# Patient Record
Sex: Female | Born: 1945 | Race: White | Hispanic: No | Marital: Married | Smoking: Never smoker
Health system: Southern US, Community
[De-identification: ages and names within clinical notes are randomized; demographics above are authoritative.]

## PROBLEM LIST (undated history)

## (undated) DIAGNOSIS — K589 Irritable bowel syndrome without diarrhea: Secondary | ICD-10-CM

## (undated) DIAGNOSIS — E785 Hyperlipidemia, unspecified: Secondary | ICD-10-CM

## (undated) DIAGNOSIS — F32A Depression, unspecified: Secondary | ICD-10-CM

## (undated) DIAGNOSIS — C801 Malignant (primary) neoplasm, unspecified: Secondary | ICD-10-CM

## (undated) DIAGNOSIS — F329 Major depressive disorder, single episode, unspecified: Secondary | ICD-10-CM

## (undated) DIAGNOSIS — M1990 Unspecified osteoarthritis, unspecified site: Secondary | ICD-10-CM

## (undated) DIAGNOSIS — T7840XA Allergy, unspecified, initial encounter: Secondary | ICD-10-CM

## (undated) DIAGNOSIS — K3184 Gastroparesis: Secondary | ICD-10-CM

## (undated) DIAGNOSIS — Z860100 Personal history of colon polyps, unspecified: Secondary | ICD-10-CM

## (undated) DIAGNOSIS — Z8249 Family history of ischemic heart disease and other diseases of the circulatory system: Secondary | ICD-10-CM

## (undated) DIAGNOSIS — F419 Anxiety disorder, unspecified: Secondary | ICD-10-CM

## (undated) DIAGNOSIS — K219 Gastro-esophageal reflux disease without esophagitis: Secondary | ICD-10-CM

## (undated) DIAGNOSIS — E7800 Pure hypercholesterolemia, unspecified: Secondary | ICD-10-CM

## (undated) HISTORY — DX: Allergy, unspecified, initial encounter: T78.40XA

## (undated) HISTORY — DX: Anxiety disorder, unspecified: F41.9

## (undated) HISTORY — DX: Gastro-esophageal reflux disease without esophagitis: K21.9

## (undated) HISTORY — PX: BREAST BIOPSY: SHX20

## (undated) HISTORY — DX: Personal history of colonic polyps: Z86.010

## (undated) HISTORY — DX: Unspecified osteoarthritis, unspecified site: M19.90

## (undated) HISTORY — DX: Depression, unspecified: F32.A

## (undated) HISTORY — PX: BREAST SURGERY: SHX581

## (undated) HISTORY — DX: Major depressive disorder, single episode, unspecified: F32.9

## (undated) HISTORY — DX: Personal history of colon polyps, unspecified: Z86.0100

## (undated) HISTORY — PX: OTHER SURGICAL HISTORY: SHX169

## (undated) HISTORY — PX: SKIN SURGERY: SHX2413

## (undated) HISTORY — DX: Irritable bowel syndrome, unspecified: K58.9

## (undated) HISTORY — PX: BREAST EXCISIONAL BIOPSY: SUR124

## (undated) HISTORY — DX: Malignant (primary) neoplasm, unspecified: C80.1

## (undated) HISTORY — DX: Family history of ischemic heart disease and other diseases of the circulatory system: Z82.49

## (undated) HISTORY — DX: Hyperlipidemia, unspecified: E78.5

## (undated) HISTORY — PX: SKIN CANCER EXCISION: SHX779

## (undated) HISTORY — DX: Gastroparesis: K31.84

## (undated) HISTORY — DX: Pure hypercholesterolemia, unspecified: E78.00

---

## 1983-02-10 HISTORY — PX: ABDOMINAL HYSTERECTOMY: SHX81

## 1998-04-01 ENCOUNTER — Other Ambulatory Visit: Payer: Self-pay | Admitting: Dermatology

## 1999-04-11 ENCOUNTER — Other Ambulatory Visit: Admission: RE | Admit: 1999-04-11 | Discharge: 1999-04-11 | Payer: Self-pay | Admitting: Obstetrics and Gynecology

## 2001-06-01 ENCOUNTER — Other Ambulatory Visit: Admission: RE | Admit: 2001-06-01 | Discharge: 2001-06-01 | Payer: Self-pay | Admitting: Obstetrics and Gynecology

## 2002-03-24 ENCOUNTER — Ambulatory Visit (HOSPITAL_COMMUNITY): Admission: RE | Admit: 2002-03-24 | Discharge: 2002-03-24 | Payer: Self-pay | Admitting: Pulmonary Disease

## 2002-03-24 ENCOUNTER — Encounter: Payer: Self-pay | Admitting: Pulmonary Disease

## 2004-03-11 ENCOUNTER — Ambulatory Visit: Payer: Self-pay | Admitting: Pulmonary Disease

## 2004-03-28 ENCOUNTER — Ambulatory Visit: Payer: Self-pay | Admitting: Gastroenterology

## 2004-04-11 ENCOUNTER — Ambulatory Visit: Payer: Self-pay | Admitting: Gastroenterology

## 2004-11-18 ENCOUNTER — Ambulatory Visit: Payer: Self-pay | Admitting: Pulmonary Disease

## 2005-07-24 ENCOUNTER — Ambulatory Visit: Payer: Self-pay | Admitting: Pulmonary Disease

## 2005-10-21 ENCOUNTER — Ambulatory Visit: Payer: Self-pay | Admitting: Pulmonary Disease

## 2005-11-26 ENCOUNTER — Encounter: Admission: RE | Admit: 2005-11-26 | Discharge: 2005-11-26 | Payer: Self-pay | Admitting: General Surgery

## 2006-04-13 ENCOUNTER — Ambulatory Visit: Payer: Self-pay | Admitting: Pulmonary Disease

## 2006-08-30 ENCOUNTER — Ambulatory Visit: Payer: Self-pay | Admitting: Pulmonary Disease

## 2006-11-29 ENCOUNTER — Encounter: Admission: RE | Admit: 2006-11-29 | Discharge: 2006-11-29 | Payer: Self-pay | Admitting: Obstetrics and Gynecology

## 2007-02-21 DIAGNOSIS — E785 Hyperlipidemia, unspecified: Secondary | ICD-10-CM | POA: Insufficient documentation

## 2007-02-21 DIAGNOSIS — K589 Irritable bowel syndrome without diarrhea: Secondary | ICD-10-CM | POA: Insufficient documentation

## 2007-02-21 DIAGNOSIS — F411 Generalized anxiety disorder: Secondary | ICD-10-CM | POA: Insufficient documentation

## 2007-02-21 DIAGNOSIS — M1990 Unspecified osteoarthritis, unspecified site: Secondary | ICD-10-CM | POA: Insufficient documentation

## 2007-02-22 ENCOUNTER — Ambulatory Visit: Payer: Self-pay | Admitting: Pulmonary Disease

## 2007-02-24 LAB — CONVERTED CEMR LAB
ALT: 28 units/L (ref 0–35)
AST: 28 units/L (ref 0–37)
Albumin: 4.2 g/dL (ref 3.5–5.2)
Alkaline Phosphatase: 57 units/L (ref 39–117)
BUN: 9 mg/dL (ref 6–23)
Basophils Absolute: 0 10*3/uL (ref 0.0–0.1)
Basophils Relative: 0 % (ref 0.0–1.0)
Bilirubin, Direct: 0.1 mg/dL (ref 0.0–0.3)
CO2: 30 meq/L (ref 19–32)
Calcium: 9.7 mg/dL (ref 8.4–10.5)
Chloride: 103 meq/L (ref 96–112)
Cholesterol: 168 mg/dL (ref 0–200)
Creatinine, Ser: 0.9 mg/dL (ref 0.4–1.2)
Eosinophils Absolute: 0.1 10*3/uL (ref 0.0–0.6)
Eosinophils Relative: 1.9 % (ref 0.0–5.0)
GFR calc Af Amer: 82 mL/min
GFR calc non Af Amer: 68 mL/min
Glucose, Bld: 110 mg/dL — ABNORMAL HIGH (ref 70–99)
HCT: 40.3 % (ref 36.0–46.0)
HDL: 49.3 mg/dL (ref >39.0)
Hemoglobin: 14.3 g/dL (ref 12.0–15.0)
LDL Cholesterol: 89 mg/dL (ref 0–99)
Lymphocytes Relative: 34.3 % (ref 12.0–46.0)
MCHC: 35.4 g/dL (ref 30.0–36.0)
MCV: 87.6 fL (ref 78.0–100.0)
Monocytes Absolute: 0.3 10*3/uL (ref 0.2–0.7)
Monocytes Relative: 6.1 % (ref 3.0–11.0)
Neutro Abs: 2.6 10*3/uL (ref 1.4–7.7)
Neutrophils Relative %: 57.7 % (ref 43.0–77.0)
Platelets: 197 10*3/uL (ref 150–400)
Potassium: 4.6 meq/L (ref 3.5–5.1)
RBC: 4.6 M/uL (ref 3.87–5.11)
RDW: 11.9 % (ref 11.5–14.6)
Sodium: 140 meq/L (ref 135–145)
TSH: 2.7 microintl units/mL (ref 0.35–5.50)
Total Bilirubin: 0.7 mg/dL (ref 0.3–1.2)
Total CHOL/HDL Ratio: 3.4
Total Protein: 7.4 g/dL (ref 6.0–8.3)
Triglycerides: 151 mg/dL — ABNORMAL HIGH (ref 0–149)
VLDL: 30 mg/dL (ref 0–40)
WBC: 4.6 10*3/uL (ref 4.5–10.5)

## 2007-02-25 ENCOUNTER — Ambulatory Visit (HOSPITAL_COMMUNITY): Admission: RE | Admit: 2007-02-25 | Discharge: 2007-02-25 | Payer: Self-pay | Admitting: Pulmonary Disease

## 2007-03-09 ENCOUNTER — Telehealth: Payer: Self-pay | Admitting: Pulmonary Disease

## 2007-03-16 ENCOUNTER — Ambulatory Visit: Payer: Self-pay | Admitting: Pulmonary Disease

## 2007-03-16 DIAGNOSIS — D126 Benign neoplasm of colon, unspecified: Secondary | ICD-10-CM | POA: Insufficient documentation

## 2007-03-16 DIAGNOSIS — K219 Gastro-esophageal reflux disease without esophagitis: Secondary | ICD-10-CM | POA: Insufficient documentation

## 2007-09-12 ENCOUNTER — Ambulatory Visit: Payer: Self-pay | Admitting: Pulmonary Disease

## 2007-09-12 DIAGNOSIS — J309 Allergic rhinitis, unspecified: Secondary | ICD-10-CM | POA: Insufficient documentation

## 2007-10-26 ENCOUNTER — Encounter: Payer: Self-pay | Admitting: Pulmonary Disease

## 2007-11-17 ENCOUNTER — Telehealth: Payer: Self-pay | Admitting: Pulmonary Disease

## 2007-11-18 ENCOUNTER — Ambulatory Visit: Payer: Self-pay | Admitting: Pulmonary Disease

## 2007-11-19 LAB — CONVERTED CEMR LAB
ALT: 25 units/L (ref 0–35)
Albumin: 4.4 g/dL (ref 3.5–5.2)
Alkaline Phosphatase: 56 units/L (ref 39–117)
BUN: 8 mg/dL (ref 6–23)
Basophils Relative: 0.7 % (ref 0.0–3.0)
Bilirubin, Direct: 0.1 mg/dL (ref 0.0–0.3)
CO2: 27 meq/L (ref 19–32)
Eosinophils Relative: 0.9 % (ref 0.0–5.0)
GFR calc Af Amer: 82 mL/min
HCT: 36.1 % (ref 36.0–46.0)
Hemoglobin: 12.2 g/dL (ref 12.0–15.0)
Lymphocytes Relative: 22.4 % (ref 12.0–46.0)
Monocytes Absolute: 0.2 10*3/uL (ref 0.1–1.0)
Monocytes Relative: 6.2 % (ref 3.0–12.0)
Platelets: 187 10*3/uL (ref 150–400)
RBC: 4.04 M/uL (ref 3.87–5.11)
Sed Rate: 16 mm/hr (ref 0–22)
Sodium: 144 meq/L (ref 135–145)
Total Protein: 7.2 g/dL (ref 6.0–8.3)

## 2007-12-05 ENCOUNTER — Encounter: Admission: RE | Admit: 2007-12-05 | Discharge: 2007-12-05 | Payer: Self-pay | Admitting: Obstetrics and Gynecology

## 2007-12-05 ENCOUNTER — Telehealth: Payer: Self-pay | Admitting: Pulmonary Disease

## 2007-12-07 ENCOUNTER — Telehealth: Payer: Self-pay | Admitting: Gastroenterology

## 2007-12-08 ENCOUNTER — Ambulatory Visit: Payer: Self-pay | Admitting: Internal Medicine

## 2007-12-12 ENCOUNTER — Ambulatory Visit: Payer: Self-pay | Admitting: Gastroenterology

## 2007-12-12 LAB — CONVERTED CEMR LAB
Basophils Relative: 1.1 % (ref 0.0–3.0)
HCT: 38.1 % (ref 36.0–46.0)
Lipase: 31 units/L (ref 11.0–59.0)
MCHC: 33.1 g/dL (ref 30.0–36.0)
MCV: 89.4 fL (ref 78.0–100.0)
Monocytes Absolute: 0.3 10*3/uL (ref 0.1–1.0)
Monocytes Relative: 5.7 % (ref 3.0–12.0)
Neutro Abs: 3.7 10*3/uL (ref 1.4–7.7)
RBC: 4.26 M/uL (ref 3.87–5.11)
RDW: 11.7 % (ref 11.5–14.6)

## 2007-12-14 ENCOUNTER — Ambulatory Visit (HOSPITAL_COMMUNITY): Admission: RE | Admit: 2007-12-14 | Discharge: 2007-12-14 | Payer: Self-pay | Admitting: Gastroenterology

## 2008-01-02 ENCOUNTER — Ambulatory Visit: Payer: Self-pay | Admitting: Gastroenterology

## 2008-01-02 DIAGNOSIS — K3184 Gastroparesis: Secondary | ICD-10-CM | POA: Insufficient documentation

## 2008-02-21 ENCOUNTER — Encounter: Payer: Self-pay | Admitting: Pulmonary Disease

## 2008-03-12 ENCOUNTER — Ambulatory Visit: Payer: Self-pay | Admitting: Pulmonary Disease

## 2008-03-18 LAB — CONVERTED CEMR LAB
HDL: 51.8 mg/dL (ref >39.0)
LDL Cholesterol: 97 mg/dL (ref 0–99)
TSH: 2.05 microintl units/mL (ref 0.35–5.50)

## 2008-07-03 ENCOUNTER — Telehealth (INDEPENDENT_AMBULATORY_CARE_PROVIDER_SITE_OTHER): Payer: Self-pay | Admitting: *Deleted

## 2008-10-11 ENCOUNTER — Encounter: Payer: Self-pay | Admitting: Pulmonary Disease

## 2008-11-13 ENCOUNTER — Encounter (INDEPENDENT_AMBULATORY_CARE_PROVIDER_SITE_OTHER): Payer: Self-pay | Admitting: *Deleted

## 2008-12-19 ENCOUNTER — Ambulatory Visit: Payer: Self-pay | Admitting: Gastroenterology

## 2008-12-24 ENCOUNTER — Encounter: Admission: RE | Admit: 2008-12-24 | Discharge: 2008-12-24 | Payer: Self-pay | Admitting: Obstetrics and Gynecology

## 2009-03-18 ENCOUNTER — Ambulatory Visit: Payer: Self-pay | Admitting: Pulmonary Disease

## 2009-03-19 LAB — CONVERTED CEMR LAB
Basophils Relative: 0.4 % (ref 0.0–3.0)
Bilirubin, Direct: 0.1 mg/dL (ref 0.0–0.3)
CO2: 32 meq/L (ref 19–32)
Eosinophils Relative: 1.9 % (ref 0.0–5.0)
GFR calc non Af Amer: 77.21 mL/min (ref >60)
Glucose, Bld: 88 mg/dL (ref 70–99)
Hemoglobin, Urine: NEGATIVE
LDL Cholesterol: 83 mg/dL (ref 0–99)
Lymphocytes Relative: 34.3 % (ref 12.0–46.0)
Lymphs Abs: 1.3 10*3/uL (ref 0.7–4.0)
Monocytes Absolute: 0.3 10*3/uL (ref 0.1–1.0)
Monocytes Relative: 8.3 % (ref 3.0–12.0)
Neutro Abs: 2 10*3/uL (ref 1.4–7.7)
RDW: 12.1 % (ref 11.5–14.6)
Sodium: 141 meq/L (ref 135–145)
Specific Gravity, Urine: 1.01 (ref 1.000–1.030)
TSH: 1.78 microintl units/mL (ref 0.35–5.50)
Total CHOL/HDL Ratio: 3
Total Protein, Urine: NEGATIVE mg/dL
VLDL: 16.2 mg/dL (ref 0.0–40.0)
Vit D, 25-Hydroxy: 39 ng/mL (ref 30–89)
WBC: 3.7 10*3/uL — ABNORMAL LOW (ref 4.5–10.5)

## 2009-04-22 ENCOUNTER — Encounter (INDEPENDENT_AMBULATORY_CARE_PROVIDER_SITE_OTHER): Payer: Self-pay | Admitting: *Deleted

## 2009-07-12 ENCOUNTER — Encounter (INDEPENDENT_AMBULATORY_CARE_PROVIDER_SITE_OTHER): Payer: Self-pay

## 2009-07-16 ENCOUNTER — Ambulatory Visit: Payer: Self-pay | Admitting: Gastroenterology

## 2009-09-04 ENCOUNTER — Ambulatory Visit: Payer: Self-pay | Admitting: Gastroenterology

## 2009-09-07 ENCOUNTER — Encounter: Payer: Self-pay | Admitting: Gastroenterology

## 2009-09-09 ENCOUNTER — Telehealth: Payer: Self-pay | Admitting: Gastroenterology

## 2009-09-27 ENCOUNTER — Encounter: Payer: Self-pay | Admitting: Pulmonary Disease

## 2009-11-12 ENCOUNTER — Telehealth (INDEPENDENT_AMBULATORY_CARE_PROVIDER_SITE_OTHER): Payer: Self-pay | Admitting: *Deleted

## 2009-12-16 ENCOUNTER — Ambulatory Visit: Payer: Self-pay | Admitting: Pulmonary Disease

## 2009-12-25 ENCOUNTER — Telehealth (INDEPENDENT_AMBULATORY_CARE_PROVIDER_SITE_OTHER): Payer: Self-pay | Admitting: *Deleted

## 2009-12-26 ENCOUNTER — Encounter: Admission: RE | Admit: 2009-12-26 | Discharge: 2009-12-26 | Payer: Self-pay | Admitting: Obstetrics and Gynecology

## 2010-03-11 NOTE — Procedures (Signed)
Summary: Colonoscopy  Patient: Tabitha Carlson Note: All result statuses are Final unless otherwise noted.  Tests: (1) Colonoscopy (COL)   COL Colonoscopy           DONE     [facility]     [address]     [place], [place]  [address]           COLONOSCOPY PROCEDURE REPORT           PATIENT:  Tabitha Carlson, Tabitha Carlson  MR#:  [id number]     BIRTHDATE:  1946-01-20, 63 yrs. old  GENDER:  female     ENDOSCOPIST:  Judie Petit T. Russella Dar, MD, [facility]           PROCEDURE DATE:  09/04/2009     PROCEDURE:  Colonoscopy with snare polypectomy     ASA CLASS:  Class II     INDICATIONS:  1) follow-up of polyp  2) surveillance and high-risk     screening, adenomatous polyp, 04/2004.     MEDICATIONS:   Fentanyl 75 mcg IV, Versed 8 mg IV     DESCRIPTION OF PROCEDURE:   After the risks benefits and     alternatives of the procedure were thoroughly explained, informed     consent was obtained.  Digital rectal exam was performed and     revealed no abnormalities.   The LB [id number]-[id number] endoscope     was introduced through the anus and advanced to the cecum, which     was identified by both the appendix and ileocecal valve, without     limitations.  The quality of the prep was excellent, using     MoviPrep.  The instrument was then slowly withdrawn as the colon     was fully examined.     <<PROCEDUREIMAGES>>     FINDINGS:  Two polyps were found in the rectum. They were 4 mm in     size. Polyps were snared without cautery. Retrieval was     successful. Mild diverticulosis was found in the sigmoid colon. A     normal appearing cecum, ileocecal valve, and appendiceal orifice     were identified. The ascending, hepatic flexure, transverse,     splenic flexure, descending colon appeared unremarkable.     Retroflexed views in the rectum revealed internal hemorrhoids,     small.  The time to cecum =  3.33  minutes. The scope was then     withdrawn (time =  10.33  min) from the patient and the procedure  completed.           COMPLICATIONS:  None           ENDOSCOPIC IMPRESSION:     1) 4 mm, two polyps in the rectum     2) Mild diverticulosis in the sigmoid colon     3) Internal hemorrhoids           RECOMMENDATIONS:     1) Await pathology results     2) High fiber diet with liberal fluid intake.     3) Repeat Colonoscopy in 5 years pending pathology review           Johntae Broxterman T. Russella Dar, MD, [facility]           CC: Michele Mcalpine, MD           n.     Rosalie DoctorVenita Lick. Taran Hable at 09/04/2009 11:25 AM  Tabitha Carlson, Tabitha Carlson, [id number]  Note: An exclamation mark (!) indicates a result that was not dispersed into the flowsheet. Document Creation Date: 09/04/2009 11:26 AM _______________________________________________________________________  (1) Order result status: Final Collection or observation date-time: 09/04/2009 11:21 Requested date-time:  Receipt date-time:  Reported date-time:  Referring Physician:   Ordering Physician: Claudette Head [phone]) Specimen Source:  Source: Launa Grill Order Number: [id number] Lab site:   Appended Document: Colonoscopy     Procedures Next Due Date:    Colonoscopy: 08/2014

## 2010-03-11 NOTE — Progress Notes (Signed)
Summary: ear pressure  Phone Note Call from Patient Call back at Home Phone [phone]   Caller: Patient Call For: tammy parrett Summary of Call: pt has completed abx. ears no longer in pain but still stopped up "off and on" and echoes. no fever. pls advise. [facility] [address]  Initial call taken by: Tivis Ringer, CNA,  December 25, 2009 3:40 PM  Follow-up for Phone Call        Spoke with Lennon Alstrom she okayed to use block spot with TP-pt is still having ear pain and recently requested refill on abx-OV scheduled for Tomorrow at 11am with TP.Reynaldo Minium CMA  December 25, 2009 3:48 PM      Appended Document: ear pressure per SN, no appt needed.  may have medrol dose pak.  LMOM TCB to inform pt of this.    Appended Document: ear pressure > ok for medrol dose pak Medications Added MEDROL (PAK) 4 MG TABS (METHYLPREDNISOLONE) take as directed       pt never returned call.  arrived at office for appt.  spoke with patient - finished abx 4 days ago, still having right ear stuffiness "off and on" and occ muffled hearing, no pain - advised of SN's recs of the medrol dose pak d/t inflammation in the ear.  pt okay with this an verbalized her understanding.  rx sent to [facility].   Clinical Lists Changes  Medications: Added new medication of MEDROL (PAK) 4 MG TABS (METHYLPREDNISOLONE) take as directed - Signed Rx of MEDROL (PAK) 4 MG TABS (METHYLPREDNISOLONE) take as directed;  #1 x 0;  Signed;  Entered by: Boone Master CNA/MA;  Authorized by: Michele Mcalpine MD;  Method [facility]: [facility] [phone]*, [address] [place], [place], [place]  [address], Ph: [phone], Fax: [phone]    Prescriptions: MEDROL (PAK) 4 MG TABS (METHYLPREDNISOLONE) take as directed  #1 x 0   Entered by:   Boone Master CNA/MA   Authorized by:   Michele Mcalpine MD   Signed by:   Boone Master CNA/MA on 12/26/2009   Method used:   [facility]        [facility]  [address]  [phone]* (retail)       [address]       [place], [place]  [address]       Ph: [phone]       Fax: [phone]   RxID:   [id number]

## 2010-03-11 NOTE — Progress Notes (Signed)
Summary: med request   Phone Note Call from Patient Call back at Home Phone [phone]   Caller: Patient Call For: Dr. Russella Dar Reason for Call: Talk to Nurse Summary of Call: pt experiencing some "discomfort" after BM's and wants to know if Dr. Russella Dar could call her in an rx to "soothe" her rectum... [facility] [address] Initial call taken by: Vallarie Mare,  September 09, 2009 12:25 PM  Follow-up for Phone Call         Denies pain , rectal bleeding, describes as "irritated".  Patient  advised to try sitz bath two times a day and if still has burning she can try Prep H.  She is asked to call back if her symptoms don't improve. Follow-up by: Darcey Nora RN, CGRN,  September 09, 2009 1:48 PM

## 2010-03-11 NOTE — Letter (Signed)
Summary: [facility] Instructions  [facility]  [address] [place], [place] [address]   Phone: [phone]  Fax: [phone]       Tabitha Carlson    1945/08/29    MRN: [id number]        Procedure Day Dorna Bloom:  Jake Shark  07/30/09     Arrival Time:  9:00AM     Procedure Time:  10:00AM     Location of Procedure:                    _X _  [facility] (4th Floor)                        PREPARATION FOR COLONOSCOPY WITH MOVIPREP   Starting 5 days prior to your procedure 07/25/09 do not eat nuts, seeds, popcorn, corn, beans, peas,  salads, or any raw vegetables.  Do not take any fiber supplements (e.g. Metamucil, Citrucel, and Benefiber).  THE DAY BEFORE YOUR PROCEDURE         DATE: 07/29/09  DAY: MONDAY  1.  Drink clear liquids the entire day-NO SOLID FOOD  2.  Do not drink anything colored red or purple.  Avoid juices with pulp.  No orange juice.  3.  Drink at least 64 oz. (8 glasses) of fluid/clear liquids during the day to prevent dehydration and help the prep work efficiently.  CLEAR LIQUIDS INCLUDE: Water Jello Ice Popsicles Tea (sugar ok, no milk/cream) Powdered fruit flavored drinks Coffee (sugar ok, no milk/cream) Gatorade Juice: apple, white grape, white cranberry  Lemonade Clear bullion, consomm, broth Carbonated beverages (any kind) Strained chicken noodle soup Hard Candy                             4.  In the morning, mix first dose of MoviPrep solution:    Empty 1 Pouch A and 1 Pouch B into the disposable container    Add lukewarm drinking water to the top line of the container. Mix to dissolve    Refrigerate (mixed solution should be used within 24 hrs)  5.  Begin drinking the prep at 5:00 p.m. The MoviPrep container is divided by 4 marks.   Every 15 minutes drink the solution down to the next mark (approximately 8 oz) until the full liter is complete.   6.  Follow completed prep with 16 oz of clear liquid of your choice (Nothing  red or purple).  Continue to drink clear liquids until bedtime.  7.  Before going to bed, mix second dose of MoviPrep solution:    Empty 1 Pouch A and 1 Pouch B into the disposable container    Add lukewarm drinking water to the top line of the container. Mix to dissolve    Refrigerate  THE DAY OF YOUR PROCEDURE      DATE: 07/30/09  DAY: TUESDAY  Beginning at 5:00AM (5 hours before procedure):         1. Every 15 minutes, drink the solution down to the next mark (approx 8 oz) until the full liter is complete.  2. Follow completed prep with 16 oz. of clear liquid of your choice.    3. You may drink clear liquids until 8;00AM (2 HOURS BEFORE PROCEDURE).   MEDICATION INSTRUCTIONS  Unless otherwise instructed, you should take regular prescription medications with a small sip of water   as early as possible the morning  of your procedure.         OTHER INSTRUCTIONS  You will need a responsible adult at least 64 years of age to accompany you and drive you home.   This person must remain in the waiting room during your procedure.  Wear loose fitting clothing that is easily removed.  Leave jewelry and other valuables at home.  However, you may wish to bring a book to read or  an iPod/MP3 player to listen to music as you wait for your procedure to start.  Remove all body piercing jewelry and leave at home.  Total time from sign-in until discharge is approximately 2-3 hours.  You should go home directly after your procedure and rest.  You can resume normal activities the  day after your procedure.  The day of your procedure you should not:   Drive   Make legal decisions   Operate machinery   Drink alcohol   Return to work  You will receive specific instructions about eating, activities and medications before you leave.    The above instructions have been reviewed and explained to me by   Ulis Rias RN  July 16, 2009 4:00 PM     I fully understand and can  verbalize these instructions _____________________________ Date _________

## 2010-03-11 NOTE — Letter (Signed)
Summary: Patient Notice-Hyperplastic Polyps  [facility]  [address] [place], [place] [address]   Phone: [phone]  Fax: [phone]        September 07, 2009 MRN: [id number]    [facility] [address] [place], [place]  [address]    Dear Ms. 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 These types of polyps are NOT pre-cancerous.  It is my recommendation that you have a repeat colonoscopy examination in 5 years.  Should you develop new or worsening symptoms of abdominal pain, bowel habit changes or bleeding from the rectum or bowels, please schedule an evaluation with either your primary care physician or with me.  Continue treatment plan as outlined the day of your exam.  Please call us if you are having persistent problems or have questions about your condition that have not been fully answered at this time.  Sincerely,  Meryl Dare MD [facility]  This letter has been electronically signed by your physician.  Appended Document: Patient Notice-Hyperplastic Polyps Letter mailed 8.3.2011

## 2010-03-11 NOTE — Progress Notes (Signed)
Summary: sinus infection---rx for augmentin    Phone Note Call from Patient Call back at [phone]   Caller: Patient Call For: nadel Summary of Call: C/o sinus infection x 3 days, wants an antibiotic called in, pls advise.//[facility] [address] Initial call taken by: Darletta Moll,  November 12, 2009 10:59 AM  Follow-up for Phone Call        called and spoke with pt.  pt states she think she has a sinus infection.  Pt states symptoms started last Wednesday and haven't improved.  Pt c/o headache across front of head,  pressure behind eyes, thick "gold" nasal drainage with streaks of blood in it, watery eyes.  Pt denied chest congestion or fever.  Pt states she is using nasonex, saline spray and tylenol with no relief of symptoms. Please advise.  Thank you.  Aundra Millet Reynolds LPN  November 12, 2009 12:12 PM  allergies: asa   Additional Follow-up for Phone Call Additional follow up Details #1::        per SN, ok for Augmentin 875mg   # 14 x 0 refills.  1 by mouth two times a day.   OTC Mucinex 2 by mouth two times a day with fluids,  and saline nasal spray as needed.    LMOM to inform pt of SN's recs. Marland Kitchen Marland KitchenArman Filter LPN  November 12, 2009 2:20 PM     Additional Follow-up for Phone Call Additional follow up Details #2::    pt returned call. asked to be called back on her home phone. Tivis Ringer, CNA  November 12, 2009 2:31 PM  called pt back.  pt aware of SN's recs and rx sent to pharmacy.  Aundra Millet Reynolds LPN  November 12, 2009 2:42 PM   New/Updated Medications: AUGMENTIN 875-125 MG TABS (AMOXICILLIN-POT CLAVULANATE) Take 1 tablet by mouth two times a day Prescriptions: AUGMENTIN 875-125 MG TABS (AMOXICILLIN-POT CLAVULANATE) Take 1 tablet by mouth two times a day  #14 x 0   Entered by:   Arman Filter LPN   Authorized by:   Michele Mcalpine MD   Signed by:   Arman Filter LPN on 11/91/4782   Method used:   [facility]        [facility]  [address] [phone]* (retail)       [address]       [place], [place]  [address]       Ph: [phone]       Fax: [phone]   RxID:   [id number]

## 2010-03-11 NOTE — Assessment & Plan Note (Signed)
Summary: CPX/ FASTING/ MBW   Primary Care Provider:  Alroy Dust MD   CC:  Yearly ROV & CPX....  History of Present Illness: [name] here for a follow up visit... she has multiple medical problems as noted below...     ~  March 12, 2008:  she returns w/ gastroparesis symptoms much improved w/ dietary adjustment & lost 6# in the process... feels well, no new complaints or concerns... she's had both Flu shots this yr and requests refills for Protonix and Lipitor...   ~  March 18, 2009:  she's had a good year- GI symptoms remain controlled w/ Protonix & diet regimen...  stable on Lipitor20 & low fat diet...  c/o post nasal drip- using saline + nasonex, rec adding Mucinex + Astelin... she had the 2010 Flu vaccine in Sep10.    Current Problem List:  ALLERGIC RHINITIS (ICD-477.9) - on NASONEX Prn + OTC meds as needed... we discussed using an antihist (eg- Zyrtek) Qam, saline during the day, and Nasonex Qhs... trial of Astepro for drip.  FAMILY HISTORY OF ISCHEMIC HEART DISEASE (ICD-V17.3) - takes ASA 1/d... NuclearStressTest negative in 2/05- no ischemia & EF=71%... she is active w/ Yoga, treadmill, grandkids etc- denies HA, fatigue, visual changes, CP, palipit, dizziness, syncope, dyspnea, edema, etc...   HYPERLIPIDEMIA (ICD-272.4) - on LIPITOR 20mg /d and tol well...   ~  FLP 02/22/07 showed TChol 168, TG 151, HDL 49, LDL 89  ~  FLP 2/10 showed TChol 173, TG 122, HDL 52, LDL 97  ~  FLP 2/11 showed TChol 158, TG 81, HDL 59, LDL 83  GERD (ICD-530.81) & GASTROPARESIS - on PROTONIX 40mg /d & Phenergan Prn (she gets occas nausea)...  ~  10/09 eval by DrStark w/ EGD= normal, and Gastric Emptying Scan +for gastroparesis... Rx w/ dietary adjustment and improved...  ~  11/10: she had f/u DrStark- stable, continue Protonix Rx...  IBS (ICD-564.1) & COLONIC POLYPS (ICD-211.3) - last colonoscopy 3/06 by DrStark showed several 2-28mm polyps (path=tubular adenoms) f/u planned 17yrs & due  3/11...  DEGENERATIVE JOINT DISEASE (ICD-715.90) - uses ADVIL OTC w/ some relief... her right shoulder pain improved after she changed mattresses...  ANXIETY (ICD-300.00) - on ALPRAZOLAM 0.5mg  as needed.  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  HEALTH MAINTENANCE:  GYN= DrRomaine and up-to-date on the needed tests... PAP s/p hyst (done Oct09), [facility]), & BMD ([facility] & last in ?2007- reported WNL)... she takes calcium gummies (intol to caltrate etc), MVI, & Vit D 1000 u daily...      Allergies: 1)  ! * Asprin  Comments:  Nurse/Medical Assistant: The patient's medications and allergies were reviewed with the patient and were updated in the Medication and Allergy Lists.  Past History:  Past Medical History:  Hx of ALLERGIC RHINITIS (ICD-477.9) FAMILY HISTORY OF ISCHEMIC HEART DISEASE (ICD-V17.3) HYPERLIPIDEMIA (ICD-272.4) GERD (ICD-530.81) GASTROPARESIS (ICD-536.3) IBS (ICD-564.1) COLONIC POLYPS (ICD-211.3) DEGENERATIVE JOINT DISEASE (ICD-715.90) ANXIETY (ICD-300.00) BLOOD DONOR, OTHER (ICD-V59.09)  Past Surgical History: S/P Hysterectomy S/P left breast biopsy- benign x 2  Family History: Reviewed history from 01/02/2008 and no changes required. mother  died age 85  w/ MI father died age 34 in a MVA 3Sibs- 1Bro died age 78 drowned  2Sis are well Family History of Breast Cancer: Maternal Aunt x 2 Family History of Esophageal Cancer: Maternal Uncle  Social History: Reviewed history from 01/02/2008 and no  changes required. 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  Alcohol Use - no Daily Caffeine Use-1 drink daily Illicit Drug Use - no Patient gets regular exercise.  Review of Systems       The patient complains of nasal congestion and hay fever.  The patient denies fever, chills, sweats, anorexia, fatigue, weakness, malaise, weight loss, sleep  disorder, blurring, diplopia, eye irritation, eye discharge, vision loss, eye pain, photophobia, earache, ear discharge, tinnitus, decreased hearing, nosebleeds, sore throat, hoarseness, chest pain, palpitations, syncope, dyspnea on exertion, orthopnea, PND, peripheral edema, cough, dyspnea at rest, excessive sputum, hemoptysis, wheezing, pleurisy, nausea, vomiting, diarrhea, constipation, change in bowel habits, abdominal pain, melena, hematochezia, jaundice, gas/bloating, indigestion/heartburn, dysphagia, odynophagia, dysuria, hematuria, urinary frequency, urinary hesitancy, nocturia, incontinence, back pain, joint pain, joint swelling, muscle cramps, muscle weakness, stiffness, arthritis, sciatica, restless legs, leg pain at night, leg pain with exertion, rash, itching, dryness, suspicious lesions, paralysis, paresthesias, seizures, tremors, vertigo, transient blindness, frequent falls, frequent headaches, difficulty walking, depression, anxiety, memory loss, confusion, cold intolerance, heat intolerance, polydipsia, polyphagia, polyuria, unusual weight change, abnormal bruising, bleeding, enlarged lymph nodes, urticaria, allergic rash, and recurrent infections.    Vital Signs:  Patient profile:   65 year old female Height:      66 inches Weight:      148 pounds O2 Sat:      99 % on Room air Temp:     96.8 degrees F oral Pulse rate:   68 / minute BP sitting:   122 / 78  (left arm) Cuff size:   regular  Vitals Entered By: Randell Loop CMA (March 18, 2009 8:58 AM)  O2 Sat at Rest %:  99 O2 Flow:  Room air  Physical Exam  Additional Exam:  WD, WN, [name] in NAD... GENERAL:  Alert & oriented; pleasant & cooperative... HEENT:  [place]/AT, EOM-wnl, PERRLA, sl left ptosis, EACs-clear, TMs-wnl, NOSE-clear, THROAT-clear & wnl. NECK:  Supple w/ full ROM; no JVD; normal carotid impulses w/o bruits; no thyromegaly or nodules palpated; no lymphadenopathy. CHEST:  Clear to P & A; without wheezes/  rales/ or rhonchi. HEART:  Regular Rhythm; without murmurs/ rubs/ or gallops. ABDOMEN:  Soft & non-tender; normal bowel sounds; no organomegaly or masses detected. EXT: without deformities, min arthritic changes; no varicose veins/ venous insuffic/ or edema. NEURO:  CN's intact; no focal neuro deficits... DERM:  No lesions noted; no rash, no petechiae etc...    CXR  Procedure date:  03/18/2009  Findings:      CHEST - 2 VIEW Comparison: 03/16/2007   Findings: Mild peribronchial thickening, similar to prior study. Heart and mediastinal contours are within normal limits.  No focal opacities or effusions.  No acute bony abnormality.   IMPRESSION: No acute findings or change.   Read By:  Charlett Nose,  M.D.   EKG  Procedure date:  03/18/2009  Findings:      Normal sinus rhythm with rate of:  78/ min... Tracing is WNL, NAD...  SN   MISC. Report  Procedure date:  03/18/2009  Findings:      Lipid Panel (LIPID)   Cholesterol               158 mg/dL                   1-610   Triglycerides             81.0 mg/dL  0.0-149.0   HDL                       04.54 mg/dL                 >09.81   LDL Cholesterol           83 mg/dL                    1-91  BMP (METABOL)   Sodium                    141 mEq/L                   135-145   Potassium                 4.2 mEq/L                   3.5-5.1   Chloride                  105 mEq/L                   96-112   Carbon Dioxide            32 mEq/L                    19-32   Glucose                   88 mg/dL                    47-82   BUN                       11 mg/dL                    9-56   Creatinine                0.8 mg/dL                   2.1-3.0   Calcium                   9.1 mg/dL                   8.6-57.8   GFR                       77.21 mL/min                >60  Hepatic/Liver Function Panel (HEPATIC)   Total Bilirubin           0.3 mg/dL                   4.6-9.6   Direct Bilirubin          0.1 mg/dL                    2.9-5.2   Alkaline Phosphatase      55 U/L                      39-117   AST                       27 U/L  0-37   ALT                       25 U/L                      0-35   Total Protein             7.0 g/dL                    5.7-8.4   Albumin                   4.1 g/dL                    6.9-6.2  Comments:      CBC Platelet w/Diff (CBCD)   White Cell Count     [L]  3.7 K/uL                    4.5-10.5   Red Cell Count            4.24 Mil/uL                 3.87-5.11   Hemoglobin                13.1 g/dL                   95.2-84.1   Hematocrit                38.8 %                      36.0-46.0   MCV                       91.6 fl                     78.0-100.0   Platelet Count            173.0 K/uL                  150.0-400.0   Neutrophil %              55.1 %                      43.0-77.0   Lymphocyte %              34.3 %                      12.0-46.0   Monocyte %                8.3 %                       3.0-12.0   Eosinophils%              1.9 %                       0.0-5.0   Basophils %               0.4 %                       0.0-3.0  TSH (TSH)   FastTSH  1.78 uIU/mL                 0.35-5.50  UDip w/Micro (URINE)   Color                     LT. YELLOW   Clarity                   CLEAR                       Clear   Specific Gravity          1.010                       1.000 - 1.030   Urine Ph                  6.5                         5.0-8.0   Protein                   NEGATIVE                    Negative   Urine Glucose             NEGATIVE                    Negative   Ketones                   NEGATIVE                    Negative   Urine Bilirubin           NEGATIVE                    Negative   Blood                     NEGATIVE                    Negative  Urobilinogen              0.2                         0.0 - 1.0   Leukocyte Esterace        NEGATIVE                    Negative   Nitrite                    NEGATIVE                    Negative   Urine Epith               Rare(0-4/hpf)               Rare(0-4/hpf)   Urine Bacteria            Rare(<10/hpf)               None  Vitamin D (25-Hydroxy) (14782)  Vitamin D (25-Hydroxy)                             39  ng/mL                    30-89   Impression & Recommendations:  Problem # 1:  PHYSICAL EXAMINATION (ICD-V70.0) Good general health... Orders: EKG w/ Interpretation (93000) T-2 View CXR (71020TC) TLB-Lipid Panel (80061-LIPID) TLB-BMP (Basic Metabolic Panel-BMET) (80048-METABOL) TLB-Hepatic/Liver Function Pnl (80076-HEPATIC) TLB-CBC Platelet - w/Differential (85025-CBCD) TLB-TSH (Thyroid Stimulating Hormone) (84443-TSH) TLB-Udip w/ Micro (81001-URINE) T-Vitamin D (25-Hydroxy) (69629-52841)  Problem # 2:  Hx of ALLERGIC RHINITIS (ICD-477.9) Add Astepro trial for drip... Her updated medication list for this problem includes:    Nasonex 50 Mcg/act Susp (Mometasone furoate) ..... Use one spray in each nostril two times a day    Astepro 0.15 % Soln (Azelastine hcl) .Marland Kitchen... 2 sp in each nostril two times a day as directed...    Promethazine Hcl 25 Mg Tabs (Promethazine hcl) .Marland Kitchen... Take 1/2 to 1 tab by mouth q 4 h as needed for nausea...  Problem # 3:  HYPERLIPIDEMIA (ICD-272.4) Doing satis on the Lip20... Her updated medication list for this problem includes:    Lipitor 20 Mg Tabs (Atorvastatin calcium) .Marland Kitchen... Take 1 tablet by mouth once a day  Problem # 4:  GERD (ICD-530.81) She has GERD & GASTROPARESIS- on diet + Protonix & doing well-  continue same,. Her updated medication list for this problem includes:    Protonix 40 Mg Tbec (Pantoprazole sodium) .Marland Kitchen... 1 tablet  once a day 30 min before meals  Problem # 5:  COLONIC POLYPS (ICD-211.3) Due for colon from DrStark 3/11...  Problem # 6:  DEGENERATIVE JOINT DISEASE (ICD-715.90) She uses Advil Prn... Her updated medication list for this problem includes:    Advil 200 Mg Tabs  (Ibuprofen) .Marland Kitchen... As needed  Problem # 7:  OTHER MEDICAL PROBLEMS AS NOTED>>>  Complete Medication List: 1)  Nasonex 50 Mcg/act Susp (Mometasone furoate) .... Use one spray in each nostril two times a day 2)  Astepro 0.15 % Soln (Azelastine hcl) .... 2 sp in each nostril two times a day as directed... 3)  Lipitor 20 Mg Tabs (Atorvastatin calcium) .... Take 1 tablet by mouth once a day 4)  Protonix 40 Mg Tbec (Pantoprazole sodium) .Marland Kitchen.. 1 tablet  once a day 30 min before meals 5)  Promethazine Hcl 25 Mg Tabs (Promethazine hcl) .... Take 1/2 to 1 tab by mouth q 4 h as needed for nausea.Marland KitchenMarland Kitchen 6)  Climara 0.05 Mg/24hr Ptwk (Estradiol) .... Use once a week 7)  Advil 200 Mg Tabs (Ibuprofen) .... As needed 8)  Calcium 600 1500 Mg Tabs (Calcium carbonate) .... Take 1 tablet by mouth once a day 9)  Multivitamins Tabs (Multiple vitamin) .... Take 1 tablet by mouth once a day 10)  Alprazolam 0.5 Mg Tabs (Alprazolam) .... Take 1/2 to 1 tab by mouth three times a day as needed for nerves... 11)  Viactiv 500-500-40 Mg-unt-mcg Chew (Calcium-vitamin d-vitamin k) .Marland Kitchen.. 1 by mouth two times a day 12)  Stool Softner  .Marland Kitchen.. 1 by mouth two times a day 13)  Activia  .... Once daily  Patient Instructions: 1)  Today we updated your med list- see below.... 2)  We refilled your meds for 2011... 3)  For your post-nasal drip:  try the OTC Claritin or Zyrtek in the AM;  use the ASTEPRO (sample) 2 sp in each nostril twice daily;  use the [facility] MAX one tab two times a day w/ fluids;  and continue your Saline & Nasonex... whew!*?!* 4)  Today we did your follow up CXR, EKG, & fasting blood work... please call the "phone tree" in a few days for your lab results.Marland KitchenMarland Kitchen 5)  Keep up the great job w/ diet + exercise!!! 6)  Call for any problems.Marland KitchenMarland Kitchen 7)  Please schedule a follow-up appointment in 1 year. Prescriptions: ASTEPRO 0.15 % SOLN (AZELASTINE HCL) 2 sp in each nostril two times a day as directed...  #1 x prn   Entered and  Authorized by:   Michele Mcalpine MD   Signed by:   Michele Mcalpine MD on 03/18/2009   Method used:   Print then Give to Patient   RxID:   [id number] ALPRAZOLAM 0.5 MG  TABS (ALPRAZOLAM) Take 1/2 to 1 tab by mouth three times a day as needed for nerves...  #90 x prn   Entered and Authorized by:   Michele Mcalpine MD   Signed by:   Michele Mcalpine MD on 03/18/2009   Method used:   Print then Give to Patient   RxID:   [id number] PROTONIX 40 MG  TBEC (PANTOPRAZOLE SODIUM) 1 tablet  once a day 30 min before meals  #30 x prn   Entered and Authorized by:   Michele Mcalpine MD   Signed by:   Michele Mcalpine MD on 03/18/2009   Method used:   Print then Give to Patient   RxID:   [id number] LIPITOR 20 MG TABS (ATORVASTATIN CALCIUM) Take 1 tablet by mouth once a day  #30 x prn   Entered and Authorized by:   Michele Mcalpine MD   Signed by:   Michele Mcalpine MD on 03/18/2009   Method used:   Print then Give to Patient   RxID:   [id number] NASONEX 50 MCG/ACT  SUSP (MOMETASONE FUROATE) use one spray in each nostril two times a day  #1 x prn   Entered and Authorized by:   Michele Mcalpine MD   Signed by:   Michele Mcalpine MD on 03/18/2009   Method used:   Print then Give to Patient   RxID:   [id number]    CardioPerfect ECG  ID: [id number] Patient: Tabitha Carlson, Tabitha Carlson [facility] DOB: 11-05-1945 Age: 65 Years Old Sex: Female Race: White Physician: nadel,s Technician: Randell Loop CMA Height: 66 Weight: 148 Status: Unconfirmed Past Medical History:  Hx of ALLERGIC RHINITIS (ICD-477.9) FAMILY HISTORY OF ISCHEMIC HEART DISEASE (ICD-V17.3) HYPERLIPIDEMIA (ICD-272.4) GERD (ICD-530.81) IBS (ICD-564.1) COLONIC POLYPS (ICD-211.3)/ADENOMATOUS DEGENERATIVE JOINT DISEASE (ICD-715.90) ANXIETY (ICD-300.00) GASTROPARESIS  Recorded: 03/18/2009 09:16 AM P/PR: 107 ms / 165 ms - Heart rate (maximum exercise) QRS: 80 QT/QTc/QTd: 380 ms / 412 ms / 51 ms - Heart rate (maximum exercise)  P/QRS/T axis: 82 deg  / 76 deg / 69 deg - Heart rate (maximum exercise)  Heartrate: 78 bpm  Interpretation:  Normal sinus rhythm with rate of:  78/ min... Tracing is WNL, NAD...  SN

## 2010-03-11 NOTE — Miscellaneous (Signed)
Summary: Flu Vaccination/[facility]  Flu Vaccination/[facility]   Imported By: Sherian Rein 10/03/2009 07:48:41  _____________________________________________________________________  External Attachment:    Type:   Image     Comment:   External Document

## 2010-03-11 NOTE — Miscellaneous (Signed)
Summary: Lec previsit  Clinical Lists Changes  Medications: Added new medication of MOVIPREP 100 GM  SOLR (PEG-KCL-NACL-NASULF-NA ASC-C) As per prep instructions. - Signed Rx of MOVIPREP 100 GM  SOLR (PEG-KCL-NACL-NASULF-NA ASC-C) As per prep instructions.;  #1 x 0;  Signed;  Entered by: Ulis Rias RN;  Authorized by: Meryl Dare MD [facility];  Method used: [id number] [address], [place], [place], [place]  [address], Ph: [phone], Fax: [phone]    Prescriptions: MOVIPREP 100 GM  SOLR (PEG-KCL-NACL-NASULF-NA ASC-C) As per prep instructions.  #1 x 0   Entered by:   Ulis Rias RN   Authorized by:   Meryl Dare MD [facility]   Signed by:   Ulis Rias RN on 07/16/2009   Method used:   [facility] [id number]       [address]       [place], [place]  [address]       Ph: [phone]       Fax: [phone]   RxID:   [id number]

## 2010-03-11 NOTE — Letter (Signed)
Summary: Colonoscopy Letter  [facility]  [address] [place], [place] [address]   Phone: [phone]  Fax: [phone]      April 22, 2009 MRN: [id number]   [facility] [address] [place], [place]  [address]   Dear Ms. Bednarczyk,   According to your medical record, it is time for you to schedule a Colonoscopy. The American Cancer Society recommends this procedure as a method to detect early colon cancer. Patients with a family history of colon cancer, or a personal history of colon polyps or inflammatory bowel disease are at increased risk.  This letter has beeen generated based on the recommendations made at the time of your procedure. If you feel that in your particular situation this may no longer apply, please contact our office.  Please call our office at [phone] to schedule this appointment or to update your records at your earliest convenience.  Thank you for cooperating with [place] to provide you with the very best care possible.   Sincerely,  Judie Petit T. Russella Dar, M.D.  [facility] [phone]

## 2010-03-11 NOTE — Assessment & Plan Note (Signed)
Summary: Acute NP office visit - right ear pain   Copy to:  n/a Primary Provider/Referring Provider:  Alroy Dust MD   CC:  right ear discomfort and muffled hearing x1week and had sinus infection 1 month and still having sinus pressure with some pinkish nasal drainage.Marland Kitchen  History of Present Illness: 65 yo with known hx of Hyperlipidemia.   December 16, 2009 --Presents for an acute office visit. Complains of right ear discomfort and muffled hearing x1week, had sinus infection 1 month and still having sinus pressure with some pinkish nasal drainage. Recently returned from flight from [facility]. Ear pain is getting worse. Had sinus infection  ~6 weeks ago, resolved w/ augmentin. Denies chest pain, dyspnea, orthopnea, hemoptysis, fever, n/v/d, edema, headache.   Medications Prior to Update: 1)  Nasonex 50 Mcg/act  Susp (Mometasone Furoate) .... Use One Spray in Each Nostril Two Times A Day 2)  Astepro 0.15 % Soln (Azelastine Hcl) .... 2 Sp in Each Nostril Two Times A Day As Directed... 3)  Lipitor 20 Mg Tabs (Atorvastatin Calcium) .... Take 1 Tablet By Mouth Once A Day 4)  Protonix 40 Mg  Tbec (Pantoprazole Sodium) .Marland Kitchen.. 1 Tablet  Once A Day 30 Min Before Meals 5)  Climara 0.05 Mg/24hr  Ptwk (Estradiol) .... Use Once A Week 6)  Calcium 600 1500 Mg Tabs (Calcium Carbonate) .... Take 1 Tablet By Mouth Once A Day 7)  Viactiv 500-500-40 Mg-Unt-Mcg Chew (Calcium-Vitamin D-Vitamin K) .Marland Kitchen.. 1 By Mouth Two Times A Day 8)  Multivitamins   Tabs (Multiple Vitamin) .... Take 1 Tablet By Mouth Once A Day 9)  Alprazolam 0.5 Mg  Tabs (Alprazolam) .... Take 1/2 To 1 Tab By Mouth Three Times A Day As Needed For Nerves... 10)  Promethazine Hcl 25 Mg Tabs (Promethazine Hcl) .... Take 1/2 To 1 Tab By Mouth Q 4 H As Needed For Nausea... 11)  Advil 200 Mg Tabs (Ibuprofen) .... As Needed 12)  Stool Softner .Marland Kitchen.. 1 By Mouth Two Times A Day 13)  Activia .... Once Daily 14)  Augmentin 875-125 Mg Tabs (Amoxicillin-Pot  Clavulanate) .... Take 1 Tablet By Mouth Two Times A Day  Current Medications (verified): 1)  Nasonex 50 Mcg/act  Susp (Mometasone Furoate) .... Use One Spray in Each Nostril Two Times A Day 2)  Lipitor 20 Mg Tabs (Atorvastatin Calcium) .... Take 1 Tablet By Mouth Once A Day 3)  Protonix 40 Mg  Tbec (Pantoprazole Sodium) .Marland Kitchen.. 1 Tablet  Once A Day 30 Min Before Meals 4)  Promethazine Hcl 25 Mg Tabs (Promethazine Hcl) .... Take 1/2 To 1 Tab By Mouth Q 4 H As Needed For Nausea.Marland KitchenMarland Kitchen 5)  Climara 0.05 Mg/24hr  Ptwk (Estradiol) .... Use Once A Week 6)  Advil 200 Mg Tabs (Ibuprofen) .... As Needed 7)  Calcium 600 1500 Mg Tabs (Calcium Carbonate) .... Take 1 Tablet By Mouth Once A Day 8)  Multivitamins   Tabs (Multiple Vitamin) .... Take 1 Tablet By Mouth Once A Day 9)  Alprazolam 0.5 Mg  Tabs (Alprazolam) .... Take 1/2 To 1 Tab By Mouth Three Times A Day As Needed For Nerves... 10)  Viactiv 500-500-40 Mg-Unt-Mcg Chew (Calcium-Vitamin D-Vitamin K) .Marland Kitchen.. 1 By Mouth Two Times A Day 11)  Stool Softener 100 Mg Caps (Docusate Sodium) .... Take 1 Capsule By Mouth Two Times A Day  Allergies: 1)  ! * Asprin 2)  ! 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  Alcohol Use - no Daily Caffeine Use-1 drink daily Illicit Drug Use - no Patient gets regular exercise.  Risk Factors: Exercise: yes (01/13/2008)  Risk Factors: Smoking Status: never (03/12/2008)  Review of Systems      See HPI  Vital Signs:  Patient profile:   65 year old female Height:      66 inches Weight:      149 pounds BMI:     24.14 O2 Sat:      97 % on Room air Temp:     97.2 degrees F oral Pulse rate:   90 / minute BP sitting:   130 / 72  (left arm) Cuff size:   regular  Vitals Entered By: Boone Master CNA/MA (December 16, 2009 10:08 AM)  O2 Flow:  Room air CC: right ear discomfort and muffled hearing x1week, had sinus infection 1 month and still having sinus pressure with some pinkish nasal drainage. Is Patient Diabetic? No Comments Medications reviewed with patient Daytime contact number verified with patient. Boone Master CNA/MA  December 16, 2009 10:08 AM    Physical Exam  Additional Exam:  WD, WN, [name] in NAD... GENERAL:  Alert & oriented; pleasant & cooperative... HEENT:  [place]/AT, EAC clear,  TMs- on right red, swollen, left clear/wnl, NOSE-clear discharge,  THROAT-clear & wnl. NECK:  Supple w/ full ROM; no JVD; normal carotid impulses w/o bruits; no thyromegaly or nodules palpated; no lymphadenopathy. CHEST:  Clear to P & A; without wheezes/ rales/ or rhonchi. HEART:  Regular Rhythm; without murmurs/ rubs/ or gallops. ABDOMEN:  Soft & non-tender; normal bowel sounds; no organomegaly or masses detected. EXT: without deformities, min arthritic changes; no varicose veins/ venous insuffic/ or edema.     Impression & Recommendations:  Problem # 1:  OTITIS MEDIA, ACUTE, RIGHT (ICD-382.9)  Omnicef 300mg  two times a day for 7 days.  Allegra 180mg  once daily for 3-5 days for sinus congestion Saline nasal rinses as needed  Please contact office for sooner follow up if symptoms do not improve or worsen  follow up  Dr. Kriste Basque as scheduled  The following medications were removed from the medication list:    Augmentin 875-125 Mg Tabs (Amoxicillin-pot clavulanate) .Marland Kitchen... Take 1 tablet by mouth two times a day Her updated medication list for this problem includes:    Advil 200 Mg Tabs (Ibuprofen) .Marland Kitchen... As needed    Cefdinir 300 Mg Caps (Cefdinir) .Marland Kitchen... 1 by mouth two times a day  Orders: Est. Patient Level III ([address])  Medications Added to Medication List This Visit: 1)  Stool Softener 100 Mg Caps (Docusate sodium) .... Take 1 capsule by mouth two times a day 2)  Cefdinir 300 Mg Caps (Cefdinir) .Marland Kitchen.. 1 by mouth two times a day  Complete Medication List: 1)  Nasonex 50 Mcg/act Susp (Mometasone furoate) .... Use  one spray in each nostril two times a day 2)  Lipitor 20 Mg Tabs (Atorvastatin calcium) .... Take 1 tablet by mouth once a day 3)  Protonix 40 Mg Tbec (Pantoprazole sodium) .Marland Kitchen.. 1 tablet  once a day 30 min before meals 4)  Climara 0.05 Mg/24hr Ptwk (Estradiol) .... Use once a week 5)  Calcium 600 1500 Mg Tabs (Calcium carbonate) .... Take 1 tablet by mouth once a day 6)  Viactiv 500-500-40 Mg-unt-mcg Chew (Calcium-vitamin d-vitamin k) .Marland Kitchen.. 1 by mouth two times a day 7)  Multivitamins Tabs (Multiple vitamin) .... Take 1 tablet by mouth once a day 8)  Alprazolam 0.5 Mg Tabs (Alprazolam) .... Take 1/2 to 1 tab by mouth three times a day as needed for nerves.Marland KitchenMarland Kitchen 9)  Stool Softener 100 Mg Caps (Docusate sodium) .... Take 1 capsule by mouth two times a day 10)  Promethazine Hcl 25 Mg Tabs (Promethazine hcl) .... Take 1/2 to 1 tab by mouth q 4 h as needed for nausea... 11)  Advil 200 Mg Tabs (Ibuprofen) .... As needed 12)  Cefdinir 300 Mg Caps (Cefdinir) .Marland Kitchen.. 1 by mouth two times a day  Patient Instructions: 1)  Omnicef 300mg  two times a day for 7 days.  2)  Allegra 180mg  once daily for 3-5 days for sinus congestion 3)  Saline nasal rinses as needed  4)  Please contact office for sooner follow up if  symptoms do not improve or worsen  5)  follow up Dr. Kriste Basque as scheduled  Prescriptions: CEFDINIR 300 MG CAPS (CEFDINIR) 1 by mouth two times a day  #14 x 0   Entered and Authorized by:   Rubye Oaks NP   Signed by:   Tammy Parrett NP on 12/16/2009   Method used:   Electronically to        [facility]  [address] [id number] (retail)       [address]       [place], [place]  [address]       Ph: [phone]       Fax: [phone]   RxID:   [id number]    Immunization History:  Influenza Immunization History:    Influenza:  historical (10/10/2009)

## 2010-03-20 ENCOUNTER — Encounter: Payer: Self-pay | Admitting: Pulmonary Disease

## 2010-03-20 ENCOUNTER — Ambulatory Visit (INDEPENDENT_AMBULATORY_CARE_PROVIDER_SITE_OTHER): Payer: BC Managed Care – PPO | Admitting: Pulmonary Disease

## 2010-03-20 ENCOUNTER — Other Ambulatory Visit: Payer: Self-pay | Admitting: Pulmonary Disease

## 2010-03-20 ENCOUNTER — Other Ambulatory Visit: Payer: BC Managed Care – PPO

## 2010-03-20 ENCOUNTER — Ambulatory Visit (INDEPENDENT_AMBULATORY_CARE_PROVIDER_SITE_OTHER)
Admission: RE | Admit: 2010-03-20 | Discharge: 2010-03-20 | Disposition: A | Discharge status: Home / Self Care | Payer: BC Managed Care – PPO | Source: Ambulatory Visit | Attending: Pulmonary Disease | Admitting: Pulmonary Disease

## 2010-03-20 DIAGNOSIS — Z0000 Encounter for general adult medical examination without abnormal findings: Secondary | ICD-10-CM

## 2010-03-20 DIAGNOSIS — Z23 Encounter for immunization: Secondary | ICD-10-CM

## 2010-03-20 LAB — LIPID PANEL
HDL: 56.9 mg/dL (ref >39.00)
LDL Cholesterol: 90 mg/dL (ref 0–99)
Triglycerides: 159 mg/dL — ABNORMAL HIGH (ref 0.0–149.0)
VLDL: 31.8 mg/dL (ref 0.0–40.0)

## 2010-03-20 LAB — CBC WITH DIFFERENTIAL/PLATELET
Basophils Absolute: 0 10*3/uL (ref 0.0–0.1)
Eosinophils Absolute: 0.1 10*3/uL (ref 0.0–0.7)
Eosinophils Relative: 3.1 % (ref 0.0–5.0)
Lymphocytes Relative: 35.2 % (ref 12.0–46.0)
Lymphs Abs: 1.5 10*3/uL (ref 0.7–4.0)
Monocytes Absolute: 0.3 10*3/uL (ref 0.1–1.0)
Monocytes Relative: 7.9 % (ref 3.0–12.0)
Neutro Abs: 2.3 10*3/uL (ref 1.4–7.7)
Neutrophils Relative %: 53.4 % (ref 43.0–77.0)
Platelets: 180 10*3/uL (ref 150.0–400.0)
RDW: 12.9 % (ref 11.5–14.6)
WBC: 4.3 10*3/uL — ABNORMAL LOW (ref 4.5–10.5)

## 2010-03-20 LAB — URINALYSIS, ROUTINE W REFLEX MICROSCOPIC
Bilirubin Urine: NEGATIVE
Leukocytes, UA: NEGATIVE
Specific Gravity, Urine: <=1.005 (ref 1.000–1.030)
Urine Glucose: NEGATIVE
Urobilinogen, UA: 0.2 (ref 0.0–1.0)
pH: 6.5 (ref 5.0–8.0)

## 2010-03-20 LAB — HEPATIC FUNCTION PANEL
AST: 28 U/L (ref 0–37)
Bilirubin, Direct: 0 mg/dL (ref 0.0–0.3)
Total Bilirubin: 0.5 mg/dL (ref 0.3–1.2)

## 2010-03-20 LAB — BASIC METABOLIC PANEL: Chloride: 98 mEq/L (ref 96–112)

## 2010-04-02 NOTE — Assessment & Plan Note (Signed)
Summary: cpx/ fasting//apc   Primary Care Provider:  Alroy Dust MD   CC:  Yearly ROV & CPX....  History of Present Illness: [name] here for a follow up visit... she has multiple medical problems as noted below...     ~  [name]:  she returns w/ gastroparesis symptoms much improved w/ dietary adjustment & lost 6# in the process... feels well, no new complaints or concerns... she's had both Flu shots this yr and requests refills for Protonix and Lipitor...  ~  EAV40:  she's had a good year- GI symptoms remain controlled w/ Protonix & diet regimen...  stable on Lipitor20 & low fat diet...  c/o post nasal drip- using saline + nasonex, rec adding Mucinex + Astelin... she had the 2010 Flu vaccine in Sep10.   ~  March 20, 2010:  Tabitha Carlson has had another good yr- feeling well w/o new complaints or concerns...  she notes some incr in anxiety & has Alpraz for Prn use;  she had colonoscopy 7/11 from DrStark w/ hyperpl polyp, divertics, hem;  Lipids stable on diet + Lip20;  OK TDAP today...    Current Problem List:  ALLERGIC RHINITIS (ICD-477.9) - on NASONEX Prn + OTC meds as needed... we discussed using an antihist (eg- Zyrtek) Qam, saline during the day, and Nasonex Qhs... trial of Astepro for drip.  FAMILY HISTORY OF ISCHEMIC HEART DISEASE (ICD-V17.3) - takes ASA 1/d... NuclearStressTest negative in 2/05- no ischemia & EF=71%... she is active w/ Yoga, treadmill, grandkids etc- denies HA, fatigue, visual changes, CP, palipit, dizziness, syncope, dyspnea, edema, etc...   HYPERLIPIDEMIA (ICD-272.4) - on LIPITOR 20mg /d and tol well...   ~  FLP 02/22/07 showed TChol 168, TG 151, HDL 49, LDL 89  ~  FLP 2/10 showed TChol 173, TG 122, HDL 52, LDL 97  ~  FLP 2/11 showed TChol 158, TG 81, HDL 59, LDL 83  ~  FLP 2/12 showed TChol 179, TG 159, HDL 57, LDL 90  GERD (ICD-530.81) & GASTROPARESIS - on PROTONIX 40mg /d & Phenergan Prn (she gets occas nausea)...  ~  10/09 eval by DrStark w/ EGD= normal, and  Gastric Emptying Scan +for gastroparesis... Rx w/ dietary adjustment and improved...  ~  11/10: she had f/u DrStark- stable, continue Protonix Rx...  IBS (ICD-564.1) & COLONIC POLYPS (ICD-211.3)  ~  colonoscopy 3/06 by DrStark showed several 2-37mm polyps (path=tubular adenoms).  ~  f/u colonosco[py 7/11 showed rectal polyp= hyperplastic, divertics, hem... f/u 77yrs.  DEGENERATIVE JOINT DISEASE (ICD-715.90) - uses ADVIL OTC w/ some relief... her right shoulder pain improved after she changed mattresses...  ANXIETY (ICD-300.00) - on ALPRAZOLAM 0.5mg  as needed.  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  HEALTH MAINTENANCE:  GYN= DrRomaine and up-to-date on the needed tests... PAP s/p hyst (done Oct09), [facility]), & BMD ([facility] & last in ?2007- reported WNL)... she takes calcium gummies (intol to caltrate etc), MVI, & Vit D 1000 u daily... she gets the seasonal Flu vaccine yearly;  had Pneumovax 2002;  given TDAP 2/12...   Preventive Screening-Counseling & Management  Alcohol-Tobacco     Smoking Status: never  Caffeine-Diet-Exercise     Does Patient Exercise: yes  Allergies: 1)  ! * Asprin 2)  ! * Shellfish  Comments:  Nurse/Medical Assistant: The patient's medications and allergies were reviewed with the patient and were updated in the Medication and Allergy Lists.  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 mother  died age 22  w/  MI father died age 36 in a MVA 3Sibs- 1Bro died age 63 drowned  2Sis are well Family History of Breast Cancer: Maternal Aunt x 2 Family History of Esophageal Cancer: Maternal Uncle  Social History: Reviewed history from 01/02/2008 and no changes required. 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  Alcohol Use - no Daily Caffeine Use-1 drink daily Illicit Drug Use - no Patient gets regular exercise.  Review of Systems       The patient complains of dyspnea on exertion, gas/bloating, and anxiety.  The patient denies fever, chills, sweats, anorexia, fatigue, weakness, malaise, weight loss, sleep disorder, blurring, diplopia, eye irritation, eye discharge, vision loss, eye pain, photophobia, earache, ear discharge, tinnitus, decreased hearing, nasal congestion, nosebleeds, sore throat, hoarseness, chest pain, palpitations, syncope, orthopnea, PND, peripheral edema, cough, dyspnea at rest, excessive sputum, hemoptysis, wheezing, pleurisy, nausea, vomiting, diarrhea, constipation, change in bowel habits, abdominal pain, melena, hematochezia, jaundice, indigestion/heartburn, dysphagia, odynophagia, dysuria, hematuria, urinary frequency, urinary hesitancy, nocturia, incontinence, back pain, joint pain, joint swelling, muscle cramps, muscle weakness, stiffness, arthritis, sciatica, restless legs, leg pain at night, leg pain with exertion, rash, itching, dryness, suspicious lesions, paralysis, paresthesias, seizures, tremors, vertigo, transient blindness, frequent falls, frequent headaches, difficulty walking, depression, memory loss, confusion, cold intolerance, heat intolerance, polydipsia, polyphagia, polyuria, unusual weight change, abnormal bruising, bleeding, enlarged lymph nodes, urticaria, allergic rash, hay fever, and recurrent infections.    Vital Signs:  Patient profile:   65 year old female Height:      66 inches Weight:      154 pounds BMI:     24.95 O2 Sat:      99 % on  Room air Temp:     97.3 degrees F oral Pulse rate:   66 / minute BP sitting:   132 / 78  (right arm) Cuff size:   regular  Vitals Entered By: Randell Loop CMA (March 20, 2010 9:25 AM)  O2 Sat at Rest %:  99 O2 Flow:  Room air CC: Yearly ROV & CPX... Is Patient Diabetic? No Pain Assessment Patient in pain? no      Comments meds updated today with pt   Physical Exam  Additional Exam:  WD, WN, [name] in NAD... GENERAL:  Alert & oriented; pleasant & cooperative... HEENT:  [place]/AT, EOM-wnl, PERRLA, sl left ptosis, EACs-clear, TMs-wnl, NOSE-clear, THROAT-clear & wnl. NECK:  Supple w/ full ROM; no JVD; normal carotid impulses w/o bruits; no thyromegaly or nodules palpated; no lymphadenopathy. CHEST:  Clear to P & A; without wheezes/ rales/ or rhonchi. HEART:  Regular Rhythm; without murmurs/ rubs/ or gallops. ABDOMEN:  Soft & non-tender; normal bowel sounds; no organomegaly or masses detected. EXT: without deformities, min arthritic changes; no varicose veins/ venous insuffic/ or edema. NEURO:  CN's intact; no focal neuro deficits... DERM:  No lesions noted; no rash, no petechiae etc...    Impression & Recommendations:  Problem # 1:  PHYSICAL EXAMINATION (ICD-V70.0)  Orders: EKG w/ Interpretation (93000) T-2 View CXR (71020TC) TLB-BMP (Basic Metabolic Panel-BMET) (80048-METABOL) TLB-Hepatic/Liver Function Pnl (80076-HEPATIC) TLB-CBC  Platelet - w/Differential (85025-CBCD) TLB-Lipid Panel (80061-LIPID) TLB-TSH (Thyroid Stimulating Hormone) (84443-TSH) TLB-Udip w/ Micro (81001-URINE)  Problem # 2:  HYPERLIPIDEMIA (ICD-272.4) Stable on the Lip20 but sl incr TG - needs better low fat diet... Her updated medication list for this problem includes:    Lipitor 20 Mg Tabs (Atorvastatin calcium) .Marland Kitchen... Take 1 tablet by mouth once a day  Problem # 3:  GERD (ICD-530.81) Stable>  continue the PPI & phenergan as needed for the gastroparesisi. Her updated medication list for this  problem includes:    Protonix 40 Mg Tbec (Pantoprazole sodium) .Marland Kitchen... 1 tablet  once a day 30 min before meals  Problem # 4:  IBS (ICD-564.1) S/p colonoscopy 7/11 w/ hyperpl polyp removed... continue fiber etc...  Problem # 5:  DEGENERATIVE JOINT DISEASE (ICD-715.90) Stable w/ OTC meds used... Her updated medication list for this problem includes:    Advil 200 Mg Tabs (Ibuprofen) .Marland Kitchen... As needed  Problem # 6:  ANXIETY (ICD-300.00) Alpraz as needed helps... Her updated medication list for this problem includes:    Alprazolam 0.5 Mg Tabs (Alprazolam) .Marland Kitchen... Take 1/2 to 1 tab by mouth three times a day as needed for nerves...  Complete Medication List: 1)  Nasonex 50 Mcg/act Susp (Mometasone furoate) .... Use one spray in each nostril two times a day 2)  Lipitor 20 Mg Tabs (Atorvastatin calcium) .... Take 1 tablet by mouth once a day 3)  Protonix 40 Mg Tbec (Pantoprazole sodium) .Marland Kitchen.. 1 tablet  once a day 30 min before meals 4)  Stool Softener 100 Mg Caps (Docusate sodium) .... Take 1 capsule by mouth two times a day 5)  Promethazine Hcl 25 Mg Tabs (Promethazine hcl) .... Take 1/2 to 1 tab by mouth q 4 h as needed for nausea.Marland KitchenMarland Kitchen 6)  Climara 0.05 Mg/24hr Ptwk (Estradiol) .... Use once a week 7)  Advil 200 Mg Tabs (Ibuprofen) .... As needed 8)  Calcium 600 1500 Mg Tabs (Calcium carbonate) .... Take 1 tablet by mouth once a day 9)  Multivitamins Tabs (Multiple vitamin) .... Take 1 tablet by mouth once a day 10)  Vitamin D 1000 Unit Tabs (Cholecalciferol) .... Take 1 tab by mouth once daily... 11)  Alprazolam 0.5 Mg Tabs (Alprazolam) .... Take 1/2 to 1 tab by mouth three times a day as needed for nerves...  Other Orders: Tdap => 44yrs IM (04540) Admin 1st Vaccine (98119)  Patient Instructions: 1)  Today we updated your med list- see below.... 2)  We refilled your meds for 2012... 3)  Today we did your follow up CXR, EKG, & FASTING blood work... please call the "phone tree" in a few days for  your lab results.Marland KitchenMarland Kitchen 4)  Keep up the good work w/ diet + exercise... 5)  We gave you the combination Tetanus vaccine calle the TDAP today (it is good for 26yrs)... 6)  Call for any questions or concerns... 7)  Please schedule a follow-up appointment in 1 year, sooner as needed... Prescriptions: PROMETHAZINE HCL 25 MG TABS (PROMETHAZINE HCL) take 1/2 to 1 tab by mouth Q 4 H as needed for nausea...  #50 x 6   Entered and Authorized by:   Michele Mcalpine MD   Signed by:   Michele Mcalpine MD on 03/20/2010   Method used:   Print then Give to Patient   RxID:   [id number] ALPRAZOLAM 0.5 MG  TABS (ALPRAZOLAM) Take 1/2 to 1 tab by mouth three times a day as needed for nerves...  #  90 x 6   Entered and Authorized by:   Michele Mcalpine MD   Signed by:   Michele Mcalpine MD on 03/20/2010   Method used:   Print then Give to Patient   RxID:   [id number] PROTONIX 40 MG  TBEC (PANTOPRAZOLE SODIUM) 1 tablet  once a day 30 min before meals  #90 x 4   Entered and Authorized by:   Michele Mcalpine MD   Signed by:   Michele Mcalpine MD on 03/20/2010   Method used:   Print then Give to Patient   RxID:   [id number] LIPITOR 20 MG TABS (ATORVASTATIN CALCIUM) Take 1 tablet by mouth once a day  #90 x 4   Entered and Authorized by:   Michele Mcalpine MD   Signed by:   Michele Mcalpine MD on 03/20/2010   Method used:   Print then Give to Patient   RxID:   [id number] NASONEX 50 MCG/ACT  SUSP (MOMETASONE FUROATE) use one spray in each nostril two times a day  #1 x 12   Entered and Authorized by:   Michele Mcalpine MD   Signed by:   Michele Mcalpine MD on 03/20/2010   Method used:   Print then Give to Patient   RxID:   [id number]    Immunizations Administered:  Tetanus Vaccine:    Vaccine Type: Tdap    Site: left deltoid    Mfr: [employer]oSmith[employer]    Dose: 0.5 ml    Route: IM    Given by: Randell Loop CMA    Exp. Date: 11/29/2011    Lot #: [id number]    VIS given: 12/28/07 version given March 20, 2010.

## 2010-10-29 ENCOUNTER — Other Ambulatory Visit: Payer: Self-pay | Admitting: *Deleted

## 2010-10-29 MED ORDER — ALPRAZOLAM 0.5 MG PO TABS: 0.5000 mg | ORAL_TABLET | Freq: Three times a day (TID) | ORAL | Status: AC | PRN

## 2010-11-20 ENCOUNTER — Other Ambulatory Visit: Payer: Self-pay | Admitting: Obstetrics and Gynecology

## 2010-11-20 DIAGNOSIS — Z1231 Encounter for screening mammogram for malignant neoplasm of breast: Secondary | ICD-10-CM

## 2010-12-26 ENCOUNTER — Other Ambulatory Visit: Payer: Self-pay | Admitting: Obstetrics and Gynecology

## 2010-12-26 DIAGNOSIS — M949 Disorder of cartilage, unspecified: Secondary | ICD-10-CM

## 2010-12-26 DIAGNOSIS — N951 Menopausal and female climacteric states: Secondary | ICD-10-CM

## 2010-12-26 DIAGNOSIS — M899 Disorder of bone, unspecified: Secondary | ICD-10-CM

## 2010-12-30 ENCOUNTER — Ambulatory Visit
Admission: RE | Admit: 2010-12-30 | Discharge: 2010-12-30 | Disposition: A | Discharge status: Home / Self Care | Payer: BC Managed Care – PPO | Source: Ambulatory Visit | Attending: Obstetrics and Gynecology | Admitting: Obstetrics and Gynecology

## 2010-12-30 DIAGNOSIS — N951 Menopausal and female climacteric states: Secondary | ICD-10-CM

## 2010-12-30 DIAGNOSIS — M899 Disorder of bone, unspecified: Secondary | ICD-10-CM

## 2010-12-30 DIAGNOSIS — Z1231 Encounter for screening mammogram for malignant neoplasm of breast: Secondary | ICD-10-CM

## 2011-02-24 ENCOUNTER — Other Ambulatory Visit: Payer: Self-pay | Admitting: Dermatology

## 2011-03-26 ENCOUNTER — Ambulatory Visit (INDEPENDENT_AMBULATORY_CARE_PROVIDER_SITE_OTHER): Payer: BC Managed Care – PPO | Admitting: Pulmonary Disease

## 2011-03-26 ENCOUNTER — Ambulatory Visit (INDEPENDENT_AMBULATORY_CARE_PROVIDER_SITE_OTHER)
Admission: RE | Admit: 2011-03-26 | Discharge: 2011-03-26 | Disposition: A | Discharge status: Home / Self Care | Payer: BC Managed Care – PPO | Source: Ambulatory Visit | Attending: Pulmonary Disease | Admitting: Pulmonary Disease

## 2011-03-26 ENCOUNTER — Other Ambulatory Visit (INDEPENDENT_AMBULATORY_CARE_PROVIDER_SITE_OTHER): Payer: BC Managed Care – PPO

## 2011-03-26 ENCOUNTER — Encounter: Payer: Self-pay | Admitting: Pulmonary Disease

## 2011-03-26 VITALS — BP 122/60 | HR 70 | Temp 96.7°F | Ht 66.0 in | Wt 156.2 lb

## 2011-03-26 DIAGNOSIS — Z0000 Encounter for general adult medical examination without abnormal findings: Secondary | ICD-10-CM

## 2011-03-26 DIAGNOSIS — K589 Irritable bowel syndrome without diarrhea: Secondary | ICD-10-CM

## 2011-03-26 DIAGNOSIS — D126 Benign neoplasm of colon, unspecified: Secondary | ICD-10-CM

## 2011-03-26 DIAGNOSIS — J309 Allergic rhinitis, unspecified: Secondary | ICD-10-CM

## 2011-03-26 DIAGNOSIS — E785 Hyperlipidemia, unspecified: Secondary | ICD-10-CM

## 2011-03-26 DIAGNOSIS — K219 Gastro-esophageal reflux disease without esophagitis: Secondary | ICD-10-CM

## 2011-03-26 DIAGNOSIS — F411 Generalized anxiety disorder: Secondary | ICD-10-CM

## 2011-03-26 DIAGNOSIS — M1990 Unspecified osteoarthritis, unspecified site: Secondary | ICD-10-CM

## 2011-03-26 DIAGNOSIS — K3184 Gastroparesis: Secondary | ICD-10-CM

## 2011-03-26 LAB — CBC WITH DIFFERENTIAL/PLATELET
Basophils Absolute: 0 10*3/uL (ref 0.0–0.1)
Basophils Relative: 0.6 % (ref 0.0–3.0)
Eosinophils Absolute: 0.1 10*3/uL (ref 0.0–0.7)
HCT: 41.1 % (ref 36.0–46.0)
Hemoglobin: 14.1 g/dL (ref 12.0–15.0)
Lymphocytes Relative: 35.9 % (ref 12.0–46.0)
Lymphs Abs: 1.6 10*3/uL (ref 0.7–4.0)
MCHC: 34.4 g/dL (ref 30.0–36.0)
MCV: 90.8 fl (ref 78.0–100.0)
Monocytes Absolute: 0.3 10*3/uL (ref 0.1–1.0)
Neutro Abs: 2.3 10*3/uL (ref 1.4–7.7)
RBC: 4.52 Mil/uL (ref 3.87–5.11)
RDW: 12.9 % (ref 11.5–14.6)

## 2011-03-26 LAB — LIPID PANEL
HDL: 64.7 mg/dL (ref >39.00)
LDL Cholesterol: 91 mg/dL (ref 0–99)
Total CHOL/HDL Ratio: 3
Triglycerides: 82 mg/dL (ref 0.0–149.0)

## 2011-03-26 LAB — BASIC METABOLIC PANEL
Calcium: 9.9 mg/dL (ref 8.4–10.5)
Creatinine, Ser: 0.9 mg/dL (ref 0.4–1.2)
GFR: 67.83 mL/min (ref >60.00)
Sodium: 141 mEq/L (ref 135–145)

## 2011-03-26 LAB — HEPATIC FUNCTION PANEL
Alkaline Phosphatase: 49 U/L (ref 39–117)
Bilirubin, Direct: 0.1 mg/dL (ref 0.0–0.3)
Total Bilirubin: 0.5 mg/dL (ref 0.3–1.2)

## 2011-03-26 LAB — URINALYSIS
Bilirubin Urine: NEGATIVE
Nitrite: NEGATIVE
Total Protein, Urine: NEGATIVE
Urine Glucose: NEGATIVE
pH: 6 (ref 5.0–8.0)

## 2011-03-26 MED ORDER — ATORVASTATIN CALCIUM 20 MG PO TABS: 20.0000 mg | ORAL_TABLET | Freq: Every day | ORAL | Status: DC

## 2011-03-26 MED ORDER — PANTOPRAZOLE SODIUM 40 MG PO TBEC: 40.0000 mg | DELAYED_RELEASE_TABLET | Freq: Every day | ORAL | Status: DC

## 2011-03-26 MED ORDER — ALPRAZOLAM 0.5 MG PO TABS: 0.5000 mg | ORAL_TABLET | Freq: Three times a day (TID) | ORAL | Status: DC | PRN

## 2011-03-26 NOTE — Progress Notes (Addendum)
Subjective:     Patient ID: Tabitha Carlson, female   DOB: [birth year], 66 y.o.   MRN: [id number]  HPI 66 y/o WF here for a follow up visit... she has multiple medical problems as noted below...    ~  [name]:  she returns w/ gastroparesis symptoms much improved w/ dietary adjustment & lost 6# in the process... feels well, no new complaints or concerns... she's had both Flu shots this yr and requests refills for Protonix and Lipitor... ~  [name]:  she's had a good year- GI symptoms remain controlled w/ Protonix & diet regimen...  stable on Lipitor20 & low fat diet...  c/o post nasal drip- using saline + nasonex, rec adding Mucinex + Astelin... she had the 2010 Flu vaccine in Sep10.  ~  March 20, 2010:  [name] has had another good yr- feeling well w/o new complaints or concerns...  she notes some incr in anxiety & has Alpraz for Prn use;  she had colonoscopy 7/11 from DrStark w/ hyperpl polyp, divertics, hem;  Lipids stable on diet + Lip20;  OK TDAP today...  ~  March 26, 2011:  Yearly ROV & CPX> Marcedes continues to do well w/o new comlaints or oncerns;  She remains active 7 denies CP, palpit, dizzy, SOB, edema, etc;  Her Chol looks great on Lip20;  GERD & gastroparesis ok w/ Protonix daily;  Up to date on colon screening from DrStark;  She uses Advil for arthritis & Alprazolam for anxiety... SEE PROB LIST BELOW>> CXR 2/13 showed normal hear size & clear lungs, NAD... LABS 2/13 showed FLP- at goals on Lip20;  Chems- wnl;  CBC- wnl;  TSH=1.80;  VitD= 59 on 2000u supplement;  UA- clear.   PROBLEM LIST:    ALLERGIC RHINITIS (ICD-477.9) - on NASONEX Prn + OTC meds as needed... we discussed using an antihist (eg- Zyrtek) Qam, saline during the day, and Nasonex Qhs... trial of Astepro for drip.  FAMILY HISTORY OF ISCHEMIC HEART DISEASE (ICD-V17.3) - takes ASA 1/d... NuclearStressTest negative in 2/05- no ischemia & EF=71%... she is active w/ Yoga, treadmill, grandkids etc- denies HA, fatigue, visual  changes, CP, palipit, dizziness, syncope, dyspnea, edema, etc...   HYPERLIPIDEMIA (ICD-272.4) - on LIPITOR 20mg /d and tol well...  ~  FLP 02/22/07 showed TChol 168, TG 151, HDL 49, LDL 89 ~  FLP 2/10 showed TChol 173, TG 122, HDL 52, LDL 97 ~  FLP 2/11 showed TChol 158, TG 81, HDL 59, LDL 83 ~  FLP 2/12 showed TChol 179, TG 159, HDL 57, LDL 90 ~  FLP 2/13 on Lip20 showed TChol 172, TG 82, HDL 65, LDL 91  GERD (ICD-530.81) & GASTROPARESIS - on PROTONIX 40mg /d & Phenergan Prn (she gets occas nausea)... ~  10/09 eval by DrStark w/ EGD= normal, and Gastric Emptying Scan +for gastroparesis... Rx w/ dietary adjustment and improved... ~  11/10: she had f/u DrStark- stable, continue Protonix Rx...  IBS (ICD-564.1) & COLONIC POLYPS (ICD-211.3) ~  colonoscopy 3/06 by DrStark showed several 2-8mm polyps (path=tubular adenoms). ~  f/u colonosco[py 7/11 showed rectal polyp= hyperplastic, divertics, hem... f/u 9yrs.  DEGENERATIVE JOINT DISEASE (ICD-715.90) - uses ADVIL OTC w/ some relief... her right shoulder pain improved after she changed mattresses...  ANXIETY (ICD-300.00) - on ALPRAZOLAM 0.5mg  as needed.  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 ~  Labs 2/13 showed Hg=14.1, MCV= 91  HEALTH MAINTENANCE:  GYN= DrRomaine and up-to-date on the needed tests... PAP s/p  hyst (done Oct09), [facility] & [facility] ([facility] & last in ?2007- reported WNL)... she takes calcium gummies (intol to caltrate etc), MVI, & Vit D 1000 u daily... she gets the seasonal Flu vaccine yearly;  had Pneumovax 2002;  given TDAP 2/12...   Past Surgical History  Procedure Date  . Abdominal hysterectomy   . Left breast biopsy     benign x 2    Outpatient Encounter Prescriptions as of 03/26/2011  Medication Sig Dispense Refill  . ALPRAZolam (XANAX) 0.5 MG tablet Take 0.5 mg by mouth 3 (three) times daily as needed.      Marland Kitchen  atorvastatin (LIPITOR) 20 MG tablet Take 20 mg by mouth daily.      . calcium gluconate 500 MG tablet Take 500 mg by mouth daily.      Jennette Banker Sodium 30-100 MG CAPS Take 2 capsules by mouth daily.      . Cholecalciferol (VITAMIN D) 2000 UNITS CAPS Take 1 capsule by mouth daily.      Marland Kitchen estradiol (VIVELLE-DOT) 0.05 MG/24HR Place 1 patch onto the skin once a week.      . Fish Oil-Krill Oil CAPS Take 1 capsule by mouth daily.      . mometasone (NASONEX) 50 MCG/ACT nasal spray Place 2 sprays into the nose daily.      . Multiple Vitamins-Minerals (MULTIPLE VITAMINS/WOMENS PO) Take 1 tablet by mouth daily.      . pantoprazole (PROTONIX) 40 MG tablet Take 40 mg by mouth daily.      . promethazine (PHENERGAN) 25 MG tablet Take 25 mg by mouth every 6 (six) hours as needed.        Allergies  Allergen Reactions  . Aspirin     Unable to take a full aspirin--causes palpitations  . Crab (Shellfish Allergy)     Rash from head to toe    Current Medications, Allergies, Past Medical History, Past Surgical History, Family History, and Social History were reviewed in [employer] record.    Review of Systems        The patient complains of dyspnea on exertion, gas/bloating, and anxiety.  The patient denies fever, chills, sweats, anorexia, fatigue, weakness, malaise, weight loss, sleep disorder, blurring, diplopia, eye irritation, eye discharge, vision loss, eye pain, photophobia, earache, ear discharge, tinnitus, decreased hearing, nasal congestion, nosebleeds, sore throat, hoarseness, chest pain, palpitations, syncope, orthopnea, PND, peripheral edema, cough, dyspnea at rest, excessive sputum, hemoptysis, wheezing, pleurisy, nausea, vomiting, diarrhea, constipation, change in bowel habits, abdominal pain, melena, hematochezia, jaundice, indigestion/heartburn, dysphagia, odynophagia, dysuria, hematuria, urinary frequency, urinary hesitancy, nocturia, incontinence, back pain,  joint pain, joint swelling, muscle cramps, muscle weakness, stiffness, arthritis, sciatica, restless legs, leg pain at night, leg pain with exertion, rash, itching, dryness, suspicious lesions, paralysis, paresthesias, seizures, tremors, vertigo, transient blindness, frequent falls, frequent headaches, difficulty walking, depression, memory loss, confusion, cold intolerance, heat intolerance, polydipsia, polyphagia, polyuria, unusual weight change, abnormal bruising, bleeding, enlarged lymph nodes, urticaria, allergic rash, hay fever, and recurrent infections.     Objective:   Physical Exam     WD, WN, 66 y/o WF in NAD... GENERAL:  Alert & oriented; pleasant & cooperative... HEENT:  [place]/AT, EOM-wnl, PERRLA, sl left ptosis, EACs-clear, TMs-wnl, NOSE-clear, THROAT-clear & wnl. NECK:  Supple w/ full ROM; no JVD; normal carotid impulses w/o bruits; no thyromegaly or nodules palpated; no lymphadenopathy. CHEST:  Clear to P & A; without wheezes/ rales/ or  rhonchi. HEART:  Regular Rhythm; without murmurs/ rubs/ or gallops. ABDOMEN:  Soft & non-tender; normal bowel sounds; no organomegaly or masses detected. EXT: without deformities, min arthritic changes; no varicose veins/ venous insuffic/ or edema. NEURO:  CN's intact; no focal neuro deficits... DERM:  No lesions noted; no rash, no petechiae etc...  RADIOLOGY DATA:  Reviewed in the EPIC EMR & discussed w/ the patient...    >>CXR 2/13 showed normal hear size & clear lungs, NAD...  LABORATORY DATA:  Reviewed in the EPIC EMR & discussed w/ the patient...    >>LABS 2/13 showed FLP- at goals on Lip20;  Chems- wnl;  CBC- wnl;  TSH=1.80;  VitD= 59 on 2000u supplement;  UA- clear.   Assessment:     AR>  Stable on OTC antihist + NASONEX as needed...  Hyperlipid>  Stable on Lip20 w/ FLP parameters at goals...  GERD>  On Protonix, diet & doing satis...  IBS, Polyps>  She denies lower GI symptoms & is up to date on colon screening...  DJD>  She uses  Advil OTC for relief...  Anxiety>  On Alpraz as needed...     Plan:     Patient's Medications  New Prescriptions   No medications on file  Previous Medications   CALCIUM GLUCONATE 500 MG TABLET    Take 500 mg by mouth daily.   CASANTHRANOL-DOCUSATE SODIUM 30-100 MG CAPS    Take 2 capsules by mouth daily.   CHOLECALCIFEROL (VITAMIN D) 2000 UNITS CAPS    Take 1 capsule by mouth daily.   ESTRADIOL (VIVELLE-DOT) 0.05 MG/24HR    Place 1 patch onto the skin once a week.   FISH OIL-KRILL OIL CAPS    Take 1 capsule by mouth daily.   MOMETASONE (NASONEX) 50 MCG/ACT NASAL SPRAY    Place 2 sprays into the nose daily.   MULTIPLE VITAMINS-MINERALS (MULTIPLE VITAMINS/WOMENS PO)    Take 1 tablet by mouth daily.   PROMETHAZINE (PHENERGAN) 25 MG TABLET    Take 25 mg by mouth every 6 (six) hours as needed.  Modified Medications   Modified Medication Previous Medication   ALPRAZOLAM (XANAX) 0.5 MG TABLET ALPRAZolam (XANAX) 0.5 MG tablet      Take 1 tablet (0.5 mg total) by mouth 3 (three) times daily as needed.    Take 0.5 mg by mouth 3 (three) times daily as needed.   ATORVASTATIN (LIPITOR) 20 MG TABLET atorvastatin (LIPITOR) 20 MG tablet      Take 1 tablet (20 mg total) by mouth daily.    Take 20 mg by mouth daily.   PANTOPRAZOLE (PROTONIX) 40 MG TABLET pantoprazole (PROTONIX) 40 MG tablet      Take 1 tablet (40 mg total) by mouth daily.    Take 40 mg by mouth daily.  Discontinued Medications   No medications on file

## 2011-03-26 NOTE — Patient Instructions (Signed)
Today we updated your med list in our EPIC system...    Continue your current medications the same...    We refilled your prescriptions as requested...  Today we did your follow up CXR, EKG, & fasting blood work...    Please call the [facility] in a few days for your results...    Dial [id number] & when prompted enter your patient number followed by the # symbol...    Your patient number is:  [id number]#  Keep up the great work w/ your diet & exercise program...  Call for any problems...  Let's plan a similar check up in 1 years time.Marland KitchenMarland Kitchen

## 2011-04-10 HISTORY — PX: CYST EXCISION: SHX5701

## 2011-08-07 ENCOUNTER — Other Ambulatory Visit: Payer: Self-pay | Admitting: Pulmonary Disease

## 2011-10-20 ENCOUNTER — Other Ambulatory Visit: Payer: Self-pay | Admitting: Pulmonary Disease

## 2011-10-20 MED ORDER — ALPRAZOLAM 0.5 MG PO TABS: 0.5000 mg | ORAL_TABLET | Freq: Three times a day (TID) | ORAL | Status: DC | PRN

## 2011-11-17 ENCOUNTER — Other Ambulatory Visit: Payer: Self-pay | Admitting: Obstetrics and Gynecology

## 2011-11-17 DIAGNOSIS — Z1231 Encounter for screening mammogram for malignant neoplasm of breast: Secondary | ICD-10-CM

## 2011-12-31 ENCOUNTER — Ambulatory Visit
Admission: RE | Admit: 2011-12-31 | Discharge: 2011-12-31 | Disposition: A | Discharge status: Home / Self Care | Payer: BC Managed Care – PPO | Source: Ambulatory Visit | Attending: Obstetrics and Gynecology | Admitting: Obstetrics and Gynecology

## 2011-12-31 DIAGNOSIS — Z1231 Encounter for screening mammogram for malignant neoplasm of breast: Secondary | ICD-10-CM

## 2012-02-18 ENCOUNTER — Encounter: Payer: Self-pay | Admitting: Adult Health

## 2012-02-18 ENCOUNTER — Ambulatory Visit (INDEPENDENT_AMBULATORY_CARE_PROVIDER_SITE_OTHER): Payer: Medicare Other | Admitting: Adult Health

## 2012-02-18 VITALS — BP 142/80 | HR 86 | Temp 97.5°F | Ht 66.0 in | Wt 155.4 lb

## 2012-02-18 DIAGNOSIS — J069 Acute upper respiratory infection, unspecified: Secondary | ICD-10-CM | POA: Insufficient documentation

## 2012-02-18 MED ORDER — AZITHROMYCIN 250 MG PO TABS: ORAL_TABLET | ORAL | Status: AC

## 2012-02-18 NOTE — Patient Instructions (Addendum)
Z-Pak take as directed. Mucinex as needed. For congestion. Saline nasal spray as needed. Fluids and rest. Tylenol as needed. Followup in one month for physical as planned Please contact office for sooner follow up if symptoms do not improve or worsen or seek emergency care

## 2012-02-18 NOTE — Progress Notes (Signed)
Subjective:     Patient ID: Tabitha Carlson, female   DOB: [birth year], 67 y.o.   MRN: [id number]  HPI  67 y/o WF    ~  [name]:  she returns w/ gastroparesis symptoms much improved w/ dietary adjustment & lost 6# in the process... feels well, no new complaints or concerns... she's had both Flu shots this yr and requests refills for Protonix and Lipitor... ~  [id number]:  she's had a good year- GI symptoms remain controlled w/ Protonix & diet regimen...  stable on Lipitor20 & low fat diet...  c/o post nasal drip- using saline + nasonex, rec adding Mucinex + Astelin... she had the 2010 Flu vaccine in Sep10.  ~  March 20, 2010:  Tabitha Carlson has had another good yr- feeling well w/o new complaints or concerns...  she notes some incr in anxiety & has Alpraz for Prn use;  she had colonoscopy 7/11 from DrStark w/ hyperpl polyp, divertics, hem;  Lipids stable on diet + Lip20;  OK TDAP today...  ~  March 26, 2011:  Yearly ROV & CPX> Tabitha Carlson continues to do well w/o new comlaints or oncerns;  She remains active 7 denies CP, palpit, dizzy, SOB, edema, etc;  Her Chol looks great on Lip20;  GERD & gastroparesis ok w/ Protonix daily;  Up to date on colon screening from DrStark;  She uses Advil for arthritis & Alprazolam for anxiety... SEE PROB LIST BELOW>> CXR 2/13 showed normal hear size & clear lungs, NAD... LABS 2/13 showed FLP- at goals on Lip20;  Chems- wnl;  CBC- wnl;  TSH=1.80;  VitD= 59 on 2000u supplement;  UA- clear.  02/18/2012 Acute OV  Complains of c/o sorethroat 1 wk. ago,ha,nausea off/on, nasal congestion yellow-streaks of blood,cough-green yellow thick,fcs .  Sore throat is better. No fever now.  She has been using multiple over-the-counter cold and cough. Medications with some relief. She denies any hemoptysis, orthopnea, PND, recent travel or antibiotic use.  PROBLEM LIST:    ALLERGIC RHINITIS (ICD-477.9) - on NASONEX Prn + OTC meds as needed... we discussed using an antihist (eg- Zyrtek) Qam,  saline during the day, and Nasonex Qhs... trial of Astepro for drip.  FAMILY HISTORY OF ISCHEMIC HEART DISEASE (ICD-V17.3) - takes ASA 1/d... NuclearStressTest negative in 2/05- no ischemia & EF=71%... she is active w/ Yoga, treadmill, grandkids etc- denies HA, fatigue, visual changes, CP, palipit, dizziness, syncope, dyspnea, edema, etc...   HYPERLIPIDEMIA (ICD-272.4) - on LIPITOR 20mg /d and tol well...  ~  FLP 02/22/07 showed TChol 168, TG 151, HDL 49, LDL 89 ~  FLP 2/10 showed TChol 173, TG 122, HDL 52, LDL 97 ~  FLP 2/11 showed TChol 158, TG 81, HDL 59, LDL 83 ~  FLP 2/12 showed TChol 179, TG 159, HDL 57, LDL 90 ~  FLP 2/13 on Lip20 showed TChol 172, TG 82, HDL 65, LDL 91  GERD (ICD-530.81) & GASTROPARESIS - on PROTONIX 40mg /d & Phenergan Prn (she gets occas nausea)... ~  10/09 eval by DrStark w/ EGD= normal, and Gastric Emptying Scan +for gastroparesis... Rx w/ dietary adjustment and improved... ~  11/10: she had f/u DrStark- stable, continue Protonix Rx...  IBS (ICD-564.1) & COLONIC POLYPS (ICD-211.3) ~  colonoscopy 3/06 by DrStark showed several 2-68mm polyps (path=tubular adenoms). ~  f/u colonosco[py 7/11 showed rectal polyp= hyperplastic, divertics, hem... f/u 68yrs.  DEGENERATIVE JOINT DISEASE (ICD-715.90) - uses ADVIL OTC w/ some relief... her right shoulder pain improved after she changed mattresses...  ANXIETY (ICD-300.00) - on  ALPRAZOLAM 0.5mg  as needed.  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 ~  Labs 2/13 showed Hg=14.1, MCV= 91  HEALTH MAINTENANCE:  GYN= DrRomaine and up-to-date on the needed tests... PAP s/p hyst (done Oct09), [facility] & [facility] ([facility] & last in ?2007- reported WNL)... she takes calcium gummies (intol to caltrate etc), MVI, & Vit D 1000 u daily... she gets the seasonal Flu vaccine yearly;  had Pneumovax 2002;  given TDAP 2/12...   Past Surgical History    Procedure Date  . Abdominal hysterectomy   . Left breast biopsy     benign x 2    Outpatient Encounter Prescriptions as of 02/18/2012  Medication Sig Dispense Refill  . ALPRAZolam (XANAX) 0.5 MG tablet Take 1 tablet (0.5 mg total) by mouth 3 (three) times daily as needed for anxiety.  90 tablet  5  . atorvastatin (LIPITOR) 20 MG tablet Take 1 tablet (20 mg total) by mouth daily.  90 tablet  3  . calcium gluconate 500 MG tablet Take 500 mg by mouth daily.      Jennette Banker Sodium 30-100 MG CAPS Take 2 capsules by mouth daily.      . Cholecalciferol (VITAMIN D) 2000 UNITS CAPS Take 1 capsule by mouth daily.      Marland Kitchen estradiol (VIVELLE-DOT) 0.05 MG/24HR Place 1 patch onto the skin once a week.      . Fish Oil-Krill Oil CAPS Take 1 capsule by mouth daily.      . mometasone (NASONEX) 50 MCG/ACT nasal spray Place 2 sprays into the nose daily.      . Multiple Vitamins-Minerals (MULTIPLE VITAMINS/WOMENS PO) Take 1 tablet by mouth daily.      . pantoprazole (PROTONIX) 40 MG tablet Take 1 tablet (40 mg total) by mouth daily.  90 tablet  3  . promethazine (PHENERGAN) 25 MG tablet TAKE 1/2 TO 1 TABLET BY MOUTH EVERY 4 HOURS AS NEEDED FOR NAUSEA  30 tablet  5    Allergies  Allergen Reactions  . Aspirin     Unable to take a full aspirin--causes palpitations  . Crab (Shellfish Allergy)     Rash from head to toe    Current Medications, Allergies, Past Medical History, Past Surgical History, Family History, and Social History were reviewed in [employer] record.    Review of Systems Constitutional:   No  weight loss, night sweats,  Fevers, chills, +fatigue, or  lassitude.  HEENT:   No headaches,  Difficulty swallowing,  Tooth/dental problems, or  Sore throat,                No sneezing, itching, ear ache,  +nasal congestion, post nasal drip,   CV:  No chest pain,  Orthopnea, PND, swelling in lower extremities, anasarca, dizziness, palpitations, syncope.   GI   No heartburn, indigestion, abdominal pain, nausea, vomiting, diarrhea, change in bowel habits, loss of appetite, bloody stools.   Resp:    No coughing up of blood.  Marland Kitchen  No chest wall deformity  Skin: no rash or lesions.  GU: no dysuria, change in color of urine, no urgency or frequency.  No flank pain, no hematuria   MS:  No joint pain or swelling.  No decreased range of motion.  No back pain.  Psych:  No change in mood or affect. No depression or anxiety.  No memory loss.  Objective:   Physical Exam      WD, WN, 67 y/o WF in NAD... GENERAL:  Alert & oriented; pleasant & cooperative... HEENT:  [place]/AT, EACs-clear, TMs-wnl, NOSE-clear drainage , THROAT-clear & wnl. NECK:  Supple w/ full ROM; no JVD; normal carotid impulses w/o bruits; no thyromegaly or nodules palpated; no lymphadenopathy. CHEST:  Clear to P & A; without wheezes/ rales/ or rhonchi. HEART:  Regular Rhythm; without murmurs/ rubs/ or gallops. ABDOMEN:  Soft & non-tender; normal bowel sounds; no organomegaly or masses detected. EXT: without deformities, min arthritic changes; no varicose veins/ venous insuffic/ or edema. NEURO:  no focal neuro deficits... DERM:  No lesions noted; no rash, no petechiae etc...    Assessment:

## 2012-02-18 NOTE — Assessment & Plan Note (Signed)
Acute respiratory infection versus early bronchitis.  Plan Z-Pak take as directed. Mucinex as needed. For congestion. Saline nasal spray as needed. Fluids and rest. Tylenol as needed. Followup in one month for physical as planned Please contact office for sooner follow up if symptoms do not improve or worsen or seek emergency care

## 2012-02-23 ENCOUNTER — Encounter: Payer: Self-pay | Admitting: *Deleted

## 2012-02-23 ENCOUNTER — Telehealth: Payer: Self-pay | Admitting: *Deleted

## 2012-02-23 NOTE — Telephone Encounter (Signed)
lmomtcb to make the pt aware that the jury summons letter has been completed and placed in the mail today.

## 2012-02-24 NOTE — Telephone Encounter (Signed)
Pt returned Tabitha Carlson's call.  Advised pt that jury summons letter has been completed & placed in the mail.  Pt verbalized understanding.  Tabitha Carlson

## 2012-03-15 ENCOUNTER — Other Ambulatory Visit: Payer: Self-pay | Admitting: Pulmonary Disease

## 2012-03-17 ENCOUNTER — Other Ambulatory Visit: Payer: Self-pay | Admitting: Pulmonary Disease

## 2012-03-25 ENCOUNTER — Ambulatory Visit: Payer: BC Managed Care – PPO | Admitting: Pulmonary Disease

## 2012-04-25 ENCOUNTER — Other Ambulatory Visit: Payer: Self-pay | Admitting: Pulmonary Disease

## 2012-04-25 ENCOUNTER — Telehealth: Payer: Self-pay | Admitting: Pulmonary Disease

## 2012-04-25 DIAGNOSIS — E559 Vitamin D deficiency, unspecified: Secondary | ICD-10-CM

## 2012-04-25 DIAGNOSIS — F411 Generalized anxiety disorder: Secondary | ICD-10-CM

## 2012-04-25 DIAGNOSIS — E785 Hyperlipidemia, unspecified: Secondary | ICD-10-CM

## 2012-04-25 DIAGNOSIS — D126 Benign neoplasm of colon, unspecified: Secondary | ICD-10-CM

## 2012-04-25 NOTE — Telephone Encounter (Signed)
Called and lmom to make the pt aware that her labs are in the computer and she can come by for these when she is able.  Pt to call back for any problems.

## 2012-04-27 ENCOUNTER — Other Ambulatory Visit (INDEPENDENT_AMBULATORY_CARE_PROVIDER_SITE_OTHER): Payer: Medicare Other

## 2012-04-27 DIAGNOSIS — F411 Generalized anxiety disorder: Secondary | ICD-10-CM

## 2012-04-27 DIAGNOSIS — D126 Benign neoplasm of colon, unspecified: Secondary | ICD-10-CM

## 2012-04-27 DIAGNOSIS — E785 Hyperlipidemia, unspecified: Secondary | ICD-10-CM

## 2012-04-27 DIAGNOSIS — E559 Vitamin D deficiency, unspecified: Secondary | ICD-10-CM

## 2012-04-27 LAB — LIPID PANEL
LDL Cholesterol: 91 mg/dL (ref 0–99)
Total CHOL/HDL Ratio: 4
VLDL: 37.2 mg/dL (ref 0.0–40.0)

## 2012-04-27 LAB — CBC WITH DIFFERENTIAL/PLATELET
Eosinophils Relative: 2.8 % (ref 0.0–5.0)
HCT: 40.7 % (ref 36.0–46.0)
Lymphs Abs: 2 10*3/uL (ref 0.7–4.0)
MCHC: 34.1 g/dL (ref 30.0–36.0)
MCV: 88.6 fl (ref 78.0–100.0)
Monocytes Absolute: 0.4 10*3/uL (ref 0.1–1.0)
Platelets: 192 10*3/uL (ref 150.0–400.0)
WBC: 5.2 10*3/uL (ref 4.5–10.5)

## 2012-04-27 LAB — URINALYSIS
Ketones, ur: NEGATIVE
Specific Gravity, Urine: 1.025 (ref 1.000–1.030)
Urine Glucose: NEGATIVE
pH: 5.5 (ref 5.0–8.0)

## 2012-04-27 LAB — BASIC METABOLIC PANEL
BUN: 17 mg/dL (ref 6–23)
GFR: 63.47 mL/min (ref >60.00)
Potassium: 4.1 mEq/L (ref 3.5–5.1)
Sodium: 140 mEq/L (ref 135–145)

## 2012-04-27 LAB — HEPATIC FUNCTION PANEL
AST: 22 U/L (ref 0–37)
Alkaline Phosphatase: 53 U/L (ref 39–117)
Bilirubin, Direct: 0.1 mg/dL (ref 0.0–0.3)
Total Bilirubin: 0.8 mg/dL (ref 0.3–1.2)

## 2012-04-27 LAB — TSH: TSH: 3.29 u[IU]/mL (ref 0.35–5.50)

## 2012-05-03 ENCOUNTER — Ambulatory Visit (INDEPENDENT_AMBULATORY_CARE_PROVIDER_SITE_OTHER): Payer: Medicare Other | Admitting: Pulmonary Disease

## 2012-05-03 ENCOUNTER — Encounter: Payer: Self-pay | Admitting: Pulmonary Disease

## 2012-05-03 VITALS — BP 120/70 | HR 78 | Temp 97.0°F | Ht 66.0 in | Wt 156.0 lb

## 2012-05-03 DIAGNOSIS — K219 Gastro-esophageal reflux disease without esophagitis: Secondary | ICD-10-CM

## 2012-05-03 DIAGNOSIS — K589 Irritable bowel syndrome without diarrhea: Secondary | ICD-10-CM

## 2012-05-03 DIAGNOSIS — E785 Hyperlipidemia, unspecified: Secondary | ICD-10-CM

## 2012-05-03 DIAGNOSIS — D126 Benign neoplasm of colon, unspecified: Secondary | ICD-10-CM

## 2012-05-03 DIAGNOSIS — K3184 Gastroparesis: Secondary | ICD-10-CM

## 2012-05-03 DIAGNOSIS — M1990 Unspecified osteoarthritis, unspecified site: Secondary | ICD-10-CM

## 2012-05-03 DIAGNOSIS — J309 Allergic rhinitis, unspecified: Secondary | ICD-10-CM

## 2012-05-03 DIAGNOSIS — F411 Generalized anxiety disorder: Secondary | ICD-10-CM

## 2012-05-03 MED ORDER — PROMETHAZINE HCL 25 MG PO TABS: ORAL_TABLET | ORAL | Status: DC

## 2012-05-03 MED ORDER — ATORVASTATIN CALCIUM 20 MG PO TABS: 20.0000 mg | ORAL_TABLET | Freq: Every day | ORAL | Status: DC

## 2012-05-03 MED ORDER — PANTOPRAZOLE SODIUM 40 MG PO TBEC: DELAYED_RELEASE_TABLET | ORAL | Status: DC

## 2012-05-03 MED ORDER — ALPRAZOLAM 0.5 MG PO TABS: 0.5000 mg | ORAL_TABLET | Freq: Three times a day (TID) | ORAL | Status: DC | PRN

## 2012-05-03 NOTE — Progress Notes (Signed)
Subjective:     Patient ID: Tabitha Carlson, female   DOB: 13-Dec-1945, 67 y.o.   MRN: [id number]  HPI 67 y/o WF here for a follow up visit... she has multiple medical problems as noted below...    ~  [name]:  she returns w/ gastroparesis symptoms much improved w/ dietary adjustment & lost 6# in the process... feels well, no new complaints or concerns... she's had both Flu shots this yr and requests refills for Protonix and Lipitor... ~  [name]:  she's had a good year- GI symptoms remain controlled w/ Protonix & diet regimen...  stable on Lipitor20 & low fat diet...  c/o post nasal drip- using saline + nasonex, rec adding Mucinex + Astelin... she had the 2010 Flu vaccine in Sep10.  ~  March 20, 2010:  [name] has had another good yr- feeling well w/o new complaints or concerns...  she notes some incr in anxiety & has Alpraz for Prn use;  she had colonoscopy 7/11 from Dr[facility]ark w/ hyperpl polyp, divertics, hem;  Lipids stable on diet + Lip20;  OK TDAP today...  ~  March 26, 2011:  Yearly ROV & CPX> [name] continues to do well w/o new comlaints or oncerns;  She remains active 7 denies CP, palpit, dizzy, SOB, edema, etc;  Her Chol looks great on Lip20;  GERD & gastroparesis ok w/ Protonix daily;  Up to date on colon screening from DrStark;  She uses Advil for arthritis & Alprazolam for anxiety... SEE PROB LIST BELOW>> CXR 2/13 showed normal hear size & clear lungs, NAD... LABS 2/13 showed FLP- at goals on Lip20;  Chems- wnl;  CBC- wnl;  TSH=1.80;  VitD= 59 on 2000u supplement;  UA- clear.  ~  May 03, 2012:  Yearly ROV & Alira continues to do well overall- had URI 1/14 that was hard to shake but better now;  We reviewed the following medical problems during today's office visit >>     AR> on OTC antihist prn & Nasonex; doing reasonably well this spring...    +FamHx heart dis> encouraged to take ASA81; neg stress test 2005; she is active- treadmill, grandkids, etc; denies CP, palpit, SOB, edema,  etc...    Hyperlipid> on Lip20 & FishOil; FLP 3/14 showed TChol 173, TG 186, HDL 45, LDL 91; same med, better low fat diet etc...    GI- GERD, gastroparesis, IBS, Polyps> on Protonix40, Colase, Phenergan; doing satis other than occas constip- rec to use Miralax, Senakot-S...    Gyn> on Vivelle dot; followed by [facility] & pt states she is up to date...    DJD> on OTC analgesics + calcium, MVI, VitD; no specific complaints, exercise helps she says...    Anxiety> on Xanax 0,5mg  prn...    Blood donor> she has Bneg blood type & is a freq [employer] donor... We reviewed prob list, meds, xrays and labs> see below for updates >> she had the 2013 Flu vaccine 9/13;  meds refilled per request...  LABS 3/14:  FLP- ok on Lip20 x TG=186;  Chems- wnl;  CBC- wnl;  TSH=3.29;  VitD=53;  Ua- clear...          PROBLEM LIST:    ALLERGIC RHINITIS (ICD-477.9) - on NASONEX Prn + OTC meds as needed... we discussed using an antihist (eg- Zyrtek) Qam, saline during the day, and Nasonex Qhs... trial of Astepro for drip.  FAMILY HISTORY OF ISCHEMIC HEART DISEASE (ICD-V17.3) - takes ASA 1/d... NuclearStressTest negative in 2/05- no ischemia &  EF=71%... she is active w/ Yoga, treadmill, grandkids etc- denies HA, fatigue, visual changes, CP, palipit, dizziness, syncope, dyspnea, edema, etc...   HYPERLIPIDEMIA (ICD-272.4) - on LIPITOR 20mg /d and tol well...  ~  FLP 02/22/07 showed TChol 168, TG 151, HDL 49, LDL 89 ~  FLP 2/10 showed TChol 173, TG 122, HDL 52, LDL 97 ~  FLP 2/11 showed TChol 158, TG 81, HDL 59, LDL 83 ~  FLP 2/12 showed TChol 179, TG 159, HDL 57, LDL 90 ~  FLP 2/13 on Lip20 showed TChol 172, TG 82, HDL 65, LDL 91 ~  FLP 3/13 on Lip20 showed TChol 173, TG 186, HDL 45, LDL 91  GERD (ICD-530.81) & GASTROPARESIS - on PROTONIX 40mg /d & Phenergan Prn (she gets occas nausea)... ~  10/09 eval by [facility] w/ EGD= normal, and Gastric Emptying Scan +for gastroparesis... Rx w/ dietary adjustment and improved... ~   11/10: she had f/u DrStark- stable, continue Protonix Rx...  IBS (ICD-564.1) & COLONIC POLYPS (ICD-211.3) ~  colonoscopy 3/06 by Dr[facility]ark showed several 2-92mm polyps (path=tubular adenoms). ~  f/u colonosco[py 7/11 showed rectal polyp= hyperplastic, divertics, hem... f/u 72yrs.  DEGENERATIVE JOINT DISEASE (ICD-715.90) - uses ADVIL OTC w/ some relief... her right shoulder pain improved after she changed mattresses...  ANXIETY (ICD-300.00) - on ALPRAZOLAM 0.5mg  as needed.  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 ~  Labs 2/13 showed Hg=14.1, MCV= 91 ~  Labs 3/14 showed Hg= 13.9  HEALTH MAINTENANCE:  GYN= Dr[facility] and up-to-date on the needed tests... PAP s/p hyst, [facility], & [facility] ([facility]... she takes calcium gummies (intol to caltrate etc), MVI, & Vit D 1000 u daily... she gets the seasonal Flu vaccine yearly;  had Pneumovax 2002;  given TDAP 2/12...   Past Surgical History  Procedure Laterality Date  . Abdominal hysterectomy    . Left breast biopsy      benign x 2    Outpatient Encounter Prescriptions as of 05/03/2012  Medication Sig Dispense Refill  . ALPRAZolam (XANAX) 0.5 MG tablet Take 1 tablet (0.5 mg total) by mouth 3 (three) times daily as needed for anxiety.  90 tablet  5  . atorvastatin (LIPITOR) 20 MG tablet Take 1 tablet (20 mg total) by mouth daily.  90 tablet  3  . calcium gluconate 500 MG tablet Take 500 mg by mouth daily.      Jennette Banker Sodium 30-100 MG CAPS Take 2 capsules by mouth daily.      . Cholecalciferol (VITAMIN D) 2000 UNITS CAPS Take 1 capsule by mouth daily.      Marland Kitchen estradiol (VIVELLE-DOT) 0.05 MG/24HR Place 1 patch onto the skin once a week.      . Fish Oil-Krill Oil CAPS Take 1 capsule by mouth daily.      . mometasone (NASONEX) 50 MCG/ACT nasal spray Place 2 sprays into the nose daily.      . Multiple Vitamins-Minerals (MULTIPLE VITAMINS/WOMENS PO) Take 1  tablet by mouth daily.      . pantoprazole (PROTONIX) 40 MG tablet TAKE 1 TABLET BY MOUTH DAILY  90 tablet  0  . promethazine (PHENERGAN) 25 MG tablet TAKE 1/2 TO 1 TABLET BY MOUTH EVERY 4 HOURS AS NEEDED FOR NAUSEA  30 tablet  5   No facility-administered encounter medications on file as of 05/03/2012.    Allergies  Allergen Reactions  . Aspirin     Unable to take  a full aspirin--causes palpitations  . Crab (Shellfish Allergy)     Rash from head to toe    Current Medications, Allergies, Past Medical History, Past Surgical History, Family History, and Social History were reviewed in [employer] record.    Review of Systems        The patient complains of dyspnea on exertion, gas/bloating, and anxiety.  The patient denies fever, chills, sweats, anorexia, fatigue, weakness, malaise, weight loss, sleep disorder, blurring, diplopia, eye irritation, eye discharge, vision loss, eye pain, photophobia, earache, ear discharge, tinnitus, decreased hearing, nasal congestion, nosebleeds, sore throat, hoarseness, chest pain, palpitations, syncope, orthopnea, PND, peripheral edema, cough, dyspnea at rest, excessive sputum, hemoptysis, wheezing, pleurisy, nausea, vomiting, diarrhea, constipation, change in bowel habits, abdominal pain, melena, hematochezia, jaundice, indigestion/heartburn, dysphagia, odynophagia, dysuria, hematuria, urinary frequency, urinary hesitancy, nocturia, incontinence, back pain, joint pain, joint swelling, muscle cramps, muscle weakness, stiffness, arthritis, sciatica, restless legs, leg pain at night, leg pain with exertion, rash, itching, dryness, suspicious lesions, paralysis, paresthesias, seizures, tremors, vertigo, transient blindness, frequent falls, frequent headaches, difficulty walking, depression, memory loss, confusion, cold intolerance, heat intolerance, polydipsia, polyphagia, polyuria, unusual weight change, abnormal bruising, bleeding, enlarged  lymph nodes, urticaria, allergic rash, hay fever, and recurrent infections.     Objective:   Physical Exam     WD, WN, 67 y/o WF in NAD... GENERAL:  Alert & oriented; pleasant & cooperative... HEENT:  [place]/AT, EOM-wnl, PERRLA, sl left ptosis, EACs-clear, TMs-wnl, NOSE-clear, THROAT-clear & wnl. NECK:  Supple w/ full ROM; no JVD; normal carotid impulses w/o bruits; no thyromegaly or nodules palpated; no lymphadenopathy. CHEST:  Clear to P & A; without wheezes/ rales/ or rhonchi. HEART:  Regular Rhythm; without murmurs/ rubs/ or gallops. ABDOMEN:  Soft & non-tender; normal bowel sounds; no organomegaly or masses detected. EXT: without deformities, min arthritic changes; no varicose veins/ venous insuffic/ or edema. NEURO:  CN's intact; no focal neuro deficits... DERM:  No lesions noted; no rash, no petechiae etc...  RADIOLOGY DATA:  Reviewed in the EPIC EMR & discussed w/ the patient...    LABORATORY DATA:  Reviewed in the EPIC EMR & discussed w/ the patient...    Assessment:      AR>  Stable on OTC antihist + NASONEX as needed...  Hyperlipid>  Stable on Lip20 w/ FLP parameters at goals x TG & rec better low fat diet...  GERD>  On Protonix, diet & doing satis...  IBS, Polyps>  She denies lower GI symptoms & is up to date on colon screening...  DJD>  She uses Advil OTC for relief...  Anxiety>  On Alpraz as needed...     Plan:     Patient's Medications  New Prescriptions   No medications on file  Previous Medications   CALCIUM GLUCONATE 500 MG TABLET    Take 500 mg by mouth daily.   CASANTHRANOL-DOCUSATE SODIUM 30-100 MG CAPS    Take 2 capsules by mouth daily.   CHOLECALCIFEROL (VITAMIN D) 2000 UNITS CAPS    Take 1 capsule by mouth daily.   ESTRADIOL (VIVELLE-DOT) 0.05 MG/24HR    Place 1 patch onto the skin once a week.   FISH OIL-KRILL OIL CAPS    Take 1 capsule by mouth daily.   MOMETASONE (NASONEX) 50 MCG/ACT NASAL SPRAY    Place 2 sprays into the nose daily.   MULTIPLE  VITAMINS-MINERALS (MULTIPLE VITAMINS/WOMENS PO)    Take 1 tablet by mouth daily.  Modified Medications   Modified Medication Previous  Medication   ALPRAZOLAM (XANAX) 0.5 MG TABLET ALPRAZolam (XANAX) 0.5 MG tablet      Take 1 tablet (0.5 mg total) by mouth 3 (three) times daily as needed for anxiety.    Take 1 tablet (0.5 mg total) by mouth 3 (three) times daily as needed for anxiety.   ATORVASTATIN (LIPITOR) 20 MG TABLET atorvastatin (LIPITOR) 20 MG tablet      TAKE 1 TABLET BY MOUTH DAILY    Take 1 tablet (20 mg total) by mouth daily.   PANTOPRAZOLE (PROTONIX) 40 MG TABLET pantoprazole (PROTONIX) 40 MG tablet      TAKE 1 TABLET BY MOUTH DAILY    TAKE 1 TABLET BY MOUTH DAILY   PROMETHAZINE (PHENERGAN) 25 MG TABLET promethazine (PHENERGAN) 25 MG tablet      TAKE 1/2 TO 1 TABLET BY MOUTH EVERY 4 HOURS AS NEEDED FOR NAUSEA    TAKE 1/2 TO 1 TABLET BY MOUTH EVERY 4 HOURS AS NEEDED FOR NAUSEA  Discontinued Medications   No medications on file

## 2012-05-03 NOTE — Patient Instructions (Addendum)
Today we updated your med list in our EPIC system...    Continue your current medications the same...    We refilled your meds per request....  We reviewed your recent blood work & gave you a copy for your records...  Call for any questions...  Let's plan a follow up visit in 37yr, sooner if needed for problems.Marland KitchenMarland Kitchen

## 2012-05-10 ENCOUNTER — Other Ambulatory Visit: Payer: Self-pay | Admitting: Pulmonary Disease

## 2012-10-26 ENCOUNTER — Ambulatory Visit (INDEPENDENT_AMBULATORY_CARE_PROVIDER_SITE_OTHER): Payer: Medicare Other

## 2012-10-26 DIAGNOSIS — Z23 Encounter for immunization: Secondary | ICD-10-CM

## 2012-11-10 ENCOUNTER — Other Ambulatory Visit: Payer: Self-pay | Admitting: Pulmonary Disease

## 2012-11-24 ENCOUNTER — Telehealth: Payer: Self-pay | Admitting: Pulmonary Disease

## 2012-11-24 ENCOUNTER — Other Ambulatory Visit (INDEPENDENT_AMBULATORY_CARE_PROVIDER_SITE_OTHER): Payer: Medicare Other

## 2012-11-24 DIAGNOSIS — R300 Dysuria: Secondary | ICD-10-CM

## 2012-11-24 LAB — URINALYSIS
Bilirubin Urine: NEGATIVE
Ketones, ur: NEGATIVE
Total Protein, Urine: NEGATIVE
Urine Glucose: NEGATIVE
pH: 7 (ref 5.0–8.0)

## 2012-11-24 NOTE — Telephone Encounter (Signed)
Per SN---  Please have the pt come in for ua and urine culture today.  Orders have been placed.  i have called and spoke with pt and she will come in today for these labs.

## 2012-11-24 NOTE — Telephone Encounter (Signed)
Last OV 05-03-12. Pt called today c/o having pain in her lower abdomen and increased frequency in urinating. Pt denies any pain with urination, no burning, no blood. Pt is requesting to come in and leave a sample. Please advise. Carron Curie, CMA Allergies  Allergen Reactions  . Aspirin     Unable to take a full aspirin--causes palpitations  . Crab [Shellfish Allergy]     Rash from head to toe

## 2012-11-25 NOTE — Telephone Encounter (Signed)
Called and spoke with pt and she is aware that UA was negative.  She stated that she has been drinking lots of cranberry juice and she has not had any symptoms today. She is aware that we will wait for the urine culture to come back on Monday and pt requested to be  Called on her cell   [phone] since she will be out of town all next week.  i also told her that she can try azo otc if the symptoms come back over the weekend.  Pt is aware.

## 2012-12-19 ENCOUNTER — Other Ambulatory Visit: Payer: Self-pay

## 2012-12-19 DIAGNOSIS — Z1231 Encounter for screening mammogram for malignant neoplasm of breast: Secondary | ICD-10-CM

## 2012-12-29 ENCOUNTER — Encounter: Payer: Self-pay | Admitting: Gynecology

## 2012-12-30 ENCOUNTER — Ambulatory Visit: Payer: Self-pay | Admitting: Obstetrics and Gynecology

## 2012-12-30 ENCOUNTER — Ambulatory Visit (INDEPENDENT_AMBULATORY_CARE_PROVIDER_SITE_OTHER): Payer: Medicare Other | Admitting: Gynecology

## 2012-12-30 ENCOUNTER — Encounter: Payer: Self-pay | Admitting: Gynecology

## 2012-12-30 VITALS — BP 126/70 | HR 80 | Resp 18 | Ht 65.5 in | Wt 159.0 lb

## 2012-12-30 DIAGNOSIS — Z01419 Encounter for gynecological examination (general) (routine) without abnormal findings: Secondary | ICD-10-CM

## 2012-12-30 DIAGNOSIS — N951 Menopausal and female climacteric states: Secondary | ICD-10-CM

## 2012-12-30 MED ORDER — ESTRADIOL 0.05 MG/24HR TD PTWK: 0.0500 mg | MEDICATED_PATCH | TRANSDERMAL | Status: DC

## 2012-12-30 NOTE — Progress Notes (Signed)
67 y.o. Married Caucasian female   [id number] here for annual exam. Pt reports menses are absent.  She does not report hot flashes, does not have night sweats, does have vaginal dryness.  She is using lubricants..  She does not report post-menopasual bleeding. Pt reports some bleeding after sex-pink related to dryness, is using otc lubricant.   Happy with estrogen patch, would like to continue. No LMP recorded.          Sexually active: yes  The current method of family planning is post status.    Exercising: yes  silver sneakers 4-5x/wk Last pap: 05/31/2001 Negative  Abnormal PAP: no Mammogram: 01/01/12 Bi-Rads 1 scheduled for 01/17/13  BSE: q 2-3 months Colonoscopy: 2011 f/u q 5 years DEXA: 12/2010 Alcohol: no Tobacco: no  Hgb: PCP ; Urine: PCP  Health Maintenance  Topic Date Due  . Colonoscopy  01/18/1997  . Zostavax  01/19/2007  . Pneumococcal Polysaccharide Vaccine Age 72 And Over  01/19/2012  . Influenza Vaccine  09/09/2013  . Mammogram  12/30/2013  . Tetanus/tdap  03/20/2020    Family History  Problem Relation Age of Onset  . Leukemia Mother     Patient Active Problem List   Diagnosis Date Noted  . Acute URI 02/18/2012  . GASTROPARESIS 01/02/2008  . ALLERGIC RHINITIS 09/12/2007  . COLONIC POLYPS 03/16/2007  . GERD 03/16/2007  . HYPERLIPIDEMIA 02/21/2007  . ANXIETY 02/21/2007  . IBS 02/21/2007  . DEGENERATIVE JOINT DISEASE 02/21/2007    Past Medical History  Diagnosis Date  . Allergic rhinitis   . Family history of ischemic heart disease   . Hyperlipidemia   . GERD (gastroesophageal reflux disease)   . Gastroparesis   . IBS (irritable bowel syndrome)   . Hx of colonic polyps   . DJD (degenerative joint disease)   . Anxiety   . Blood donor   . Hypercholesteremia   . Arthritis     in back  . Depression     Past Surgical History  Procedure Laterality Date  . Left breast biopsy      benign x 2  . Breast biopsy  1980, 1983    Benign Breast Biospy x 2   . Cyst excision  04/2011    Thigh  . Abdominal hysterectomy  1985    TAH/BSO Age 63 DUB, pelvic pain    Allergies: Aspirin and Crab  Current Outpatient Prescriptions  Medication Sig Dispense Refill  . ALPRAZolam (XANAX) 0.5 MG tablet TAKE 1 TABLET BY MOUTH 3 TIMES A DAY AS NEEDED FOR ANIXETY  90 tablet  5  . atorvastatin (LIPITOR) 20 MG tablet TAKE 1 TABLET BY MOUTH DAILY  90 tablet  3  . calcium gluconate 500 MG tablet Take 500 mg by mouth daily.      . Cholecalciferol (VITAMIN D) 2000 UNITS CAPS Take 1 capsule by mouth daily.      Marland Kitchen estradiol (VIVELLE-DOT) 0.05 MG/24HR Place 1 patch onto the skin once a week.      . Fish Oil-Krill Oil CAPS Take 1 capsule by mouth daily.      . Multiple Vitamins-Minerals (MULTIPLE VITAMINS/WOMENS PO) Take 1 tablet by mouth daily.      . pantoprazole (PROTONIX) 40 MG tablet TAKE 1 TABLET BY MOUTH DAILY  90 tablet  3  . promethazine (PHENERGAN) 25 MG tablet TAKE 1/2 TO 1 TABLET BY MOUTH EVERY 4 HOURS AS NEEDED FOR NAUSEA  30 tablet  5  . Casanthranol-Docusate Sodium 30-100 MG CAPS Take  2 capsules by mouth daily.      . mometasone (NASONEX) 50 MCG/ACT nasal spray Place 2 sprays into the nose daily.       No current facility-administered medications for this visit.    ROS: Pertinent items are noted in HPI.  Exam:    BP 126/70  Pulse 80  Resp 18  Ht 5' 5.5" (1.664 m)  Wt 159 lb (72.122 kg)  BMI 26.05 kg/m2 Weight change: @WEIGHTCHANGE @ Last 3 height recordings:  Ht Readings from Last 3 Encounters:  12/30/12 5' 5.5" (1.664 m)  05/03/12 5\' 6"  (1.676 m)  02/18/12 5\' 6"  (1.676 m)   General appearance: alert, cooperative and appears stated age Head: Normocephalic, without obvious abnormality, atraumatic Neck: no adenopathy, no carotid bruit, no JVD, supple, symmetrical, trachea midline and thyroid not enlarged, symmetric, no tenderness/mass/nodules Lungs: clear to auscultation bilaterally Breasts: normal appearance, no masses or tenderness Heart:  regular rate and rhythm, S1, S2 normal, no murmur, click, rub or gallop Abdomen: soft, non-tender; bowel sounds normal; no masses,  no organomegaly Extremities: extremities normal, atraumatic, no cyanosis or edema Skin: Skin color, texture, turgor normal. No rashes or lesions Lymph nodes: Cervical, supraclavicular, and axillary nodes normal. no inguinal nodes palpated Neurologic: Grossly normal   Pelvic: External genitalia:  no lesions              Urethra: normal appearing urethra with no masses, tenderness or lesions              Bartholins and Skenes: normal                 Vagina: normal appearing vagina with normal color and discharge, no lesions              Cervix: absent              Pap taken: no        Bimanual Exam:  Uterus:  absent                                      Adnexa:    no masses                                      Rectovaginal: Confirms                                      Anus:  normal sphincter tone, no lesions  A: well woman Menopausal symptoms Vaginal atrophy   P: mammogram pap smear not done lubircants discussed-EVOO, cocoanut oil counseled on breast self exam, mammography screening, menopause, adequate intake of calcium and vitamin D, diet and exercise return annually or prn Discussed PAP guideline changes, importance of weight bearing exercises, calcium, vit D and balanced diet.  An After Visit Summary was printed and given to the patient.

## 2012-12-30 NOTE — Patient Instructions (Signed)

## 2013-01-17 ENCOUNTER — Ambulatory Visit
Admission: RE | Admit: 2013-01-17 | Discharge: 2013-01-17 | Disposition: A | Discharge status: Home / Self Care | Payer: Medicare Other | Source: Ambulatory Visit

## 2013-01-17 DIAGNOSIS — Z1231 Encounter for screening mammogram for malignant neoplasm of breast: Secondary | ICD-10-CM

## 2013-01-19 ENCOUNTER — Telehealth: Payer: Self-pay | Admitting: Emergency Medicine

## 2013-01-19 NOTE — Telephone Encounter (Signed)
Spoke with patient. Advised of message. She is grateful we are working on authorization.

## 2013-01-19 NOTE — Telephone Encounter (Signed)
Message to patient to call back.  Received medical coverage prior authorization for Estradiol Patches from 01/10/13-01/10/14. Notified [facility]. They ran rx and cost will be 46.99/month. [facility] suggested she may be in "donut hole or coverage gap" for this year, which could explain copay.

## 2013-03-11 ENCOUNTER — Other Ambulatory Visit: Payer: Self-pay | Admitting: Pulmonary Disease

## 2013-05-04 ENCOUNTER — Ambulatory Visit: Payer: Medicare Other | Admitting: Pulmonary Disease

## 2013-05-08 ENCOUNTER — Encounter (INDEPENDENT_AMBULATORY_CARE_PROVIDER_SITE_OTHER): Payer: Self-pay

## 2013-05-08 ENCOUNTER — Other Ambulatory Visit (INDEPENDENT_AMBULATORY_CARE_PROVIDER_SITE_OTHER): Payer: Medicare HMO

## 2013-05-08 ENCOUNTER — Ambulatory Visit (INDEPENDENT_AMBULATORY_CARE_PROVIDER_SITE_OTHER): Payer: Medicare HMO | Admitting: Pulmonary Disease

## 2013-05-08 ENCOUNTER — Encounter: Payer: Self-pay | Admitting: Pulmonary Disease

## 2013-05-08 VITALS — BP 116/82 | HR 76 | Temp 97.1°F | Ht 66.0 in | Wt 158.0 lb

## 2013-05-08 DIAGNOSIS — K219 Gastro-esophageal reflux disease without esophagitis: Secondary | ICD-10-CM

## 2013-05-08 DIAGNOSIS — J309 Allergic rhinitis, unspecified: Secondary | ICD-10-CM

## 2013-05-08 DIAGNOSIS — F411 Generalized anxiety disorder: Secondary | ICD-10-CM

## 2013-05-08 DIAGNOSIS — K3184 Gastroparesis: Secondary | ICD-10-CM

## 2013-05-08 DIAGNOSIS — E785 Hyperlipidemia, unspecified: Secondary | ICD-10-CM

## 2013-05-08 DIAGNOSIS — D126 Benign neoplasm of colon, unspecified: Secondary | ICD-10-CM

## 2013-05-08 DIAGNOSIS — M1990 Unspecified osteoarthritis, unspecified site: Secondary | ICD-10-CM

## 2013-05-08 LAB — HEPATIC FUNCTION PANEL
ALT: 21 U/L (ref 0–35)
AST: 25 U/L (ref 0–37)
Albumin: 4.7 g/dL (ref 3.5–5.2)
Alkaline Phosphatase: 49 U/L (ref 39–117)
BILIRUBIN DIRECT: 0.1 mg/dL (ref 0.0–0.3)
BILIRUBIN TOTAL: 0.5 mg/dL (ref 0.3–1.2)
TOTAL PROTEIN: 7.5 g/dL (ref 6.0–8.3)

## 2013-05-08 LAB — BASIC METABOLIC PANEL
BUN: 10 mg/dL (ref 6–23)
CO2: 30 mEq/L (ref 19–32)
CREATININE: 0.8 mg/dL (ref 0.4–1.2)
Calcium: 9.9 mg/dL (ref 8.4–10.5)
Chloride: 102 mEq/L (ref 96–112)
GFR: 75.12 mL/min (ref >60.00)
Glucose, Bld: 99 mg/dL (ref 70–99)
Potassium: 4.4 mEq/L (ref 3.5–5.1)
Sodium: 139 mEq/L (ref 135–145)

## 2013-05-08 LAB — LIPID PANEL
CHOL/HDL RATIO: 3
Cholesterol: 167 mg/dL (ref 0–200)
HDL: 54.4 mg/dL (ref >39.00)
LDL CALC: 85 mg/dL (ref 0–99)
Triglycerides: 138 mg/dL (ref 0.0–149.0)
VLDL: 27.6 mg/dL (ref 0.0–40.0)

## 2013-05-08 LAB — TSH: TSH: 1.04 u[IU]/mL (ref 0.35–5.50)

## 2013-05-08 MED ORDER — PROMETHAZINE HCL 25 MG PO TABS: ORAL_TABLET | ORAL | Status: DC

## 2013-05-08 MED ORDER — ATORVASTATIN CALCIUM 20 MG PO TABS: ORAL_TABLET | ORAL | Status: DC

## 2013-05-08 MED ORDER — ALPRAZOLAM 0.5 MG PO TABS: ORAL_TABLET | ORAL | Status: DC

## 2013-05-08 MED ORDER — PANTOPRAZOLE SODIUM 40 MG PO TBEC: DELAYED_RELEASE_TABLET | ORAL | Status: DC

## 2013-05-08 NOTE — Progress Notes (Signed)
Subjective:     Patient ID: Tabitha Carlson, female   DOB: 05-27-1945, 68 y.o.   MRN: [id number]  HPI 68 y/o WF here for a follow up visit... she has multiple medical problems as noted below...    ~  [name]:  she returns w/ gastroparesis symptoms much improved w/ dietary adjustment & lost 6# in the process... feels well, no new complaints or concerns... she's had both Flu shots this yr and requests refills for Protonix and Lipitor... ~  [id number]:  she's had a good year- GI symptoms remain controlled w/ Protonix & diet regimen...  stable on Lipitor20 & low fat diet...  c/o post nasal drip- using saline + nasonex, rec adding Mucinex + Astelin... she had the 2010 Flu vaccine in Sep10. ~  Feb12:  Tabitha Carlson has had another good yr- she notes some incr in anxiety & has Alpraz for Prn use; she had colonoscopy 7/11 from DrStark w/ hyperpl polyp, divertics, hem;  Lipids stable on diet + Lip20;  OK TDAP today...  ~  March 26, 2011:  Yearly ROV & CPX> Tabitha Carlson continues to do well w/o new comlaints or oncerns;  She remains active 7 denies CP, palpit, dizzy, SOB, edema, etc;  Her Chol looks great on Lip20;  GERD & gastroparesis ok w/ Protonix daily;  Up to date on colon screening from DrStark;  She uses Advil for arthritis & Alprazolam for anxiety... SEE PROB LIST BELOW>>  CXR 2/13 showed normal hear size & clear lungs, NAD.Marland KitchenMarland Kitchen  EKG 2/13 showed NSR, rate67, wnl, NAD...  LABS 2/13 showed FLP- at goals on Lip20;  Chems- wnl;  CBC- wnl;  TSH=1.80;  VitD= 59 on 2000u supplement;  UA- clear.  ~  May 03, 2012:  Yearly Gleason continues to do well overall- had URI 1/14 that was hard to shake but better now;  We reviewed the following medical problems during today's office visit >>     AR> on OTC antihist prn & Nasonex; doing reasonably well this spring...    +FamHx heart dis> encouraged to take ASA81; neg stress test 2005; she is active- treadmill, grandkids, etc; denies CP, palpit, SOB, edema, etc...    Hyperlipid> on  Lip20 & FishOil; FLP 3/14 showed TChol 173, TG 186, HDL 45, LDL 91; same med, better low fat diet etc...    GI- GERD, gastroparesis, IBS, Polyps> on Protonix40, Colase, Phenergan; doing satis other than occas constip- rec to use Miralax, Senakot-S...    Gyn> on Vivelle dot; followed by [facility] & pt states she is up to date...    DJD> on OTC analgesics + calcium, MVI, VitD; no specific complaints, exercise helps she says...    Anxiety> on Xanax 0,5mg  prn...    Blood donor> she has Bneg blood type & is a freq [employer] donor... We reviewed prob list, meds, xrays and labs> see below for updates >> she had the 2013 Flu vaccine 9/13;  meds refilled per request...   LABS 3/14:  FLP- ok on Lip20 x TG=186;  Chems- wnl;  CBC- wnl;  TSH=3.29;  VitD=53;  Ua- clear...  ~  May 08, 2013:  Yearly Talmage reports doing well overall- she saw GYN 11/14 w/ menopausal symptoms and some dryness/ atrophy on Climara patch & lubricants... We reviewed the following medical problems during today's office visit >>     AR> on OTC antihist prn & Nasonex; doing reasonably well this spring...    +FamHx heart dis> encouraged to take  VFI43; neg stress test 2005; she is active- treadmill, grandkids, etc; denies CP, palpit, SOB, edema, etc...    Hyperlipid> on Lip20 & KrillOil; FLP 3/15 showed TChol 167, TG 138, HDL 54, LDL 85; same med, better low fat diet etc...    GI- GERD, gastroparesis, IBS, Polyps> on Protonix40, Colase, Phenergan; doing satis other than occas constip- rec to use Miralax, Senakot-S... f/u colon due 7/16.    Gyn> on Climara patch; followed by DrRomaine & seen 11/14- note reviewed...    DJD> on OTC analgesics + calcium, MVI, VitD; no specific complaints, exercise helps she says...    Anxiety> on Xanax 0,5mg  prn...    Blood donor> she has Bneg blood type & is a freq [employer] donor...    DERM> she has a benign looking varrucous skin lesion on left thigh, prev seen by derm, not much change over the  yrs... We reviewed prob list, meds, xrays and labs> see below for updates >> she gets the yearly Flu shot each autumn; meds refilled for 90d supplies...  LABS 3/15:  FLP- at goals on Lip20;  Chems- wnl;  CBC- wnl;  TSH=1.04...          PROBLEM LIST:    ALLERGIC RHINITIS (ICD-477.9) - on NASONEX Prn + OTC meds as needed... we discussed using an antihist (eg- Zyrtek) Qam, saline during the day, and Nasonex Qhs... trial of Astepro for drip.  FAMILY HISTORY OF ISCHEMIC HEART DISEASE (ICD-V17.3) - takes ASA 1/d... NuclearStressTest negative in 2/05- no ischemia & EF=71%... she is active w/ Yoga, treadmill, grandkids etc- denies HA, fatigue, visual changes, CP, palipit, dizziness, syncope, dyspnea, edema, etc...   HYPERLIPIDEMIA (ICD-272.4) - on LIPITOR 20mg /d and tol well...  ~  [place] 02/22/07 showed TChol 168, TG 151, HDL 49, LDL 89 ~  FLP 2/10 showed TChol 173, TG 122, HDL 52, LDL 97 ~  FLP 2/11 showed TChol 158, TG 81, HDL 59, LDL 83 ~  FLP 2/12 showed TChol 179, TG 159, HDL 57, LDL 90 ~  FLP 2/13 on Lip20 showed TChol 172, TG 82, HDL 65, LDL 91 ~  FLP 3/14 on Lip20 showed TChol 173, TG 186, HDL 45, LDL 91 ~  FLP 3/15 on Lip20 showed  TChol 167, TG 138, HDL 54, LDL 85  GERD (ICD-530.81) & GASTROPARESIS - on PROTONIX 40mg /d & Phenergan Prn (she gets occas nausea)... ~  10/09 eval by DrStark w/ EGD= normal, and Gastric Emptying Scan +for gastroparesis... Rx w/ dietary adjustment and improved... ~  11/10: she had f/u DrStark- stable, continue Protonix Rx...  IBS (ICD-564.1) & COLONIC POLYPS (ICD-211.3) ~  colonoscopy 3/06 by DrStark showed several 2-52mm polyps (path=tubular adenoms). ~  f/u colonosco[py 7/11 showed rectal polyp= hyperplastic, divertics, hem... f/u 68yrs.  DEGENERATIVE JOINT DISEASE (ICD-715.90) - uses ADVIL OTC w/ some relief... her right shoulder pain improved after she changed mattresses...  ANXIETY (ICD-300.00) - on ALPRAZOLAM 0.5mg  as needed.  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 ~  Labs 2/13 showed Hg=14.1, MCV= 91 ~  Labs 3/14 showed Hg= 13.9 ~  Labs 3/15 showed Hg= 13.9  HEALTH MAINTENANCE:  GYN= DrRomaine and up-to-date on the needed tests... PAP s/p hyst, Mammograms at the [place], & BMD (also done at the [place]... she takes calcium gummies (intol to caltrate etc), MVI, & Vit D 1000 u daily... she gets the seasonal Flu vaccine yearly;  had Pneumovax 2002;  given TDAP 2/12.Marland KitchenMarland Kitchen  Past Surgical History  Procedure Laterality Date  . Left breast biopsy      benign x 2  . Breast biopsy  1980, 1983    Benign Breast Biospy x 2  . Cyst excision  04/2011    Thigh  . Abdominal hysterectomy  1985    TAH/BSO Age 72 DUB, pelvic pain    Outpatient Encounter Prescriptions as of 05/08/2013  Medication Sig  . ALPRAZolam (XANAX) 0.5 MG tablet TAKE 1 TABLET BY MOUTH 3 TIMES A DAY AS NEEDED FOR ANIXETY  . atorvastatin (LIPITOR) 20 MG tablet TAKE 1 TABLET BY MOUTH DAILY  . calcium gluconate 500 MG tablet Take 500 mg by mouth daily.  [name] Sodium 30-100 MG CAPS Take 2 capsules by mouth daily.  . Cholecalciferol (VITAMIN D) 2000 UNITS CAPS Take 1 capsule by mouth daily.  Marland Kitchen estradiol (CLIMARA - DOSED IN MG/24 HR) 0.05 mg/24hr patch Place 1 patch (0.05 mg total) onto the skin once a week.  . Fish Oil-Krill Oil CAPS Take 1 capsule by mouth daily.  . mometasone (NASONEX) 50 MCG/ACT nasal spray Place 2 sprays into the nose daily.  . Multiple Vitamins-Minerals (MULTIPLE VITAMINS/WOMENS PO) Take 1 tablet by mouth daily.  . pantoprazole (PROTONIX) 40 MG tablet TAKE 1 TABLET BY MOUTH DAILY  . promethazine (PHENERGAN) 25 MG tablet TAKE 1/2 TO 1 TABLET BY MOUTH EVERY 4 HOURS AS NEEDED FOR NAUSEA  . [DISCONTINUED] pantoprazole (PROTONIX) 40 MG tablet TAKE 1 TABLET BY MOUTH DAILY    Allergies  Allergen Reactions  . Aspirin     Unable to take a full aspirin--causes palpitations Tachycardia  .  Crab [Shellfish Allergy]     Rash from head to toe    Current Medications, Allergies, Past Medical History, Past Surgical History, Family History, and Social History were reviewed in [employer] record.    Review of Systems        The patient complains of dyspnea on exertion, gas/bloating, and anxiety.  The patient denies fever, chills, sweats, anorexia, fatigue, weakness, malaise, weight loss, sleep disorder, blurring, diplopia, eye irritation, eye discharge, vision loss, eye pain, photophobia, earache, ear discharge, tinnitus, decreased hearing, nasal congestion, nosebleeds, sore throat, hoarseness, chest pain, palpitations, syncope, orthopnea, PND, peripheral edema, cough, dyspnea at rest, excessive sputum, hemoptysis, wheezing, pleurisy, nausea, vomiting, diarrhea, constipation, change in bowel habits, abdominal pain, melena, hematochezia, jaundice, indigestion/heartburn, dysphagia, odynophagia, dysuria, hematuria, urinary frequency, urinary hesitancy, nocturia, incontinence, back pain, joint pain, joint swelling, muscle cramps, muscle weakness, stiffness, arthritis, sciatica, restless legs, leg pain at night, leg pain with exertion, rash, itching, dryness, suspicious lesions, paralysis, paresthesias, seizures, tremors, vertigo, transient blindness, frequent falls, frequent headaches, difficulty walking, depression, memory loss, confusion, cold intolerance, heat intolerance, polydipsia, polyphagia, polyuria, unusual weight change, abnormal bruising, bleeding, enlarged lymph nodes, urticaria, allergic rash, hay fever, and recurrent infections.     Objective:   Physical Exam     WD, WN, 68 y/o WF in NAD... GENERAL:  Alert & oriented; pleasant & cooperative... HEENT:  [place]/AT, EOM-wnl, PERRLA, sl left ptosis, EACs-clear, TMs-wnl, NOSE-clear, THROAT-clear & wnl. NECK:  Supple w/ full ROM; no JVD; normal carotid impulses w/o bruits; no thyromegaly or nodules palpated; no  lymphadenopathy. CHEST:  Clear to P & A; without wheezes/ rales/ or rhonchi. HEART:  Regular Rhythm; without murmurs/ rubs/ or gallops. ABDOMEN:  Soft & non-tender; normal bowel sounds; no organomegaly or masses detected. EXT: without deformities, min arthritic changes; no varicose veins/ venous insuffic/ or edema.  NEURO:  CN's intact; no focal neuro deficits... DERM:  No lesions noted; no rash, no petechiae etc...  RADIOLOGY DATA:  Reviewed in the EPIC EMR & discussed w/ the patient...    LABORATORY DATA:  Reviewed in the EPIC EMR & discussed w/ the patient...    Assessment:      AR>  Stable on OTC antihist + NASONEX as needed...  Hyperlipid>  Stable on Lip20 w/ FLP parameters at goals x TG & rec better low fat diet...  GERD>  On Protonix, diet & doing satis...  IBS, Polyps>  She denies lower GI symptoms & is up to date on colon screening...  DJD>  She uses Advil OTC for relief...  Anxiety>  On Alpraz as needed...     Plan:     Patient's Medications  New Prescriptions   No medications on file  Previous Medications   CALCIUM GLUCONATE 500 MG TABLET    Take 500 mg by mouth daily.   CASANTHRANOL-DOCUSATE SODIUM 30-100 MG CAPS    Take 2 capsules by mouth daily.   CHOLECALCIFEROL (VITAMIN D) 2000 UNITS CAPS    Take 1 capsule by mouth daily.   ESTRADIOL (CLIMARA - DOSED IN MG/24 HR) 0.05 MG/24HR PATCH    Place 1 patch (0.05 mg total) onto the skin once a week.   FISH OIL-KRILL OIL CAPS    Take 1 capsule by mouth daily.   MULTIPLE VITAMINS-MINERALS (MULTIPLE VITAMINS/WOMENS PO)    Take 1 tablet by mouth daily.  Modified Medications   Modified Medication Previous Medication   ALPRAZOLAM (XANAX) 0.5 MG TABLET ALPRAZolam (XANAX) 0.5 MG tablet      TAKE 1 TABLET BY MOUTH 3 TIMES A DAY AS NEEDED FOR ANIXETY    TAKE 1 TABLET BY MOUTH 3 TIMES A DAY AS NEEDED FOR ANIXETY   ATORVASTATIN (LIPITOR) 20 MG TABLET atorvastatin (LIPITOR) 20 MG tablet      TAKE 1 TABLET BY MOUTH DAILY    TAKE  1 TABLET BY MOUTH DAILY   ATORVASTATIN (LIPITOR) 20 MG TABLET atorvastatin (LIPITOR) 20 MG tablet      TAKE 1 TABLET (20 MG TOTAL) BY MOUTH DAILY.    Take 1 tablet (20 mg total) by mouth daily.   MOMETASONE (NASONEX) 50 MCG/ACT NASAL SPRAY mometasone (NASONEX) 50 MCG/ACT nasal spray      Place 2 sprays into the nose daily.    Place 2 sprays into the nose daily.   PANTOPRAZOLE (PROTONIX) 40 MG TABLET pantoprazole (PROTONIX) 40 MG tablet      TAKE 1 TABLET BY MOUTH DAILY    TAKE 1 TABLET BY MOUTH DAILY   PROMETHAZINE (PHENERGAN) 25 MG TABLET promethazine (PHENERGAN) 25 MG tablet      TAKE 1/2 TO 1 TABLET BY MOUTH EVERY 4 HOURS AS NEEDED FOR NAUSEA    TAKE 1/2 TO 1 TABLET BY MOUTH EVERY 4 HOURS AS NEEDED FOR NAUSEA  Discontinued Medications   PANTOPRAZOLE (PROTONIX) 40 MG TABLET    TAKE 1 TABLET BY MOUTH DAILY

## 2013-05-08 NOTE — Patient Instructions (Signed)
Today we updated your med list in our EPIC system...    Continue your current medications the same...    We refilled your meds per request...  Today we did your follow up FASTING blood work...    We will contact you w/ the results when available...   Call for any questions or if we can be of service in any way... 

## 2013-05-09 LAB — CBC WITH DIFFERENTIAL/PLATELET
Basophils Absolute: 0 10*3/uL (ref 0.0–0.1)
Basophils Relative: 0.3 % (ref 0.0–3.0)
EOS ABS: 0.1 10*3/uL (ref 0.0–0.7)
Eosinophils Relative: 1.5 % (ref 0.0–5.0)
HCT: 41.5 % (ref 36.0–46.0)
HEMOGLOBIN: 13.9 g/dL (ref 12.0–15.0)
LYMPHS PCT: 31.8 % (ref 12.0–46.0)
Lymphs Abs: 1.6 10*3/uL (ref 0.7–4.0)
MCHC: 33.6 g/dL (ref 30.0–36.0)
MCV: 91.2 fl (ref 78.0–100.0)
Monocytes Absolute: 0.2 10*3/uL (ref 0.1–1.0)
Monocytes Relative: 4.8 % (ref 3.0–12.0)
Neutro Abs: 3 10*3/uL (ref 1.4–7.7)
Neutrophils Relative %: 61.6 % (ref 43.0–77.0)
Platelets: 192 10*3/uL (ref 150.0–400.0)
RBC: 4.55 Mil/uL (ref 3.87–5.11)
RDW: 12.8 % (ref 11.5–14.6)
WBC: 4.9 10*3/uL (ref 4.5–10.5)

## 2013-05-11 ENCOUNTER — Telehealth: Payer: Self-pay | Admitting: Pulmonary Disease

## 2013-05-11 MED ORDER — MOMETASONE FUROATE 50 MCG/ACT NA SUSP: 2.0000 | Freq: Every day | NASAL | Status: DC

## 2013-05-11 NOTE — Telephone Encounter (Signed)
Rx has been sent in. Pt is aware. 

## 2013-06-06 ENCOUNTER — Other Ambulatory Visit: Payer: Self-pay | Admitting: Pulmonary Disease

## 2013-10-20 ENCOUNTER — Encounter: Payer: Self-pay | Admitting: Family Medicine

## 2013-10-20 ENCOUNTER — Ambulatory Visit (INDEPENDENT_AMBULATORY_CARE_PROVIDER_SITE_OTHER): Payer: Medicare HMO | Admitting: Family Medicine

## 2013-10-20 VITALS — BP 130/80 | HR 88 | Temp 98.7°F | Ht 66.0 in | Wt 153.0 lb

## 2013-10-20 DIAGNOSIS — Z23 Encounter for immunization: Secondary | ICD-10-CM

## 2013-10-20 DIAGNOSIS — M8580 Other specified disorders of bone density and structure, unspecified site: Secondary | ICD-10-CM | POA: Insufficient documentation

## 2013-10-20 DIAGNOSIS — K219 Gastro-esophageal reflux disease without esophagitis: Secondary | ICD-10-CM

## 2013-10-20 DIAGNOSIS — E785 Hyperlipidemia, unspecified: Secondary | ICD-10-CM

## 2013-10-20 DIAGNOSIS — F411 Generalized anxiety disorder: Secondary | ICD-10-CM

## 2013-10-20 DIAGNOSIS — M899 Disorder of bone, unspecified: Secondary | ICD-10-CM

## 2013-10-20 DIAGNOSIS — M949 Disorder of cartilage, unspecified: Secondary | ICD-10-CM

## 2013-10-20 NOTE — Progress Notes (Signed)
Pre visit review using our clinic review tool, if applicable. No additional management support is needed unless otherwise documented below in the visit note. 

## 2013-10-20 NOTE — Progress Notes (Signed)
Subjective:    Patient ID: Tabitha Carlson, female    DOB: September 27, 1945, 68 y.o.   MRN: [id number]  HPI Here to est as a new primary care pt from Dr Lenna Gilford  Lives close to here    [facility] is down 5 lb from last visit with bmi of 24.7 Had a bad stomach bug this summer - lost almost 10 lb and gained some back     BP Readings from Last 3 Encounters:  10/20/13 144/76  05/08/13 116/82  12/30/12 126/70   just got off the treadmill - improved bp on 2nd check BP: 130/80 mmHg     Zoster status -has not had the vaccine -may be interested in it - will call ins about it  Pneumonia vaccine - due for  Flu vaccine-had today  colonosc 7/11- due in a year  Mammogram 12/14 nl  No lumps  She sees gyn   Td 2/12  dexa 11/12 - osteopenia  She takes her ca and vit D (2000 iu vit d daily) Regularly exercises  Also eats healthy    Hyperlipidemia On lipitor Lab Results  Component Value Date   CHOL 167 05/08/2013   HDL 54.40 05/08/2013   LDLCALC 85 05/08/2013   TRIG 138.0 05/08/2013   CHOLHDL 3 05/08/2013    gerd - taking protonix  Also a hx of gastroparesis - she has to watch her diet very carefully  She had severe nausea and wt loss in the past  Sees GI   Has been on estradiol for a long time - since about 31 when she had a hysterectomy  She is thinking of stopping it /weaning off   Worse allergy problems this season  Is thinking about trying zyrtec  She is using nasonex daily   Has had anxiety - most of her life and also panic disorder  She takes 1-2 pills per day  Runs in her family  Years ago - she was on something else when going through a tough time   She will be due for an annual exam in April  Patient Active Problem List   Diagnosis Date Noted  . Osteopenia 10/20/2013  . GASTROPARESIS 01/02/2008  . ALLERGIC RHINITIS 09/12/2007  . COLONIC POLYPS 03/16/2007  . GERD 03/16/2007  . HYPERLIPIDEMIA 02/21/2007  . ANXIETY 02/21/2007  . IBS 02/21/2007  . DEGENERATIVE JOINT  DISEASE 02/21/2007   Past Medical History  Diagnosis Date  . Allergic rhinitis   . Family history of ischemic heart disease   . Hyperlipidemia   . GERD (gastroesophageal reflux disease)   . Gastroparesis   . IBS (irritable bowel syndrome)   . Hx of colonic polyps   . DJD (degenerative joint disease)   . Anxiety   . Blood donor   . Hypercholesteremia   . Arthritis     in back  . Depression    Past Surgical History  Procedure Laterality Date  . Left breast biopsy      benign x 2  . Breast biopsy  1980, 1983    Benign Breast Biospy x 2  . Cyst excision  04/2011    Thigh  . Abdominal hysterectomy  1985    TAH/BSO Age 65 DUB, pelvic pain   History  Substance Use Topics  . Smoking status: Never Smoker   . Smokeless tobacco: Not on file  . Alcohol Use: Not on file   Family History  Problem Relation Age of Onset  . Leukemia Mother  Allergies  Allergen Reactions  . Aspirin     Unable to take a full aspirin--causes palpitations Tachycardia  . Crab [Shellfish Allergy]     Rash from head to toe   Current Outpatient Prescriptions on File Prior to Visit  Medication Sig Dispense Refill  . ALPRAZolam (XANAX) 0.5 MG tablet TAKE 1 TABLET BY MOUTH 3 TIMES A DAY AS NEEDED FOR ANIXETY  90 tablet  5  . atorvastatin (LIPITOR) 20 MG tablet TAKE 1 TABLET BY MOUTH DAILY  90 tablet  3  . atorvastatin (LIPITOR) 20 MG tablet TAKE 1 TABLET (20 MG TOTAL) BY MOUTH DAILY.  90 tablet  1  . calcium gluconate 500 MG tablet Take 500 mg by mouth daily.      [name] Sodium 30-100 MG CAPS Take 2 capsules by mouth daily.      . Cholecalciferol (VITAMIN D) 2000 UNITS CAPS Take 1 capsule by mouth daily.      Marland Kitchen estradiol (CLIMARA - DOSED IN MG/24 HR) 0.05 mg/24hr patch Place 1 patch (0.05 mg total) onto the skin once a week.  4 patch  12  . Fish Oil-Krill Oil CAPS Take 1 capsule by mouth daily.      . mometasone (NASONEX) 50 MCG/ACT nasal spray Place 2 sprays into the nose daily.  17 g   5  . Multiple Vitamins-Minerals (MULTIPLE VITAMINS/WOMENS PO) Take 1 tablet by mouth daily.      . pantoprazole (PROTONIX) 40 MG tablet TAKE 1 TABLET BY MOUTH DAILY  90 tablet  3  . promethazine (PHENERGAN) 25 MG tablet TAKE 1/2 TO 1 TABLET BY MOUTH EVERY 4 HOURS AS NEEDED FOR NAUSEA  30 tablet  5   No current facility-administered medications on file prior to visit.    Review of Systems Review of Systems  Constitutional: Negative for fever, appetite change, fatigue and unexpected weight change.  Eyes: Negative for pain and visual disturbance.  Respiratory: Negative for cough and shortness of breath.   Cardiovascular: Negative for cp or palpitations    Gastrointestinal: Negative for nausea, diarrhea and constipation.  Genitourinary: Negative for urgency and frequency.  Skin: Negative for pallor or rash   Neurological: Negative for weakness, light-headedness, numbness and headaches.  Hematological: Negative for adenopathy. Does not bruise/bleed easily.  Psychiatric/Behavioral: Negative for dysphoric mood. The patient is not nervous/anxious.         Objective:   Physical Exam  Constitutional: She appears well-developed and well-nourished. No distress.  HENT:  Head: Normocephalic and atraumatic.  Right Ear: External ear normal.  Left Ear: External ear normal.  Nose: Nose normal.  Mouth/Throat: Oropharynx is clear and moist.  Eyes: Conjunctivae and EOM are normal. Pupils are equal, round, and reactive to light. Right eye exhibits no discharge. Left eye exhibits no discharge. No scleral icterus.  Neck: Normal range of motion. Neck supple. No JVD present. Carotid bruit is not present. No thyromegaly present.  Cardiovascular: Normal rate, regular rhythm, normal heart sounds and intact distal pulses.  Exam reveals no gallop.   Pulmonary/Chest: Effort normal and breath sounds normal. No respiratory distress. She has no wheezes. She has no rales.  Abdominal: Soft. Bowel sounds are normal.  She exhibits no distension and no mass. There is no tenderness.  Musculoskeletal: She exhibits no edema and no tenderness.  Lymphadenopathy:    She has no cervical adenopathy.  Neurological: She is alert. She has normal reflexes. No cranial nerve deficit. She exhibits normal muscle tone. Coordination normal.  Skin: Skin  is warm and dry. No rash noted. No erythema. No pallor.  Psychiatric: She has a normal mood and affect.  Not anxious appearing today          Assessment & Plan:   Problem List Items Addressed This Visit     Digestive   GERD     Well controlled on protonix  Disc diet for GERd and gastroparesis       Musculoskeletal and Integument   Osteopenia     Last dexa 11/12 We will disc f/u for this - at annual exam or at her gyn f/u  Disc need for calcium/ vitamin D/ wt bearing exercise and bone density test every 2 y to monitor Disc safety/ fracture risk in detail   She is on HRT - but will disc wean from that at next gyn visit       Other   HYPERLIPIDEMIA     Rev last lipid profile and lipitor and diet  Good diet and exercise      ANXIETY - Primary     Pt takes 1 to 2 xanax per day -this controlls symptoms of gen anx and panic  Disc some stressors Reviewed stressors/ coping techniques/symptoms/ support sources/ tx options and side effects in detail today In the future- may want to consider counseling and transition to SSRI - since the xanax poses risk for falls     Other Visit Diagnoses   Need for prophylactic vaccination and inoculation against influenza        Relevant Orders       Flu Vaccine QUAD 36+ mos PF IM (Fluarix Quad PF) (Completed)    Need for 23-polyvalent pneumococcal polysaccharide vaccine        Relevant Orders       Pneumococcal polysaccharide vaccine 23-valent greater than or equal to 2yo subcutaneous/IM (Completed)

## 2013-10-20 NOTE — Patient Instructions (Signed)
Flu shot today  Pneumonia vaccine  Keep taking good care of yourself

## 2013-10-22 NOTE — Assessment & Plan Note (Addendum)
Last dexa 11/12 We will disc f/u for this - at annual exam or at her gyn f/u  Disc need for calcium/ vitamin D/ wt bearing exercise and bone density test every 2 y to monitor Disc safety/ fracture risk in detail   She is on HRT - but will disc wean from that at next gyn visit

## 2013-10-22 NOTE — Assessment & Plan Note (Signed)
Well controlled on protonix  Disc diet for GERd and gastroparesis

## 2013-10-22 NOTE — Assessment & Plan Note (Signed)
Pt takes 1 to 2 xanax per day -this controlls symptoms of gen anx and panic  Disc some stressors Reviewed stressors/ coping techniques/symptoms/ support sources/ tx options and side effects in detail today In the future- may want to consider counseling and transition to SSRI - since the xanax poses risk for falls

## 2013-10-22 NOTE — Assessment & Plan Note (Signed)
Rev last lipid profile and lipitor and diet  Good diet and exercise

## 2013-11-13 ENCOUNTER — Ambulatory Visit (INDEPENDENT_AMBULATORY_CARE_PROVIDER_SITE_OTHER): Payer: Medicare HMO | Admitting: Family Medicine

## 2013-11-13 ENCOUNTER — Encounter: Payer: Self-pay | Admitting: Family Medicine

## 2013-11-13 VITALS — BP 138/80 | HR 94 | Temp 98.0°F | Ht 66.0 in | Wt 146.5 lb

## 2013-11-13 DIAGNOSIS — M7918 Myalgia, other site: Secondary | ICD-10-CM

## 2013-11-13 DIAGNOSIS — R5383 Other fatigue: Secondary | ICD-10-CM

## 2013-11-13 DIAGNOSIS — R079 Chest pain, unspecified: Secondary | ICD-10-CM | POA: Insufficient documentation

## 2013-11-13 DIAGNOSIS — M791 Myalgia: Secondary | ICD-10-CM

## 2013-11-13 DIAGNOSIS — R42 Dizziness and giddiness: Secondary | ICD-10-CM

## 2013-11-13 LAB — CBC WITH DIFFERENTIAL/PLATELET
BASOS ABS: 0 10*3/uL (ref 0.0–0.1)
Basophils Relative: 0.4 % (ref 0.0–3.0)
EOS ABS: 0 10*3/uL (ref 0.0–0.7)
Eosinophils Relative: 0.4 % (ref 0.0–5.0)
HCT: 42.5 % (ref 36.0–46.0)
HEMOGLOBIN: 14.2 g/dL (ref 12.0–15.0)
LYMPHS ABS: 1.2 10*3/uL (ref 0.7–4.0)
Lymphocytes Relative: 15.4 % (ref 12.0–46.0)
MCHC: 33.5 g/dL (ref 30.0–36.0)
MCV: 90.7 fl (ref 78.0–100.0)
MONO ABS: 0.3 10*3/uL (ref 0.1–1.0)
MONOS PCT: 3.7 % (ref 3.0–12.0)
NEUTROS ABS: 6.4 10*3/uL (ref 1.4–7.7)
Neutrophils Relative %: 80.1 % — ABNORMAL HIGH (ref 43.0–77.0)
PLATELETS: 181 10*3/uL (ref 150.0–400.0)
RBC: 4.68 Mil/uL (ref 3.87–5.11)
RDW: 12.9 % (ref 11.5–15.5)
WBC: 8 10*3/uL (ref 4.0–10.5)

## 2013-11-13 LAB — COMPREHENSIVE METABOLIC PANEL
ALT: 19 U/L (ref 0–35)
AST: 23 U/L (ref 0–37)
Albumin: 4.7 g/dL (ref 3.5–5.2)
Alkaline Phosphatase: 52 U/L (ref 39–117)
BILIRUBIN TOTAL: 0.6 mg/dL (ref 0.2–1.2)
BUN: 10 mg/dL (ref 6–23)
CO2: 22 meq/L (ref 19–32)
CREATININE: 1 mg/dL (ref 0.4–1.2)
Calcium: 9.6 mg/dL (ref 8.4–10.5)
Chloride: 101 mEq/L (ref 96–112)
GFR: 62.4 mL/min (ref >60.00)
Glucose, Bld: 119 mg/dL — ABNORMAL HIGH (ref 70–99)
Potassium: 4.3 mEq/L (ref 3.5–5.1)
SODIUM: 137 meq/L (ref 135–145)
Total Protein: 7.9 g/dL (ref 6.0–8.3)

## 2013-11-13 LAB — TSH: TSH: 1.7 u[IU]/mL (ref 0.35–4.50)

## 2013-11-13 LAB — VITAMIN B12: Vitamin B-12: 579 pg/mL (ref 211–911)

## 2013-11-13 MED ORDER — CYCLOBENZAPRINE HCL 10 MG PO TABS: 5.0000 mg | ORAL_TABLET | Freq: Three times a day (TID) | ORAL | Status: DC | PRN

## 2013-11-13 NOTE — Progress Notes (Signed)
Subjective:    Patient ID: Tabitha Carlson, female    DOB: 02/20/1945, 68 y.o.   MRN: [id number]  HPI Not feeling well   Had an episode last wed Got a bit dizzy and nauseated  Did not occur when exercising  Was brief  ? What caused it  No headache    Now has arm pain - starts in her L forearm and moves up in to her shoulder  Not positional  Radiates to shoulder blade (she points to her trapezius as well)  Comes and goes  Does not worsen with treadmill use -does not seem to be exertional   Much more fatigued than usual about 2 weeks or so as well  Just feels out of whack  Mood is ok   Patient Active Problem List   Diagnosis Date Noted  . Dizzy spells 11/13/2013  . Left sided chest pain 11/13/2013  . Rhomboid muscle pain 11/13/2013  . Fatigue 11/13/2013  . Osteopenia 10/20/2013  . GASTROPARESIS 01/02/2008  . ALLERGIC RHINITIS 09/12/2007  . COLONIC POLYPS 03/16/2007  . GERD 03/16/2007  . HYPERLIPIDEMIA 02/21/2007  . ANXIETY 02/21/2007  . IBS 02/21/2007  . DEGENERATIVE JOINT DISEASE 02/21/2007   Past Medical History  Diagnosis Date  . Allergic rhinitis   . Family history of ischemic heart disease   . Hyperlipidemia   . GERD (gastroesophageal reflux disease)   . Gastroparesis   . IBS (irritable bowel syndrome)   . Hx of colonic polyps   . DJD (degenerative joint disease)   . Anxiety   . Blood donor   . Hypercholesteremia   . Arthritis     in back  . Depression    Past Surgical History  Procedure Laterality Date  . Left breast biopsy      benign x 2  . Breast biopsy  1980, 1983    Benign Breast Biospy x 2  . Cyst excision  04/2011    Thigh  . Abdominal hysterectomy  1985    TAH/BSO Age 79 DUB, pelvic pain   History  Substance Use Topics  . Smoking status: Never Smoker   . Smokeless tobacco: Not on file  . Alcohol Use: Yes     Comment: occ   Family History  Problem Relation Age of Onset  . Leukemia Mother    Allergies  Allergen Reactions  .  Aspirin     Unable to take a full aspirin--causes palpitations Tachycardia  . Crab [Shellfish Allergy]     Rash from head to toe   Current Outpatient Prescriptions on File Prior to Visit  Medication Sig Dispense Refill  . ALPRAZolam (XANAX) 0.5 MG tablet TAKE 1 TABLET BY MOUTH 3 TIMES A DAY AS NEEDED FOR ANIXETY  90 tablet  5  . atorvastatin (LIPITOR) 20 MG tablet TAKE 1 TABLET BY MOUTH DAILY  90 tablet  3  . atorvastatin (LIPITOR) 20 MG tablet TAKE 1 TABLET (20 MG TOTAL) BY MOUTH DAILY.  90 tablet  1  . calcium gluconate 500 MG tablet Take 500 mg by mouth daily.      [name] Sodium 30-100 MG CAPS Take 2 capsules by mouth daily.      . Cholecalciferol (VITAMIN D) 2000 UNITS CAPS Take 1 capsule by mouth daily.      Marland Kitchen estradiol (CLIMARA - DOSED IN MG/24 HR) 0.05 mg/24hr patch Place 1 patch (0.05 mg total) onto the skin once a week.  4 patch  12  . Fish Oil-Krill Oil  CAPS Take 1 capsule by mouth daily.      . mometasone (NASONEX) 50 MCG/ACT nasal spray Place 2 sprays into the nose daily.  17 g  5  . Multiple Vitamins-Minerals (MULTIPLE VITAMINS/WOMENS PO) Take 1 tablet by mouth daily.      . pantoprazole (PROTONIX) 40 MG tablet TAKE 1 TABLET BY MOUTH DAILY  90 tablet  3  . promethazine (PHENERGAN) 25 MG tablet TAKE 1/2 TO 1 TABLET BY MOUTH EVERY 4 HOURS AS NEEDED FOR NAUSEA  30 tablet  5   No current facility-administered medications on file prior to visit.    Review of Systems Review of Systems  Constitutional: Negative for fever, appetite change,  and unexpected weight change.  Eyes: Negative for pain and visual disturbance.  ENt neg for congestion or sinus pain  Respiratory: Negative for cough and shortness of breath.   Cardiovascular: Negative for  palpitations , pnd/ orthopnea or pedal edema    Gastrointestinal: Negative for nausea, diarrhea and constipation.  Genitourinary: Negative for urgency and frequency.  Skin: Negative for pallor or rash   MSK pos for L back  adn arm pain  Neurological: Negative for weakness, numbness and headaches.  Hematological: Negative for adenopathy. Does not bruise/bleed easily.  Psychiatric/Behavioral: Negative for dysphoric mood. The patient is not nervous/anxious.         Objective:   Physical Exam  Constitutional: She appears well-developed and well-nourished. No distress.  HENT:  Head: Normocephalic and atraumatic.  Right Ear: External ear normal.  Left Ear: External ear normal.  Mouth/Throat: Oropharynx is clear and moist.  Eyes: Conjunctivae and EOM are normal. Pupils are equal, round, and reactive to light. Right eye exhibits no discharge. Left eye exhibits no discharge. No scleral icterus.  No nystagmus   Neck: Normal range of motion. Neck supple.  Cardiovascular: Normal rate, regular rhythm, normal heart sounds and intact distal pulses.  Exam reveals no gallop and no friction rub.   No murmur heard. Pulmonary/Chest: Effort normal and breath sounds normal. No respiratory distress. She has no wheezes. She has no rales. She exhibits tenderness.  No crackles   Abdominal: Soft. Bowel sounds are normal. She exhibits no distension and no mass. There is no tenderness. There is no rebound and no guarding.  Musculoskeletal: She exhibits tenderness. She exhibits no edema.  Tenderness medial to L scapula in rhomboid area  No spinal tenderness  reprod of symptoms with rhomboid stretch  Lymphadenopathy:    She has no cervical adenopathy.  Neurological: She is alert. She has normal reflexes. No cranial nerve deficit. She exhibits normal muscle tone. Coordination normal.  Skin: Skin is warm and dry. No rash noted. No erythema. No pallor.  Psychiatric: She has a normal mood and affect.          Assessment & Plan:   Problem List Items Addressed This Visit     Other   Dizzy spells     ? Etiology  Nl exam today Lab for this and fatigue  F/u planned to eval further     Left sided chest pain     Atypical    Reassuring EKG Poss ref from TS or may have some costochondritis Adv to use heat and update/seek care if worse     Relevant Orders      EKG 12-Lead (Completed)   Rhomboid muscle pain - Primary     Disc massage/ stretching (taught)/ heat  Nsaid/flexeril prn with caution Re eval at f/u  Fatigue     Lab today  Disc sleep habits and stressors  F/u next week for rev and further eval     Relevant Orders      CBC with Differential (Completed)      Comprehensive metabolic panel (Completed)      TSH (Completed)      Vitamin B12 (Completed)

## 2013-11-13 NOTE — Assessment & Plan Note (Signed)
Disc massage/ stretching (taught)/ heat  Nsaid/flexeril prn with caution Re eval at f/u

## 2013-11-13 NOTE — Assessment & Plan Note (Signed)
?   Etiology  Nl exam today Lab for this and fatigue  F/u planned to eval further

## 2013-11-13 NOTE — Assessment & Plan Note (Signed)
Atypical  Reassuring EKG Poss ref from TS or may have some costochondritis Adv to use heat and update/seek care if worse

## 2013-11-13 NOTE — Assessment & Plan Note (Signed)
Lab today  Disc sleep habits and stressors  F/u next week for rev and further eval

## 2013-11-13 NOTE — Patient Instructions (Signed)
EKG today  Use the rhomboid stretch we discussed Also heat  Also massage Try the muscle relaxer when needed  Lab today for fatigue  Follow up with me in the next week or two

## 2013-11-13 NOTE — Progress Notes (Signed)
Pre visit review using our clinic review tool, if applicable. No additional management support is needed unless otherwise documented below in the visit note. 

## 2013-11-22 ENCOUNTER — Ambulatory Visit (INDEPENDENT_AMBULATORY_CARE_PROVIDER_SITE_OTHER): Payer: Medicare HMO | Admitting: Family Medicine

## 2013-11-22 ENCOUNTER — Ambulatory Visit (INDEPENDENT_AMBULATORY_CARE_PROVIDER_SITE_OTHER)
Admission: RE | Admit: 2013-11-22 | Discharge: 2013-11-22 | Disposition: A | Discharge status: Home / Self Care | Payer: Medicare HMO | Source: Ambulatory Visit | Attending: Family Medicine | Admitting: Family Medicine

## 2013-11-22 ENCOUNTER — Encounter: Payer: Self-pay | Admitting: Family Medicine

## 2013-11-22 VITALS — BP 130/80 | HR 93 | Temp 97.9°F | Ht 66.0 in | Wt 145.2 lb

## 2013-11-22 DIAGNOSIS — M546 Pain in thoracic spine: Secondary | ICD-10-CM

## 2013-11-22 DIAGNOSIS — M791 Myalgia: Secondary | ICD-10-CM

## 2013-11-22 DIAGNOSIS — R5383 Other fatigue: Secondary | ICD-10-CM

## 2013-11-22 DIAGNOSIS — M7918 Myalgia, other site: Secondary | ICD-10-CM

## 2013-11-22 NOTE — Patient Instructions (Signed)
Xray now  Stretch/ use heat if helpful  Watch carefully for a rash  We will make a plan when xray result returns - late today or tomorrow am

## 2013-11-22 NOTE — Assessment & Plan Note (Signed)
Labs reviewed- reassuring

## 2013-11-22 NOTE — Assessment & Plan Note (Signed)
This persists with tenderness  Continues to stretch/ nsaid/flexeril and heat  Xray today

## 2013-11-22 NOTE — Progress Notes (Signed)
Subjective:    Patient ID: Tabitha Carlson, female    DOB: 29-Jul-1945, 68 y.o.   MRN: [id number]  HPI Here for f/u of fatigue and rhomboid muscle pain   Now having tingling in her neck and in her fingers   tx with flexeril/ nsaid/stretching  Still quite painful  Also down arm into her lower arm  Under arm area is painful also  Hand is tingling but not painful    Labs: Office Visit on 11/13/2013  Component Date Value Ref Range Status  . WBC 11/13/2013 8.0  4.0 - 10.5 K/uL Final  . RBC 11/13/2013 4.68  3.87 - 5.11 Mil/uL Final  . Hemoglobin 11/13/2013 14.2  12.0 - 15.0 g/dL Final  . HCT 11/13/2013 42.5  36.0 - 46.0 % Final  . MCV 11/13/2013 90.7  78.0 - 100.0 fl Final  . MCHC 11/13/2013 33.5  30.0 - 36.0 g/dL Final  . RDW 11/13/2013 12.9  11.5 - 15.5 % Final  . Platelets 11/13/2013 181.0  150.0 - 400.0 K/uL Final  . Neutrophils Relative % 11/13/2013 80.1* 43.0 - 77.0 % Final  . Lymphocytes Relative 11/13/2013 15.4  12.0 - 46.0 % Final  . Monocytes Relative 11/13/2013 3.7  3.0 - 12.0 % Final  . Eosinophils Relative 11/13/2013 0.4  0.0 - 5.0 % Final  . Basophils Relative 11/13/2013 0.4  0.0 - 3.0 % Final  . Neutro Abs 11/13/2013 6.4  1.4 - 7.7 K/uL Final  . Lymphs Abs 11/13/2013 1.2  0.7 - 4.0 K/uL Final  . Monocytes Absolute 11/13/2013 0.3  0.1 - 1.0 K/uL Final  . Eosinophils Absolute 11/13/2013 0.0  0.0 - 0.7 K/uL Final  . Basophils Absolute 11/13/2013 0.0  0.0 - 0.1 K/uL Final  . Sodium 11/13/2013 137  135 - 145 mEq/L Final  . Potassium 11/13/2013 4.3  3.5 - 5.1 mEq/L Final  . Chloride 11/13/2013 101  96 - 112 mEq/L Final  . CO2 11/13/2013 22  19 - 32 mEq/L Final  . Glucose, Bld 11/13/2013 119* 70 - 99 mg/dL Final  . BUN 11/13/2013 10  6 - 23 mg/dL Final  . Creatinine, Ser 11/13/2013 1.0  0.4 - 1.2 mg/dL Final  . Total Bilirubin 11/13/2013 0.6  0.2 - 1.2 mg/dL Final  . Alkaline Phosphatase 11/13/2013 52  39 - 117 U/L Final  . AST 11/13/2013 23  0 - 37 U/L Final  . ALT  11/13/2013 19  0 - 35 U/L Final  . Total Protein 11/13/2013 7.9  6.0 - 8.3 g/dL Final  . Albumin 11/13/2013 4.7  3.5 - 5.2 g/dL Final  . Calcium 11/13/2013 9.6  8.4 - 10.5 mg/dL Final  . GFR 11/13/2013 62.40  >60.00 mL/min Final  . TSH 11/13/2013 1.70  0.35 - 4.50 uIU/mL Final  . Vitamin B-12 11/13/2013 579  211 - 911 pg/mL Final    ? Where her fatigue was coming from  Going to bed early - but wakes up with arm pain   Does not snore or stop breathing at night as a rule   Pain is 5/10 (was a 7)   Patient Active Problem List   Diagnosis Date Noted  . Thoracic back pain 11/22/2013  . Dizzy spells 11/13/2013  . Left sided chest pain 11/13/2013  . Rhomboid muscle pain 11/13/2013  . Fatigue 11/13/2013  . Osteopenia 10/20/2013  . GASTROPARESIS 01/02/2008  . ALLERGIC RHINITIS 09/12/2007  . COLONIC POLYPS 03/16/2007  . GERD 03/16/2007  . HYPERLIPIDEMIA 02/21/2007  .  ANXIETY 02/21/2007  . IBS 02/21/2007  . DEGENERATIVE JOINT DISEASE 02/21/2007   Past Medical History  Diagnosis Date  . Allergic rhinitis   . Family history of ischemic heart disease   . Hyperlipidemia   . GERD (gastroesophageal reflux disease)   . Gastroparesis   . IBS (irritable bowel syndrome)   . Hx of colonic polyps   . DJD (degenerative joint disease)   . Anxiety   . Blood donor   . Hypercholesteremia   . Arthritis     in back  . Depression    Past Surgical History  Procedure Laterality Date  . Left breast biopsy      benign x 2  . Breast biopsy  1980, 1983    Benign Breast Biospy x 2  . Cyst excision  04/2011    Thigh  . Abdominal hysterectomy  1985    TAH/BSO Age 32 DUB, pelvic pain   History  Substance Use Topics  . Smoking status: Never Smoker   . Smokeless tobacco: Not on file  . Alcohol Use: Yes     Comment: occ   Family History  Problem Relation Age of Onset  . Leukemia Mother    Allergies  Allergen Reactions  . Aspirin     Unable to take a full aspirin--causes  palpitations Tachycardia  . Crab [Shellfish Allergy]     Rash from head to toe   Current Outpatient Prescriptions on File Prior to Visit  Medication Sig Dispense Refill  . ALPRAZolam (XANAX) 0.5 MG tablet TAKE 1 TABLET BY MOUTH 3 TIMES A DAY AS NEEDED FOR ANIXETY  90 tablet  5  . atorvastatin (LIPITOR) 20 MG tablet TAKE 1 TABLET BY MOUTH DAILY  90 tablet  3  . atorvastatin (LIPITOR) 20 MG tablet TAKE 1 TABLET (20 MG TOTAL) BY MOUTH DAILY.  90 tablet  1  . calcium gluconate 500 MG tablet Take 500 mg by mouth daily.      [name] Sodium 30-100 MG CAPS Take 2 capsules by mouth daily.      . Cholecalciferol (VITAMIN D) 2000 UNITS CAPS Take 1 capsule by mouth daily.      . cyclobenzaprine (FLEXERIL) 10 MG tablet Take 0.5-1 tablets (5-10 mg total) by mouth 3 (three) times daily as needed for muscle spasms (watch out for sedation).  30 tablet  0  . estradiol (CLIMARA - DOSED IN MG/24 HR) 0.05 mg/24hr patch Place 1 patch (0.05 mg total) onto the skin once a week.  4 patch  12  . Fish Oil-Krill Oil CAPS Take 1 capsule by mouth daily.      . mometasone (NASONEX) 50 MCG/ACT nasal spray Place 2 sprays into the nose daily.  17 g  5  . Multiple Vitamins-Minerals (MULTIPLE VITAMINS/WOMENS PO) Take 1 tablet by mouth daily.      . pantoprazole (PROTONIX) 40 MG tablet TAKE 1 TABLET BY MOUTH DAILY  90 tablet  3  . promethazine (PHENERGAN) 25 MG tablet TAKE 1/2 TO 1 TABLET BY MOUTH EVERY 4 HOURS AS NEEDED FOR NAUSEA  30 tablet  5   No current facility-administered medications on file prior to visit.    Review of Systems Review of Systems  Constitutional: Negative for fever, appetite change,  and unexpected weight change.  Eyes: Negative for pain and visual disturbance.  Respiratory: Negative for cough and shortness of breath.   Cardiovascular: Negative for cp or palpitations    Gastrointestinal: Negative for nausea, diarrhea and constipation.  Genitourinary:  Negative for urgency and  frequency.  MSK pos for L sided mid back pain  Skin: Negative for pallor or rash   Neurological: Negative for weakness, light-headedness, and headaches. pos for pain /tingling in L arm and hand  Hematological: Negative for adenopathy. Does not bruise/bleed easily.  Psychiatric/Behavioral: Negative for dysphoric mood. The patient is not nervous/anxious.         Objective:   Physical Exam  Constitutional: She appears well-developed and well-nourished. No distress.  HENT:  Head: Normocephalic and atraumatic.  Mouth/Throat: Oropharynx is clear and moist.  Eyes: Conjunctivae and EOM are normal. Pupils are equal, round, and reactive to light. No scleral icterus.  Neck: Normal range of motion. Neck supple. No JVD present. No thyromegaly present.  Cardiovascular: Normal rate and regular rhythm.   Pulmonary/Chest: Effort normal and breath sounds normal. No respiratory distress. She has no wheezes. She has no rales.  Musculoskeletal: She exhibits tenderness. She exhibits no edema.  L sided thoracic pain - medial to scapula /tender with spasm  Also tender upper TS over spinous processess No tenderness in arm  Lymphadenopathy:    She has no cervical adenopathy.  Neurological: She is alert. She has normal reflexes. She displays no atrophy. No cranial nerve deficit or sensory deficit. She exhibits normal muscle tone. Coordination and gait normal.  No focal deficits   Skin: Skin is warm and dry. No rash noted. No erythema.  Psychiatric: She has a normal mood and affect.          Assessment & Plan:   Problem List Items Addressed This Visit     Other   Rhomboid muscle pain     This persists with tenderness  Continues to stretch/ nsaid/flexeril and heat  Xray today    Fatigue     Labs reviewed- reassuring     Thoracic back pain - Primary     Now rad to L arm No skin changes Nl exam except for tenderness med to L scapula  Xray today     Relevant Orders      DG Thoracic Spine  W/Swimmers (Completed)

## 2013-11-22 NOTE — Progress Notes (Signed)
Pre visit review using our clinic review tool, if applicable. No additional management support is needed unless otherwise documented below in the visit note. 

## 2013-11-22 NOTE — Assessment & Plan Note (Signed)
Now rad to L arm No skin changes Nl exam except for tenderness med to L scapula  Xray today

## 2013-11-23 ENCOUNTER — Other Ambulatory Visit: Payer: Self-pay | Admitting: Pulmonary Disease

## 2013-11-23 ENCOUNTER — Telehealth: Payer: Self-pay

## 2013-11-23 DIAGNOSIS — M546 Pain in thoracic spine: Secondary | ICD-10-CM

## 2013-11-23 NOTE — Telephone Encounter (Signed)
Pt left v/m; pt received xray results and pt is agreeable to see orthopedic; pt will be out of town  11/27/13 -12/01/13 and request referral after next week.

## 2013-11-23 NOTE — Telephone Encounter (Signed)
Ref done  

## 2013-11-23 NOTE — Telephone Encounter (Signed)
Ref to ortho

## 2013-11-24 ENCOUNTER — Other Ambulatory Visit: Payer: Self-pay | Admitting: Family Medicine

## 2013-11-24 NOTE — Telephone Encounter (Signed)
See message from pharmacy on Rx request, please advise

## 2013-11-24 NOTE — Telephone Encounter (Signed)
If nasonex is not available I recommend flonase  Px written for call in  - if this is what she wants  I cannot seem to remove nasonex from the cue to do that, however

## 2013-11-28 MED ORDER — FLUTICASONE PROPIONATE 50 MCG/ACT NA SUSP: 2.0000 | Freq: Every day | NASAL | Status: DC

## 2013-12-06 ENCOUNTER — Other Ambulatory Visit: Payer: Self-pay | Admitting: Pulmonary Disease

## 2013-12-07 ENCOUNTER — Other Ambulatory Visit: Payer: Self-pay | Admitting: Family Medicine

## 2013-12-07 NOTE — Telephone Encounter (Signed)
Px written for call in   

## 2013-12-07 NOTE — Telephone Encounter (Signed)
Rx called in as prescribed 

## 2013-12-07 NOTE — Telephone Encounter (Signed)
Electronic refill request, please advise  

## 2013-12-11 ENCOUNTER — Encounter: Payer: Self-pay | Admitting: Family Medicine

## 2013-12-29 ENCOUNTER — Telehealth: Payer: Self-pay | Admitting: Gynecology

## 2013-12-29 NOTE — Telephone Encounter (Signed)
Left message upcoming appointment has been canceled.

## 2014-01-08 ENCOUNTER — Ambulatory Visit: Payer: Medicare Other | Admitting: Gynecology

## 2014-01-10 ENCOUNTER — Other Ambulatory Visit: Payer: Self-pay | Admitting: *Deleted

## 2014-01-10 ENCOUNTER — Other Ambulatory Visit: Payer: Self-pay | Admitting: Orthopaedic Surgery

## 2014-01-10 DIAGNOSIS — M542 Cervicalgia: Secondary | ICD-10-CM

## 2014-01-10 DIAGNOSIS — N951 Menopausal and female climacteric states: Secondary | ICD-10-CM

## 2014-01-10 MED ORDER — ESTRADIOL 0.05 MG/24HR TD PTWK: 0.0500 mg | MEDICATED_PATCH | TRANSDERMAL | Status: DC

## 2014-01-10 NOTE — Telephone Encounter (Signed)
Fax From: [facility] for Estradiol 0.05 mg patch Last Refilled/AEX: 12/30/12 #4 patches with 12 refills by Dr. Charlies Constable Aex Scheduled: 02/28/14 with Ms. Debbie Last Mammogram: 01/18/13 Bi-Rads 1: Negative   Okay to send in until AEX, please advise.

## 2014-01-19 ENCOUNTER — Ambulatory Visit
Admission: RE | Admit: 2014-01-19 | Discharge: 2014-01-19 | Disposition: A | Discharge status: Home / Self Care | Payer: Medicare HMO | Source: Ambulatory Visit | Attending: Orthopaedic Surgery | Admitting: Orthopaedic Surgery

## 2014-01-19 DIAGNOSIS — M542 Cervicalgia: Secondary | ICD-10-CM

## 2014-02-28 ENCOUNTER — Ambulatory Visit (INDEPENDENT_AMBULATORY_CARE_PROVIDER_SITE_OTHER): Payer: Medicare HMO | Admitting: Certified Nurse Midwife

## 2014-02-28 ENCOUNTER — Encounter: Payer: Self-pay | Admitting: Certified Nurse Midwife

## 2014-02-28 VITALS — BP 140/70 | HR 80 | Resp 16 | Ht 65.5 in | Wt 147.0 lb

## 2014-02-28 DIAGNOSIS — Z01419 Encounter for gynecological examination (general) (routine) without abnormal findings: Secondary | ICD-10-CM

## 2014-02-28 NOTE — Progress Notes (Signed)
69 y.o. [id number] Married  Caucasian Fe here for annual exam. Menopausal no vaginal bleeding. Vaginal dryness using OTC medication. Patient weaning off Climara with 1/2 patch and stopping soon. Occasional hot flash, no issues. Sees PCP yearly for medication management for cholesterol, anxiety and asthma/allergy, and aex/labs.  Having evaluation for back problems, under physical therapy. No other health issues today.  No LMP recorded. Patient has had a hysterectomy.          Sexually active: Yes.    The current method of family planning is status post hysterectomy.    Exercising: Yes.    Active  Smoker:  no  Health Maintenance: Pap: 05/2001 WNL MMG:  01/18/13 BIRADS1:neg will schedule Colonoscopy: 08/2009. Mild diverticulosis, polyps. Repeat every 5 years  Sees Dr. Fuller Plan BMD:  12/2010 TDaP: 2012 Labs: PCP   reports that she has never smoked. She has never used smokeless tobacco. She reports that she does not drink alcohol or use illicit drugs.  Past Medical History  Diagnosis Date  . Allergic rhinitis   . Family history of ischemic heart disease   . Hyperlipidemia   . GERD (gastroesophageal reflux disease)   . Gastroparesis   . IBS (irritable bowel syndrome)   . Hx of colonic polyps   . DJD (degenerative joint disease)   . Anxiety   . Blood donor   . Hypercholesteremia   . Arthritis     in back. Physical Therapy 2016  . Depression     Past Surgical History  Procedure Laterality Date  . Left breast biopsy      benign x 2  . Breast biopsy  1980, 1983    Benign Breast Biospy x 2  . Cyst excision  04/2011    Thigh  . Abdominal hysterectomy  1985    TAH/BSO Age 74 DUB, pelvic pain    Current Outpatient Prescriptions  Medication Sig Dispense Refill  . acetaminophen (TYLENOL) 325 MG tablet Take 650 mg by mouth every 6 (six) hours as needed.    . ALPRAZolam (XANAX) 0.5 MG tablet TAKE 1 TABLET BY MOUTH 3 TIMES A DAY AS NEEDED FOR ANXIETY 90 tablet 3  . atorvastatin (LIPITOR) 20  MG tablet TAKE 1 TABLET (20 MG TOTAL) BY MOUTH DAILY. 90 tablet 1  . calcium gluconate 500 MG tablet Take 500 mg by mouth daily.    [name] Sodium 30-100 MG CAPS Take 2 capsules by mouth daily.    . Cholecalciferol (VITAMIN D) 2000 UNITS CAPS Take 1 capsule by mouth daily.    Marland Kitchen estradiol (CLIMARA - DOSED IN MG/24 HR) 0.05 mg/24hr patch Place 1 patch (0.05 mg total) onto the skin once a week. 4 patch 0  . Fish Oil-Krill Oil CAPS Take 1 capsule by mouth daily.    . fluticasone (FLONASE) 50 MCG/ACT nasal spray Place 2 sprays into both nostrils daily. 16 g 5  . Multiple Vitamins-Minerals (MULTIPLE VITAMINS/WOMENS PO) Take 1 tablet by mouth daily.    . pantoprazole (PROTONIX) 40 MG tablet TAKE 1 TABLET BY MOUTH DAILY 90 tablet 3  . promethazine (PHENERGAN) 25 MG tablet TAKE 1/2 TO 1 TABLET BY MOUTH EVERY 4 HOURS AS NEEDED FOR NAUSEA 30 tablet 5   No current facility-administered medications for this visit.    Family History  Problem Relation Age of Onset  . Leukemia Mother     ROS:  Pertinent items are noted in HPI.  Otherwise, a comprehensive ROS was negative.  Exam:   BP 140/70 mmHg  Pulse 80  Resp 16  Ht 5' 5.5" (1.664 m)  Wt 147 lb (66.679 kg)  BMI 24.08 kg/m2 Height: 5' 5.5" (166.4 cm) Ht Readings from Last 3 Encounters:  02/28/14 5' 5.5" (1.664 m)  11/22/13 5\' 6"  (1.676 m)  11/13/13 5\' 6"  (1.676 m)    General appearance: alert, cooperative and appears stated age Head: Normocephalic, without obvious abnormality, atraumatic Neck: no adenopathy, supple, symmetrical, trachea midline and thyroid normal to inspection and palpation Lungs: clear to auscultation bilaterally Breasts: normal appearance, no masses or tenderness, No nipple retraction or dimpling, No nipple discharge or bleeding, No axillary or supraclavicular adenopathy Heart: regular rate and rhythm Abdomen: soft, non-tender; no masses,  no organomegaly Extremities: extremities normal, atraumatic, no  cyanosis or edema Skin: Skin color, texture, turgor normal. No rashes or lesions Lymph nodes: Cervical, supraclavicular, and axillary nodes normal. No abnormal inguinal nodes palpated Neurologic: Grossly normal   Pelvic: External genitalia:  no lesions              Urethra:  normal appearing urethra with no masses, tenderness or lesions              Bartholin's and Skene's: normal                 Vagina: normal appearing vagina with normal color and discharge, no lesions              Cervix: absent              Pap taken: No. Bimanual Exam:  Uterus:  uterus absent              Adnexa: no mass, fullness, tenderness and adnexal absent bilateral               Rectovaginal: Confirms               Anus:  normal sphincter tone, no lesions    A:  Well Woman with normal exam  Menopausal on ERT weaning off medication now  Allergy/anxiety/cholesterol management by PCP, all stable per patient.  Back and neck problems under evaluation and in physical therapy  P:   Reviewed health and wellness pertinent to exam  Will not refill Rx as patient is almost completed with Climara now. Feels better going off of it now.  Continue follow up as indcated  Pap smear taken not today   counseled on breast self exam, mammography screening, adequate intake of calcium and vitamin D, diet and exercise  return annually or prn  An After Visit Summary was printed and given to the patient.

## 2014-02-28 NOTE — Patient Instructions (Signed)

## 2014-03-04 NOTE — Progress Notes (Signed)
Reviewed personally.  M. Suzanne Jeven Topper, MD.  

## 2014-05-07 ENCOUNTER — Telehealth: Payer: Self-pay | Admitting: Family Medicine

## 2014-05-07 DIAGNOSIS — K219 Gastro-esophageal reflux disease without esophagitis: Secondary | ICD-10-CM

## 2014-05-07 DIAGNOSIS — M8580 Other specified disorders of bone density and structure, unspecified site: Secondary | ICD-10-CM

## 2014-05-07 DIAGNOSIS — E785 Hyperlipidemia, unspecified: Secondary | ICD-10-CM

## 2014-05-07 DIAGNOSIS — F411 Generalized anxiety disorder: Secondary | ICD-10-CM

## 2014-05-07 NOTE — Telephone Encounter (Signed)
-----   Message from Marchia Bond sent at 05/07/2014  3:57 PM EDT ----- Regarding: Cpx labs Fri 05/11/14 Please order  future cpx labs for pt's upcoming lab appt. Thanks Aniceto Boss

## 2014-05-11 ENCOUNTER — Other Ambulatory Visit (INDEPENDENT_AMBULATORY_CARE_PROVIDER_SITE_OTHER): Payer: Medicare HMO

## 2014-05-11 DIAGNOSIS — E785 Hyperlipidemia, unspecified: Secondary | ICD-10-CM

## 2014-05-11 DIAGNOSIS — M8580 Other specified disorders of bone density and structure, unspecified site: Secondary | ICD-10-CM

## 2014-05-11 DIAGNOSIS — M859 Disorder of bone density and structure, unspecified: Secondary | ICD-10-CM

## 2014-05-11 DIAGNOSIS — F411 Generalized anxiety disorder: Secondary | ICD-10-CM | POA: Diagnosis not present

## 2014-05-11 DIAGNOSIS — K219 Gastro-esophageal reflux disease without esophagitis: Secondary | ICD-10-CM | POA: Diagnosis not present

## 2014-05-11 LAB — CBC WITH DIFFERENTIAL/PLATELET
BASOS ABS: 0 10*3/uL (ref 0.0–0.1)
Basophils Relative: 0.7 % (ref 0.0–3.0)
EOS ABS: 0.2 10*3/uL (ref 0.0–0.7)
Eosinophils Relative: 5.7 % — ABNORMAL HIGH (ref 0.0–5.0)
HCT: 36.9 % (ref 36.0–46.0)
Hemoglobin: 12.8 g/dL (ref 12.0–15.0)
LYMPHS PCT: 47.1 % — AB (ref 12.0–46.0)
Lymphs Abs: 2 10*3/uL (ref 0.7–4.0)
MCHC: 34.5 g/dL (ref 30.0–36.0)
MCV: 88.9 fl (ref 78.0–100.0)
Monocytes Absolute: 0.4 10*3/uL (ref 0.1–1.0)
Monocytes Relative: 8.6 % (ref 3.0–12.0)
NEUTROS ABS: 1.6 10*3/uL (ref 1.4–7.7)
NEUTROS PCT: 37.9 % — AB (ref 43.0–77.0)
Platelets: 212 10*3/uL (ref 150.0–400.0)
RBC: 4.15 Mil/uL (ref 3.87–5.11)
RDW: 12.4 % (ref 11.5–15.5)
WBC: 4.2 10*3/uL (ref 4.0–10.5)

## 2014-05-11 LAB — COMPREHENSIVE METABOLIC PANEL
ALBUMIN: 4.1 g/dL (ref 3.5–5.2)
ALK PHOS: 58 U/L (ref 39–117)
ALT: 24 U/L (ref 0–35)
AST: 24 U/L (ref 0–37)
BUN: 17 mg/dL (ref 6–23)
CALCIUM: 10.1 mg/dL (ref 8.4–10.5)
CO2: 32 mEq/L (ref 19–32)
Chloride: 102 mEq/L (ref 96–112)
Creatinine, Ser: 0.92 mg/dL (ref 0.40–1.20)
GFR: 64.66 mL/min (ref >60.00)
Glucose, Bld: 98 mg/dL (ref 70–99)
Potassium: 4.6 mEq/L (ref 3.5–5.1)
SODIUM: 138 meq/L (ref 135–145)
Total Bilirubin: 0.5 mg/dL (ref 0.2–1.2)
Total Protein: 7.3 g/dL (ref 6.0–8.3)

## 2014-05-11 LAB — VITAMIN D 25 HYDROXY (VIT D DEFICIENCY, FRACTURES): VITD: 49.33 ng/mL (ref 30.00–100.00)

## 2014-05-11 LAB — LIPID PANEL
CHOL/HDL RATIO: 3
Cholesterol: 167 mg/dL (ref 0–200)
HDL: 51.6 mg/dL (ref >39.00)
LDL CALC: 96 mg/dL (ref 0–99)
NONHDL: 115.4
Triglycerides: 99 mg/dL (ref 0.0–149.0)
VLDL: 19.8 mg/dL (ref 0.0–40.0)

## 2014-05-11 LAB — TSH: TSH: 3.35 u[IU]/mL (ref 0.35–4.50)

## 2014-05-18 ENCOUNTER — Ambulatory Visit (INDEPENDENT_AMBULATORY_CARE_PROVIDER_SITE_OTHER): Payer: Medicare HMO | Admitting: Family Medicine

## 2014-05-18 ENCOUNTER — Encounter: Payer: Self-pay | Admitting: Family Medicine

## 2014-05-18 VITALS — BP 120/68 | HR 103 | Temp 98.5°F | Ht 65.5 in | Wt 148.8 lb

## 2014-05-18 DIAGNOSIS — Z0000 Encounter for general adult medical examination without abnormal findings: Secondary | ICD-10-CM | POA: Diagnosis not present

## 2014-05-18 DIAGNOSIS — E785 Hyperlipidemia, unspecified: Secondary | ICD-10-CM

## 2014-05-18 DIAGNOSIS — M8580 Other specified disorders of bone density and structure, unspecified site: Secondary | ICD-10-CM

## 2014-05-18 MED ORDER — ATORVASTATIN CALCIUM 20 MG PO TABS: ORAL_TABLET | ORAL | Status: DC

## 2014-05-18 MED ORDER — PANTOPRAZOLE SODIUM 40 MG PO TBEC: DELAYED_RELEASE_TABLET | ORAL | Status: DC

## 2014-05-18 NOTE — Progress Notes (Signed)
Pre visit review using our clinic review tool, if applicable. No additional management support is needed unless otherwise documented below in the visit note. 

## 2014-05-18 NOTE — Progress Notes (Signed)
Subjective:    Patient ID: Tabitha Carlson, female    DOB: 16-May-1945, 69 y.o.   MRN: [id number]  HPI Here for annual medicare wellness visit as well as chronic/acute medical problems    I have personally reviewed the Medicare Annual Wellness questionnaire and have noted 1. The patient's medical and social history 2. Their use of alcohol, tobacco or illicit drugs 3. Their current medications and supplements 4. The patient's functional ability including ADL's, fall risks, home safety risks and hearing or visual             impairment. 5. Diet and physical activities 6. Evidence for depression or mood disorders  The patients weight, height, BMI have been recorded in the chart and visual acuity is per eye clinic.  I have made referrals, counseling and provided education to the patient based review of the above and I have provided the pt with a written personalized care plan for preventive services.  Has a herniated disc- in PT since January -that has helped a whole lot  ? If she will see surg- has f/u in May - may have injection if she needs one   See scanned forms.  Routine anticipatory guidance given to patient.  See health maintenance. Colon cancer screening 7/11 with 5 year recall - due this summer  Breast cancer screening 12/14 - is due for one - goes to the breast center - she will set it up for summer  There is breast cancer in her family  Saw gyn in Jan (she is off hormones now)- doing ok  Self breast exam- no lumps  Flu vaccine 9/15  Tetanus vaccine 2/12  Pneumovax 9/15 - not long enough to the prevnar yet  Zoster vaccine - needs to check on coverage  Osteopenia 11/12 - D level 49  - she wants to put off the dexa for now  No fractures, taking ca and vit D , not a lot of exercise (6000 steps per day on fitbit) Advance directive  - does not have one  Cognitive function addressed- see scanned forms- and if abnormal then additional documentation follows. -no concerns about  memory   PMH and SH reviewed  Meds, vitals, and allergies reviewed.   ROS: See HPI.  Otherwise negative.    Hyperlipidemia Lab Results  Component Value Date   CHOL 167 05/11/2014   CHOL 167 05/08/2013   CHOL 173 04/27/2012   Lab Results  Component Value Date   HDL 51.60 05/11/2014   HDL 54.40 05/08/2013   HDL 45.00 04/27/2012   Lab Results  Component Value Date   LDLCALC 96 05/11/2014   LDLCALC 85 05/08/2013   LDLCALC 91 04/27/2012   Lab Results  Component Value Date   TRIG 99.0 05/11/2014   TRIG 138.0 05/08/2013   TRIG 186.0* 04/27/2012   Lab Results  Component Value Date   CHOLHDL 3 05/11/2014   CHOLHDL 3 05/08/2013   CHOLHDL 4 04/27/2012   No results found for: LDLDIRECT  On lipitor and diet  Diet is good most of the time   Lab Results  Component Value Date   ALT 24 05/11/2014   AST 24 05/11/2014   ALKPHOS 58 05/11/2014   BILITOT 0.5 05/11/2014     Chemistry      Component Value Date/Time   NA 138 05/11/2014 0830   K 4.6 05/11/2014 0830   CL 102 05/11/2014 0830   CO2 32 05/11/2014 0830   BUN 17 05/11/2014 0830  CREATININE 0.92 05/11/2014 0830      Component Value Date/Time   CALCIUM 10.1 05/11/2014 0830   ALKPHOS 58 05/11/2014 0830   AST 24 05/11/2014 0830   ALT 24 05/11/2014 0830   BILITOT 0.5 05/11/2014 0830      Lab Results  Component Value Date   WBC 4.2 05/11/2014   HGB 12.8 05/11/2014   HCT 36.9 05/11/2014   MCV 88.9 05/11/2014   PLT 212.0 05/11/2014    Lab Results  Component Value Date   TSH 3.35 05/11/2014    Wt is up 1 lb with bmi of 29   Patient Active Problem List   Diagnosis Date Noted  . Encounter for Medicare annual wellness exam 05/18/2014  . Thoracic back pain 11/22/2013  . Dizzy spells 11/13/2013  . Left sided chest pain 11/13/2013  . Rhomboid muscle pain 11/13/2013  . Fatigue 11/13/2013  . Osteopenia 10/20/2013  . GASTROPARESIS 01/02/2008  . ALLERGIC RHINITIS 09/12/2007  . COLONIC POLYPS 03/16/2007  .  GERD 03/16/2007  . Hyperlipidemia 02/21/2007  . Anxiety state 02/21/2007  . IBS 02/21/2007  . DEGENERATIVE JOINT DISEASE 02/21/2007   Past Medical History  Diagnosis Date  . Allergic rhinitis   . Family history of ischemic heart disease   . Hyperlipidemia   . GERD (gastroesophageal reflux disease)   . Gastroparesis   . IBS (irritable bowel syndrome)   . Hx of colonic polyps   . DJD (degenerative joint disease)   . Anxiety   . Blood donor   . Hypercholesteremia   . Arthritis     in back. Physical Therapy 2016  . Depression    Past Surgical History  Procedure Laterality Date  . Left breast biopsy      benign x 2  . Breast biopsy  1980, 1983    Benign Breast Biospy x 2  . Cyst excision  04/2011    Thigh  . Abdominal hysterectomy  1985    TAH/BSO Age 43 DUB, pelvic pain   History  Substance Use Topics  . Smoking status: Never Smoker   . Smokeless tobacco: Never Used  . Alcohol Use: No     Comment: occ   Family History  Problem Relation Age of Onset  . Leukemia Mother    Allergies  Allergen Reactions  . Aspirin     Unable to take a full aspirin--causes palpitations Tachycardia  . Crab [Shellfish Allergy]     Rash from head to toe   Current Outpatient Prescriptions on File Prior to Visit  Medication Sig Dispense Refill  . acetaminophen (TYLENOL) 325 MG tablet Take 650 mg by mouth every 6 (six) hours as needed.    . ALPRAZolam (XANAX) 0.5 MG tablet TAKE 1 TABLET BY MOUTH 3 TIMES A DAY AS NEEDED FOR ANXIETY 90 tablet 3  . atorvastatin (LIPITOR) 20 MG tablet TAKE 1 TABLET (20 MG TOTAL) BY MOUTH DAILY. 90 tablet 1  . calcium gluconate 500 MG tablet Take 500 mg by mouth daily.    [name] Sodium 30-100 MG CAPS Take 2 capsules by mouth daily.    . Cholecalciferol (VITAMIN D) 2000 UNITS CAPS Take 1 capsule by mouth daily.    . Fish Oil-Krill Oil CAPS Take 1 capsule by mouth daily.    . fluticasone (FLONASE) 50 MCG/ACT nasal spray Place 2 sprays into both  nostrils daily. 16 g 5  . Multiple Vitamins-Minerals (MULTIPLE VITAMINS/WOMENS PO) Take 1 tablet by mouth daily.    . pantoprazole (PROTONIX) 40  MG tablet TAKE 1 TABLET BY MOUTH DAILY 90 tablet 3  . promethazine (PHENERGAN) 25 MG tablet TAKE 1/2 TO 1 TABLET BY MOUTH EVERY 4 HOURS AS NEEDED FOR NAUSEA 30 tablet 5   No current facility-administered medications on file prior to visit.        Review of Systems    Review of Systems  Constitutional: Negative for fever, appetite change, fatigue and unexpected weight change.  Eyes: Negative for pain and visual disturbance.  Respiratory: Negative for cough and shortness of breath.   Cardiovascular: Negative for cp or palpitations    Gastrointestinal: Negative for nausea, diarrhea and constipation.  Genitourinary: Negative for urgency and frequency.  Skin: Negative for pallor or rash   MSK pos for back pain  Neurological: Negative for weakness, light-headedness, numbness and headaches.  Hematological: Negative for adenopathy. Does not bruise/bleed easily.  Psychiatric/Behavioral: Negative for dysphoric mood. The patient is not nervous/anxious.      Objective:   Physical Exam  Constitutional: She appears well-developed and well-nourished. No distress.  HENT:  Head: Normocephalic and atraumatic.  Right Ear: External ear normal.  Left Ear: External ear normal.  Nose: Nose normal.  Mouth/Throat: Oropharynx is clear and moist.  Eyes: Conjunctivae and EOM are normal. Pupils are equal, round, and reactive to light. Right eye exhibits no discharge. Left eye exhibits no discharge. No scleral icterus.  Neck: Normal range of motion. Neck supple. No JVD present. Carotid bruit is not present. No thyromegaly present.  Cardiovascular: Normal rate, regular rhythm, normal heart sounds and intact distal pulses.  Exam reveals no gallop.   Pulmonary/Chest: Effort normal and breath sounds normal. No respiratory distress. She has no wheezes. She has no rales.   Abdominal: Soft. Bowel sounds are normal. She exhibits no distension and no mass. There is no tenderness.  Genitourinary:  Had gyn examination with gyn in Jan  Musculoskeletal: She exhibits no edema or tenderness.  Lymphadenopathy:    She has no cervical adenopathy.  Neurological: She is alert. She has normal reflexes. No cranial nerve deficit. She exhibits normal muscle tone. Coordination normal.  Skin: Skin is warm and dry. No rash noted. No erythema. No pallor.  Psychiatric: She has a normal mood and affect.          Assessment & Plan:   Problem List Items Addressed This Visit      Musculoskeletal and Integument   Osteopenia - Primary    Disc need for calcium/ vitamin D/ wt bearing exercise and bone density test every 2 y to monitor Disc safety/ fracture risk in detail  Pt wants to put off another dexa for now No fractures         Other   Encounter for Medicare annual wellness exam    Reviewed health habits including diet and exercise and skin cancer prevention Reviewed appropriate screening tests for age  Also reviewed health mt list, fam hx and immunization status , as well as social and family history   See HPI Labs reviewed  You will be due for a 5 year colonoscopy in July - if you need a referral for it -call us in June  Also - any time you are ready for a bone density test call us  If you are interested in a shingles/zoster vaccine - call your insurance to check on coverage,( you should not get it within 1 month of other vaccines) , then call us for a prescription  for it to take to a pharmacy that  gives the shot , or make a nurse visit to get it here depending on your coverage Don't forget to schedule you mammogram  Also work on an Advance directive       Hyperlipidemia    Disc goals for lipids and reasons to control them Rev labs with pt Rev low sat fat diet in detail  Will continue lipitor and diet       Relevant Medications   atorvastatin (LIPITOR)  tablet

## 2014-05-18 NOTE — Patient Instructions (Signed)
You will be due for a 5 year colonoscopy in July - if you need a referral for it -call us in June  Also - any time you are ready for a bone density test call us  If you are interested in a shingles/zoster vaccine - call your insurance to check on coverage,( you should not get it within 1 month of other vaccines) , then call us for a prescription  for it to take to a pharmacy that gives the shot , or make a nurse visit to get it here depending on your coverage Don't forget to schedule you mammogram  Also work on an Advance directive

## 2014-05-20 NOTE — Assessment & Plan Note (Signed)
Disc goals for lipids and reasons to control them Rev labs with pt Rev low sat fat diet in detail  Will continue lipitor and diet

## 2014-05-20 NOTE — Assessment & Plan Note (Signed)
Reviewed health habits including diet and exercise and skin cancer prevention Reviewed appropriate screening tests for age  Also reviewed health mt list, fam hx and immunization status , as well as social and family history   See HPI Labs reviewed  You will be due for a 5 year colonoscopy in July - if you need a referral for it -call us in June  Also - any time you are ready for a bone density test call us  If you are interested in a shingles/zoster vaccine - call your insurance to check on coverage,( you should not get it within 1 month of other vaccines) , then call us for a prescription  for it to take to a pharmacy that gives the shot , or make a nurse visit to get it here depending on your coverage Don't forget to schedule you mammogram  Also work on an Advance directive

## 2014-05-20 NOTE — Assessment & Plan Note (Signed)
Disc need for calcium/ vitamin D/ wt bearing exercise and bone density test every 2 y to monitor Disc safety/ fracture risk in detail  Pt wants to put off another dexa for now No fractures

## 2014-05-24 ENCOUNTER — Telehealth: Payer: Self-pay | Admitting: Certified Nurse Midwife

## 2014-05-24 NOTE — Telephone Encounter (Signed)
Provider cx. Left message to cb and rs. Recall entered.

## 2014-06-23 ENCOUNTER — Other Ambulatory Visit: Payer: Self-pay | Admitting: Family Medicine

## 2014-06-25 NOTE — Telephone Encounter (Signed)
Px written for call in   

## 2014-06-25 NOTE — Telephone Encounter (Signed)
Electronic refill request, pt had CPE on 05/18/14 and last refilled on 12/07/13 #90 with 3 additional refills, please advise

## 2014-06-25 NOTE — Telephone Encounter (Signed)
Rx called in as prescribe  

## 2014-06-26 ENCOUNTER — Other Ambulatory Visit: Payer: Self-pay

## 2014-06-26 DIAGNOSIS — Z1231 Encounter for screening mammogram for malignant neoplasm of breast: Secondary | ICD-10-CM

## 2014-06-29 ENCOUNTER — Encounter: Payer: Self-pay | Admitting: Gastroenterology

## 2014-07-20 ENCOUNTER — Ambulatory Visit
Admission: RE | Admit: 2014-07-20 | Discharge: 2014-07-20 | Disposition: A | Discharge status: Home / Self Care | Payer: Medicare HMO | Source: Ambulatory Visit

## 2014-07-20 DIAGNOSIS — Z1231 Encounter for screening mammogram for malignant neoplasm of breast: Secondary | ICD-10-CM

## 2014-09-27 ENCOUNTER — Other Ambulatory Visit: Payer: Self-pay | Admitting: Family Medicine

## 2014-09-27 NOTE — Telephone Encounter (Signed)
Electronic refill request, pt has CPE scheduled on 05/24/15, last refilled 06/25/14 #90 with 1 additional refills (Sig: TID)

## 2014-09-27 NOTE — Telephone Encounter (Signed)
Px written for call in   

## 2014-09-27 NOTE — Telephone Encounter (Signed)
Rx called in as prescribed 

## 2014-10-09 ENCOUNTER — Encounter: Payer: Self-pay | Admitting: Gastroenterology

## 2014-12-05 ENCOUNTER — Ambulatory Visit (AMBULATORY_SURGERY_CENTER): Payer: Self-pay

## 2014-12-05 VITALS — Ht 65.0 in | Wt 138.8 lb

## 2014-12-05 DIAGNOSIS — Z860100 Personal history of colon polyps, unspecified: Secondary | ICD-10-CM

## 2014-12-05 MED ORDER — SUPREP BOWEL PREP KIT 17.5-3.13-1.6 GM/177ML PO SOLN: 1.0000 | Freq: Once | ORAL | Status: DC

## 2014-12-05 NOTE — Progress Notes (Signed)
No allergies to eggs or soy No diet/weight loss meds No home oxygen No past problems with anesthesia  Has internet; refused emmi

## 2014-12-11 DIAGNOSIS — Z23 Encounter for immunization: Secondary | ICD-10-CM | POA: Diagnosis not present

## 2014-12-19 ENCOUNTER — Encounter: Payer: Self-pay | Admitting: Gastroenterology

## 2014-12-19 ENCOUNTER — Ambulatory Visit (AMBULATORY_SURGERY_CENTER): Payer: Medicare HMO | Admitting: Gastroenterology

## 2014-12-19 VITALS — BP 140/72 | HR 74 | Temp 96.5°F | Resp 21 | Ht 65.0 in | Wt 138.0 lb

## 2014-12-19 DIAGNOSIS — Z86010 Personal history of colonic polyps: Secondary | ICD-10-CM

## 2014-12-19 DIAGNOSIS — K219 Gastro-esophageal reflux disease without esophagitis: Secondary | ICD-10-CM | POA: Diagnosis not present

## 2014-12-19 MED ORDER — SODIUM CHLORIDE 0.9 % IV SOLN: 500.0000 mL | INTRAVENOUS | Status: DC

## 2014-12-19 NOTE — Patient Instructions (Signed)
Diverticulosis and hemorrhoids seen today. Handouts given: diverticulosis, hemorrhoids, high fiber diet. Repeat colonoscopy in 10 years. Resume current medications. Thank you!  YOU HAD AN ENDOSCOPIC PROCEDURE TODAY AT [facility]:   Refer to the procedure report that was given to you for any specific questions about what was found during the examination.  If the procedure report does not answer your questions, please call your gastroenterologist to clarify.  If you requested that your care partner not be given the details of your procedure findings, then the procedure report has been included in a sealed envelope for you to review at your convenience later.  YOU SHOULD EXPECT: Some feelings of bloating in the abdomen. Passage of more gas than usual.  Walking can help get rid of the air that was put into your GI tract during the procedure and reduce the bloating. If you had a lower endoscopy (such as a colonoscopy or flexible sigmoidoscopy) you may notice spotting of blood in your stool or on the toilet paper. If you underwent a bowel prep for your procedure, you may not have a normal bowel movement for a few days.  Please Note:  You might notice some irritation and congestion in your nose or some drainage.  This is from the oxygen used during your procedure.  There is no need for concern and it should clear up in a day or so.  SYMPTOMS TO REPORT IMMEDIATELY:   Following lower endoscopy (colonoscopy or flexible sigmoidoscopy):  Excessive amounts of blood in the stool  Significant tenderness or worsening of abdominal pains  Swelling of the abdomen that is new, acute  Fever of 100F or higher   For urgent or emergent issues, a gastroenterologist can be reached at any hour by calling [phone].   DIET: Your first meal following the procedure should be a small meal and then it is ok to progress to your normal diet. Heavy or fried foods are harder to digest and may make you feel  nauseous or bloated.  Likewise, meals heavy in dairy and vegetables can increase bloating.  Drink plenty of fluids but you should avoid alcoholic beverages for 24 hours.  ACTIVITY:  You should plan to take it easy for the rest of today and you should NOT DRIVE or use heavy machinery until tomorrow (because of the sedation medicines used during the test).    FOLLOW UP: Our staff will call the number listed on your records the next business day following your procedure to check on you and address any questions or concerns that you may have regarding the information given to you following your procedure. If we do not reach you, we will leave a message.  However, if you are feeling well and you are not experiencing any problems, there is no need to return our call.  We will assume that you have returned to your regular daily activities without incident.  If any biopsies were taken you will be contacted by phone or by letter within the next 1-3 weeks.  Please call us at [phone] if you have not heard about the biopsies in 3 weeks.    SIGNATURES/CONFIDENTIALITY: You and/or your care partner have signed paperwork which will be entered into your electronic medical record.  These signatures attest to the fact that that the information above on your After Visit Summary has been reviewed and is understood.  Full responsibility of the confidentiality of this discharge information lies with you and/or your care-partner.

## 2014-12-19 NOTE — Progress Notes (Signed)
Report to PACU, RN, vss, BBS= Clear.  

## 2014-12-19 NOTE — Op Note (Signed)
[facility]  [address] [place], [id number]   COLONOSCOPY PROCEDURE REPORT  PATIENT: Tabitha Carlson, Tabitha Carlson  MR#: [id number] BIRTHDATE: 1945-10-03 , 23  yrs. old GENDER: female ENDOSCOPIST: Ladene Artist, MD, [facility] PROCEDURE DATE:  12/19/2014 PROCEDURE:   Colonoscopy, surveillance First Screening Colonoscopy - Avg.  risk and is 50 yrs.  old or older - No.  Prior Negative Screening - Now for repeat screening. N/A  History of Adenoma - Now for follow-up colonoscopy & has been > or = to 3 yrs.  Yes hx of adenoma.  Has been 3 or more years since last colonoscopy.  Polyps removed today? No Recommend repeat exam, <10 yrs? No ASA CLASS:   Class II INDICATIONS:Surveillance due to prior colonic neoplasia and PH Colon Adenoma. MEDICATIONS: Monitored anesthesia care and Propofol 230 mg IV DESCRIPTION OF PROCEDURE:   After the risks benefits and alternatives of the procedure were thoroughly explained, informed consent was obtained.  The digital rectal exam revealed no abnormalities of the rectum.   The [id number]  endoscope was introduced through the anus and advanced to the cecum, which was identified by both the appendix and ileocecal valve. No adverse events experienced.   The quality of the prep was excellent. (Suprep was used)  The instrument was then slowly withdrawn as the colon was fully examined. Estimated blood loss is zero unless otherwise noted in this procedure report.    COLON FINDINGS: There was moderate diverticulosis noted in the sigmoid colon and descending colon.   The examination was otherwise normal.  Retroflexed views revealed internal Grade I hemorrhoids. The time to cecum = 5.5 Withdrawal time = 8.5   The scope was withdrawn and the procedure completed. COMPLICATIONS: There were no immediate complications.  ENDOSCOPIC IMPRESSION: 1.   Moderate diverticulosis in the sigmoid colon and descending colon 2.   Grade l internal  hemorrhoids  RECOMMENDATIONS: 1.  High fiber diet with liberal fluid intake. 2.  Continue current colorectal screening with a repeat colonoscopy in 10 years.  eSigned:  Ladene Artist, MD, [facility] 12/19/2014 11:49 AM

## 2014-12-20 ENCOUNTER — Telehealth: Payer: Self-pay | Admitting: Emergency Medicine

## 2014-12-20 NOTE — Telephone Encounter (Signed)
  Follow up Call-  Call back number 12/19/2014  Post procedure Call Back phone  # [phone]  Permission to leave phone message Yes     Patient questions:  Do you have a fever, pain , or abdominal swelling? No. Pain Score  0 *  Have you tolerated food without any problems? Yes.    Have you been able to return to your normal activities? Yes.    Do you have any questions about your discharge instructions: Diet   No. Medications  No. Follow up visit  No.  Do you have questions or concerns about your Care? No.  Actions: * If pain score is 4 or above: No action needed, pain <4.

## 2015-01-14 ENCOUNTER — Ambulatory Visit (INDEPENDENT_AMBULATORY_CARE_PROVIDER_SITE_OTHER)
Admission: RE | Admit: 2015-01-14 | Discharge: 2015-01-14 | Disposition: A | Discharge status: Home / Self Care | Payer: Medicare HMO | Source: Ambulatory Visit | Attending: Family Medicine | Admitting: Family Medicine

## 2015-01-14 ENCOUNTER — Encounter: Payer: Self-pay | Admitting: Family Medicine

## 2015-01-14 ENCOUNTER — Ambulatory Visit (INDEPENDENT_AMBULATORY_CARE_PROVIDER_SITE_OTHER): Payer: Medicare HMO | Admitting: Family Medicine

## 2015-01-14 VITALS — BP 144/82 | HR 93 | Temp 98.0°F | Ht 65.5 in | Wt 136.8 lb

## 2015-01-14 DIAGNOSIS — M79672 Pain in left foot: Secondary | ICD-10-CM | POA: Insufficient documentation

## 2015-01-14 DIAGNOSIS — S99912A Unspecified injury of left ankle, initial encounter: Secondary | ICD-10-CM | POA: Diagnosis not present

## 2015-01-14 DIAGNOSIS — M7989 Other specified soft tissue disorders: Secondary | ICD-10-CM | POA: Diagnosis not present

## 2015-01-14 DIAGNOSIS — S99919A Unspecified injury of unspecified ankle, initial encounter: Secondary | ICD-10-CM | POA: Insufficient documentation

## 2015-01-14 NOTE — Assessment & Plan Note (Signed)
Foot and ankle pain after inversion injury on Sat with swelling and bruising and lateral point tenderness No fx on xr Suspect ankle sprain  Pending rad review  Fit with a lace up ankle support  Enc use of crutch or cane short term as needed Ice/elevate and nsaid as tolerated  Given info on care and rehab for ankle sprain  Will f/u

## 2015-01-14 NOTE — Assessment & Plan Note (Signed)
Foot and ankle pain after inversion injury on Sat with swelling and bruising and lateral point tenderness No fx on xr Suspect ankle sprain  Pending rad review  Fit with a lace up ankle support  Enc use of crutch or cane short term as needed Ice/elevate and nsaid as tolerated  Given info on care and rehab for ankle sprain  Will f/u  Consider sport med eval if no improvement

## 2015-01-14 NOTE — Progress Notes (Signed)
Pre visit review using our clinic review tool, if applicable. No additional management support is needed unless otherwise documented below in the visit note. 

## 2015-01-14 NOTE — Progress Notes (Signed)
Subjective:    Patient ID: Tabitha Carlson, female    DOB: February 24, 1945, 69 y.o.   MRN: [id number]  HPI Here with an ankle injury   Inverted L ankle stepping over a manhole at [employer] on Saturday  She fell and scraped her R knee  She tried to keep going   Hurt quite badly initially  Was able to get up with assistance and walked to the car with a limp   Went home and put ice on it Some heat  1 pill of advil 4 times per day Elevating it and moving a little bit   Has never injured ankle before   Patient Active Problem List   Diagnosis Date Noted  . Encounter for Medicare annual wellness exam 05/18/2014  . Thoracic back pain 11/22/2013  . Dizzy spells 11/13/2013  . Left sided chest pain 11/13/2013  . Rhomboid muscle pain 11/13/2013  . Fatigue 11/13/2013  . Osteopenia 10/20/2013  . GASTROPARESIS 01/02/2008  . ALLERGIC RHINITIS 09/12/2007  . COLONIC POLYPS 03/16/2007  . GERD 03/16/2007  . Hyperlipidemia 02/21/2007  . Anxiety state 02/21/2007  . IBS 02/21/2007  . DEGENERATIVE JOINT DISEASE 02/21/2007   Past Medical History  Diagnosis Date  . Allergic rhinitis   . Family history of ischemic heart disease   . Hyperlipidemia   . GERD (gastroesophageal reflux disease)   . Gastroparesis   . IBS (irritable bowel syndrome)   . Hx of colonic polyps   . DJD (degenerative joint disease)   . Anxiety   . Blood donor   . Hypercholesteremia   . Arthritis     in back. Physical Therapy 2016  . Depression    Past Surgical History  Procedure Laterality Date  . Left breast biopsy      benign x 2  . Breast biopsy  1980, 1983    Benign Breast Biospy x 2  . Cyst excision  04/2011    Thigh  . Abdominal hysterectomy  1985    TAH/BSO Age 60 DUB, pelvic pain   Social History  Substance Use Topics  . Smoking status: Never Smoker   . Smokeless tobacco: Never Used  . Alcohol Use: No     Comment: rare   Family History  Problem Relation Age of Onset  . Leukemia Mother   .  Colon cancer Neg Hx    Allergies  Allergen Reactions  . Aspirin     Unable to take a full aspirin--causes palpitations Tachycardia  . Crab [Shellfish Allergy]     Rash from head to toe   Current Outpatient Prescriptions on File Prior to Visit  Medication Sig Dispense Refill  . acetaminophen (TYLENOL) 325 MG tablet Take 650 mg by mouth every 6 (six) hours as needed.    . ALPRAZolam (XANAX) 0.5 MG tablet TAKE 1 TABLET BY MOUTH THREE TIMES DAILY AS NEEDED. 90 tablet 3  . atorvastatin (LIPITOR) 20 MG tablet TAKE 1 TABLET (20 MG TOTAL) BY MOUTH DAILY. 90 tablet 3  . calcium gluconate 500 MG tablet Take 500 mg by mouth daily.    [name] Sodium 30-100 MG CAPS Take 2 capsules by mouth daily.    . Cholecalciferol (VITAMIN D) 2000 UNITS CAPS Take 1 capsule by mouth daily.    . Fish Oil-Krill Oil CAPS Take 1 capsule by mouth daily.    . Multiple Vitamins-Minerals (MULTIPLE VITAMINS/WOMENS PO) Take 1 tablet by mouth daily.    Marland Kitchen OVER THE COUNTER MEDICATION Fiber gummies    .  pantoprazole (PROTONIX) 40 MG tablet TAKE 1 TABLET BY MOUTH DAILY 90 tablet 3  . promethazine (PHENERGAN) 25 MG tablet TAKE 1/2 TO 1 TABLET BY MOUTH EVERY 4 HOURS AS NEEDED FOR NAUSEA 30 tablet 5   No current facility-administered medications on file prior to visit.      Review of Systems Review of Systems  Constitutional: Negative for fever, appetite change, fatigue and unexpected weight change.  Eyes: Negative for pain and visual disturbance.  Respiratory: Negative for cough and shortness of breath.   Cardiovascular: Negative for cp or palpitations    Gastrointestinal: Negative for nausea, diarrhea and constipation.  Genitourinary: Negative for urgency and frequency.  Skin: Negative for pallor or rash   MSK pos for pain and swelling of L ankle and foot  Neurological: Negative for weakness, light-headedness, numbness and headaches.  Hematological: Negative for adenopathy. Does not bruise/bleed easily.    Psychiatric/Behavioral: Negative for dysphoric mood. The patient is not nervous/anxious.         Objective:   Physical Exam  Constitutional: She appears well-developed and well-nourished. No distress.  Well appearing   HENT:  Head: Normocephalic and atraumatic.  Eyes: Conjunctivae and EOM are normal. Pupils are equal, round, and reactive to light.  Neck: Normal range of motion.  Cardiovascular: Normal rate and regular rhythm.   Musculoskeletal: She exhibits edema and tenderness.       Left ankle: She exhibits swelling and ecchymosis. She exhibits no deformity and normal pulse. Tenderness. Lateral malleolus, posterior TFL, head of 5th metatarsal and proximal fibula tenderness found. Achilles tendon normal.       Left foot: There is tenderness, bony tenderness and swelling. There is normal capillary refill, no crepitus, no deformity and no laceration.  L ankle and foot are diffusely swollen - worse laterally Ecchymosis -also worse laterally  Most tender areas are Lateral malleolus/ post tib fib area and 5th metatarsal   Nl rom with most pain on full dorsiflexion and inversion  Nl perf and innervated  Can bear partial weight (limps)     Neurological: She is alert. She has normal strength and normal reflexes. She displays no atrophy. No sensory deficit. She exhibits normal muscle tone.  Skin: Skin is warm and dry. No erythema.  Psychiatric: She has a normal mood and affect.          Assessment & Plan:   Problem List Items Addressed This Visit      Other   Ankle injury - Primary    Foot and ankle pain after inversion injury on Sat with swelling and bruising and lateral point tenderness No fx on xr Suspect ankle sprain  Pending rad review  Fit with a lace up ankle support  Enc use of crutch or cane short term as needed Ice/elevate and nsaid as tolerated  Given info on care and rehab for ankle sprain  Will f/u       Relevant Orders   DG Foot Complete Left (Completed)    DG Ankle Complete Left (Completed)   Left foot pain    Foot and ankle pain after inversion injury on Sat with swelling and bruising and lateral point tenderness No fx on xr Suspect ankle sprain  Pending rad review  Fit with a lace up ankle support  Enc use of crutch or cane short term as needed Ice/elevate and nsaid as tolerated  Given info on care and rehab for ankle sprain  Will f/u  Consider sport med eval if no improvement  Relevant Orders   DG Foot Complete Left (Completed)   DG Ankle Complete Left (Completed)

## 2015-01-14 NOTE — Patient Instructions (Signed)
I think you have an ankle sprain - but waiting on radiology reading  Use the brace when walking  Get a cane or crutch if needed to offload pressure with walking  Elevate and ice when you can   Ibuprofen as needed with food   Will instruct further with xray reading

## 2015-02-27 DIAGNOSIS — Z0101 Encounter for examination of eyes and vision with abnormal findings: Secondary | ICD-10-CM | POA: Diagnosis not present

## 2015-03-04 ENCOUNTER — Ambulatory Visit: Payer: Medicare HMO | Admitting: Certified Nurse Midwife

## 2015-03-07 ENCOUNTER — Encounter: Payer: Self-pay | Admitting: Certified Nurse Midwife

## 2015-03-07 ENCOUNTER — Ambulatory Visit (INDEPENDENT_AMBULATORY_CARE_PROVIDER_SITE_OTHER): Payer: Medicare HMO | Admitting: Certified Nurse Midwife

## 2015-03-07 VITALS — BP 124/76 | HR 70 | Resp 16 | Ht 65.5 in | Wt 139.0 lb

## 2015-03-07 DIAGNOSIS — Z01419 Encounter for gynecological examination (general) (routine) without abnormal findings: Secondary | ICD-10-CM

## 2015-03-07 DIAGNOSIS — Z0000 Encounter for general adult medical examination without abnormal findings: Secondary | ICD-10-CM

## 2015-03-07 LAB — POCT URINALYSIS DIPSTICK
BILIRUBIN UA: NEGATIVE
Glucose, UA: NEGATIVE
Ketones, UA: NEGATIVE
Leukocytes, UA: NEGATIVE
NITRITE UA: NEGATIVE
PH UA: 5
Protein, UA: NEGATIVE
RBC UA: NEGATIVE
UROBILINOGEN UA: NEGATIVE

## 2015-03-07 NOTE — Progress Notes (Signed)
70 y.o. [id number] Married  Caucasian Fe here for annual exam. Menopausal no HRT.  Denies vaginal bleeding or vaginal dryness. Does have slight pink occasionally with sexual activity and no other time.Does always use lubrication. Sees Dr. Glori Bickers yearly for labs and medication management of cholesterol and anxiety. Social stress with spouse and sisters hospitalizations, all doing well now. No health concerns today. Just returned from [facility]!  No LMP recorded. Patient has had a hysterectomy.          Sexually active: Yes.    The current method of family planning is status post hysterectomy.    Exercising: Yes.    walkign, gym, silver sneakers Smoker:  no  Health Maintenance: Pap:  4/03 neg MMG: 07-20-14 category c density,birads 1:neg Colonoscopy: 12/16 f/u 85yrs BMD:  11/12 TDaP: 2012 Shingles: 2016 Pneumonia: 2015 Hep C and HIV: not done Labs: poct urine-neg Self breast exam: every 74mths   reports that she has never smoked. She has never used smokeless tobacco. She reports that she does not drink alcohol or use illicit drugs.  Past Medical History  Diagnosis Date  . Allergic rhinitis   . Family history of ischemic heart disease   . Hyperlipidemia   . GERD (gastroesophageal reflux disease)   . Gastroparesis   . IBS (irritable bowel syndrome)   . Hx of colonic polyps   . DJD (degenerative joint disease)   . Anxiety   . Blood donor   . Hypercholesteremia   . Arthritis     in back. Physical Therapy 2016  . Depression     Past Surgical History  Procedure Laterality Date  . Left breast biopsy      benign x 2  . Breast biopsy  1980, 1983    Benign Breast Biospy x 2  . Cyst excision  04/2011    Thigh  . Abdominal hysterectomy  1985    TAH/BSO Age 50 DUB, pelvic pain    Current Outpatient Prescriptions  Medication Sig Dispense Refill  . acetaminophen (TYLENOL) 325 MG tablet Take 650 mg by mouth every 6 (six) hours as needed.    . ALPRAZolam (XANAX) 0.5 MG tablet TAKE 1  TABLET BY MOUTH THREE TIMES DAILY AS NEEDED. 90 tablet 3  . atorvastatin (LIPITOR) 20 MG tablet TAKE 1 TABLET (20 MG TOTAL) BY MOUTH DAILY. 90 tablet 3  . calcium gluconate 500 MG tablet Take 500 mg by mouth daily.    [name] Sodium 30-100 MG CAPS Take 2 capsules by mouth daily.    . Cholecalciferol (VITAMIN D) 2000 UNITS CAPS Take 1 capsule by mouth daily.    . Fish Oil-Krill Oil CAPS Take 1 capsule by mouth daily.    . Multiple Vitamins-Minerals (MULTIPLE VITAMINS/WOMENS PO) Take 1 tablet by mouth daily.    Marland Kitchen OVER THE COUNTER MEDICATION Fiber gummies    . pantoprazole (PROTONIX) 40 MG tablet TAKE 1 TABLET BY MOUTH DAILY 90 tablet 3  . promethazine (PHENERGAN) 25 MG tablet TAKE 1/2 TO 1 TABLET BY MOUTH EVERY 4 HOURS AS NEEDED FOR NAUSEA 30 tablet 5   No current facility-administered medications for this visit.    Family History  Problem Relation Age of Onset  . Leukemia Mother   . Colon cancer Neg Hx   . Hyperlipidemia Sister   . Other Sister     2 cardiac episodes    ROS:  Pertinent items are noted in HPI.  Otherwise, a comprehensive ROS was negative.  Exam:   BP 124/76  mmHg  Pulse 70  Resp 16  Ht 5' 5.5" (1.664 m)  Wt 139 lb (63.05 kg)  BMI 22.77 kg/m2 Height: 5' 5.5" (166.4 cm) Ht Readings from Last 3 Encounters:  03/07/15 5' 5.5" (1.664 m)  01/14/15 5' 5.5" (1.664 m)  12/19/14 5\' 5"  (1.651 m)    General appearance: alert, cooperative and appears stated age Head: Normocephalic, without obvious abnormality, atraumatic Neck: no adenopathy, supple, symmetrical, trachea midline and thyroid normal to inspection and palpation Lungs: clear to auscultation bilaterally Breasts: normal appearance, no masses or tenderness, No nipple retraction or dimpling, No nipple discharge or bleeding, No axillary or supraclavicular adenopathy Heart: regular rate and rhythm Abdomen: soft, non-tender; no masses,  no organomegaly Extremities: extremities normal, atraumatic, no  cyanosis or edema Skin: Skin color, texture, turgor normal. No rashes or lesions Lymph nodes: Cervical, supraclavicular, and axillary nodes normal. No abnormal inguinal nodes palpated Neurologic: Grossly normal   Pelvic: External genitalia:  no lesions              Urethra:  normal appearing urethra with no masses, tenderness or lesions              Bartholin's and Skene's: normal                 Vagina: normal appearing vagina with normal color and discharge, no lesions, atrophic appearance              Cervix: absent              Pap taken: No. Bimanual Exam:  Uterus:  uterus absent              Adnexa: no mass, fullness, tenderness and adnexa absent               Rectovaginal: Confirms               Anus:  normal sphincter tone, no lesions  Chaperone present: yes  A:  Well Woman with normal exam  Menopausal no HRT now weaned off in fall 2016, no issues.  S/P TAH/BSO for pelvic pain/DUB  Atrophic vaginitis  Anxiety/cholesterol management with PCP  Social stress with family illness, but resolving  P:   Reviewed health and wellness pertinent to exam  Discussed atrophic vaginitis finding and recommend trial of coconut oil twice weekly with applicator use. Questions addressed and instructions given. Encouraged to use with sexual activity to see if occasional pink discharge stops. Patient feels this is good idea. Will advise if no change or problems.  Continue follow up with PCP as indicated  Pap smear as above not taken   counseled on breast self exam, mammography screening, adequate intake of calcium and vitamin D, diet and exercise, Kegel's exercises,Discussed BMD due, patient will call and schedule.  return annually or prn  An After Visit Summary was printed and given to the patient.

## 2015-03-07 NOTE — Patient Instructions (Addendum)

## 2015-03-10 NOTE — Progress Notes (Signed)
Reviewed personally.  M. Suzanne Kampbell Holaway, MD.  

## 2015-04-01 ENCOUNTER — Other Ambulatory Visit: Payer: Self-pay | Admitting: Family Medicine

## 2015-04-01 NOTE — Telephone Encounter (Signed)
Px written for call in   

## 2015-04-01 NOTE — Telephone Encounter (Signed)
Pt has CPE scheduled on 06/04/15, last refilled on 03/29/14 #90 with 3 refills, please advise

## 2015-04-01 NOTE — Telephone Encounter (Signed)
Rx called in as prescribed 

## 2015-05-18 ENCOUNTER — Telehealth: Payer: Self-pay | Admitting: Family Medicine

## 2015-05-18 DIAGNOSIS — Z0000 Encounter for general adult medical examination without abnormal findings: Secondary | ICD-10-CM | POA: Insufficient documentation

## 2015-05-18 DIAGNOSIS — E785 Hyperlipidemia, unspecified: Secondary | ICD-10-CM

## 2015-05-18 NOTE — Telephone Encounter (Signed)
-----   Message from Marchia Bond sent at 05/14/2015  8:31 AM EDT ----- Regarding: Cpx labs Tues 4/11, need orders. Thanks! :-) Please order  future cpx labs for pt's upcoming lab appt. Thanks Aniceto Boss

## 2015-05-21 ENCOUNTER — Other Ambulatory Visit (INDEPENDENT_AMBULATORY_CARE_PROVIDER_SITE_OTHER): Payer: Medicare HMO

## 2015-05-21 DIAGNOSIS — E785 Hyperlipidemia, unspecified: Secondary | ICD-10-CM

## 2015-05-21 DIAGNOSIS — Z0000 Encounter for general adult medical examination without abnormal findings: Secondary | ICD-10-CM | POA: Diagnosis not present

## 2015-05-21 LAB — CBC WITH DIFFERENTIAL/PLATELET
Basophils Absolute: 0 10*3/uL (ref 0.0–0.1)
Basophils Relative: 0.8 % (ref 0.0–3.0)
EOS ABS: 0.2 10*3/uL (ref 0.0–0.7)
Eosinophils Relative: 4.7 % (ref 0.0–5.0)
HEMATOCRIT: 39.4 % (ref 36.0–46.0)
HEMOGLOBIN: 13.3 g/dL (ref 12.0–15.0)
LYMPHS PCT: 53.9 % — AB (ref 12.0–46.0)
Lymphs Abs: 2.5 10*3/uL (ref 0.7–4.0)
MCHC: 33.8 g/dL (ref 30.0–36.0)
MCV: 89.9 fl (ref 78.0–100.0)
MONO ABS: 0.4 10*3/uL (ref 0.1–1.0)
Monocytes Relative: 8.3 % (ref 3.0–12.0)
Neutro Abs: 1.5 10*3/uL (ref 1.4–7.7)
Neutrophils Relative %: 32.3 % — ABNORMAL LOW (ref 43.0–77.0)
Platelets: 192 10*3/uL (ref 150.0–400.0)
RBC: 4.39 Mil/uL (ref 3.87–5.11)
RDW: 12.9 % (ref 11.5–15.5)
WBC: 4.6 10*3/uL (ref 4.0–10.5)

## 2015-05-21 LAB — COMPREHENSIVE METABOLIC PANEL
ALBUMIN: 4.4 g/dL (ref 3.5–5.2)
ALK PHOS: 65 U/L (ref 39–117)
ALT: 20 U/L (ref 0–35)
AST: 21 U/L (ref 0–37)
BILIRUBIN TOTAL: 0.4 mg/dL (ref 0.2–1.2)
BUN: 18 mg/dL (ref 6–23)
CO2: 31 mEq/L (ref 19–32)
CREATININE: 0.89 mg/dL (ref 0.40–1.20)
Calcium: 10.3 mg/dL (ref 8.4–10.5)
Chloride: 103 mEq/L (ref 96–112)
GFR: 66.97 mL/min (ref >60.00)
Glucose, Bld: 94 mg/dL (ref 70–99)
Potassium: 4 mEq/L (ref 3.5–5.1)
SODIUM: 142 meq/L (ref 135–145)
TOTAL PROTEIN: 7.2 g/dL (ref 6.0–8.3)

## 2015-05-21 LAB — LIPID PANEL
CHOLESTEROL: 165 mg/dL (ref 0–200)
HDL: 57.6 mg/dL (ref >39.00)
LDL CALC: 85 mg/dL (ref 0–99)
NonHDL: 107.65
Total CHOL/HDL Ratio: 3
Triglycerides: 115 mg/dL (ref 0.0–149.0)
VLDL: 23 mg/dL (ref 0.0–40.0)

## 2015-05-21 LAB — TSH: TSH: 3.19 u[IU]/mL (ref 0.35–4.50)

## 2015-05-24 ENCOUNTER — Encounter: Payer: Medicare HMO | Admitting: Family Medicine

## 2015-06-04 ENCOUNTER — Ambulatory Visit (INDEPENDENT_AMBULATORY_CARE_PROVIDER_SITE_OTHER): Payer: Medicare HMO | Admitting: Family Medicine

## 2015-06-04 ENCOUNTER — Encounter: Payer: Self-pay | Admitting: Family Medicine

## 2015-06-04 ENCOUNTER — Other Ambulatory Visit: Payer: Self-pay | Admitting: Family Medicine

## 2015-06-04 VITALS — BP 118/72 | HR 79 | Temp 97.9°F | Ht 65.5 in | Wt 140.5 lb

## 2015-06-04 DIAGNOSIS — Z23 Encounter for immunization: Secondary | ICD-10-CM

## 2015-06-04 DIAGNOSIS — E785 Hyperlipidemia, unspecified: Secondary | ICD-10-CM

## 2015-06-04 DIAGNOSIS — M8580 Other specified disorders of bone density and structure, unspecified site: Secondary | ICD-10-CM

## 2015-06-04 DIAGNOSIS — E2839 Other primary ovarian failure: Secondary | ICD-10-CM

## 2015-06-04 DIAGNOSIS — Z0000 Encounter for general adult medical examination without abnormal findings: Secondary | ICD-10-CM | POA: Diagnosis not present

## 2015-06-04 DIAGNOSIS — Z1231 Encounter for screening mammogram for malignant neoplasm of breast: Secondary | ICD-10-CM

## 2015-06-04 DIAGNOSIS — L989 Disorder of the skin and subcutaneous tissue, unspecified: Secondary | ICD-10-CM | POA: Insufficient documentation

## 2015-06-04 MED ORDER — ATORVASTATIN CALCIUM 20 MG PO TABS: ORAL_TABLET | ORAL | Status: DC

## 2015-06-04 MED ORDER — PANTOPRAZOLE SODIUM 40 MG PO TBEC: DELAYED_RELEASE_TABLET | ORAL | Status: DC

## 2015-06-04 NOTE — Progress Notes (Signed)
Subjective:    Patient ID: Tabitha Carlson, female    DOB: 08/22/1945, 70 y.o.   MRN: [id number]  HPI Here for health maintenance exam and to review chronic medical problems    A little arthritis in her knees - bothers her a bit  [facility]   Has [facility] visit in May with Riley Lam is stable with bmi of 23  Has some skin spots to check   Had gyn visit in 1/17  PNA- due for pcv 13 Flu shot 9/16  Mm 6/16 normal Self breast exam- no lumps   dexa 11/12 osteopenia Has not set it up yet  Wants another  Takes her ca and D  Injured ankle in dec  No fractures   Td 2/12  colnonoscopy 11/16 nl- 10 year recall   Zoster vaccine 11/16   Hyperlipidemia Lab Results  Component Value Date   CHOL 165 05/21/2015   CHOL 167 05/11/2014   CHOL 167 05/08/2013   Lab Results  Component Value Date   HDL 57.60 05/21/2015   HDL 51.60 05/11/2014   HDL 54.40 05/08/2013   Lab Results  Component Value Date   LDLCALC 85 05/21/2015   LDLCALC 96 05/11/2014   LDLCALC 85 05/08/2013   Lab Results  Component Value Date   TRIG 115.0 05/21/2015   TRIG 99.0 05/11/2014   TRIG 138.0 05/08/2013   Lab Results  Component Value Date   CHOLHDL 3 05/21/2015   CHOLHDL 3 05/11/2014   CHOLHDL 3 05/08/2013   No results found for: LDLDIRECT  lipitor and diet - very good profile   Results for orders placed or performed in visit on 05/21/15  CBC with Differential/Platelet  Result Value Ref Range   WBC 4.6 4.0 - 10.5 K/uL   RBC 4.39 3.87 - 5.11 Mil/uL   Hemoglobin 13.3 12.0 - 15.0 g/dL   HCT 39.4 36.0 - 46.0 %   MCV 89.9 78.0 - 100.0 fl   MCHC 33.8 30.0 - 36.0 g/dL   RDW 12.9 11.5 - 15.5 %   Platelets 192.0 150.0 - 400.0 K/uL   Neutrophils Relative % 32.3 (L) 43.0 - 77.0 %   Lymphocytes Relative 53.9 (H) 12.0 - 46.0 %   Monocytes Relative 8.3 3.0 - 12.0 %   Eosinophils Relative 4.7 0.0 - 5.0 %   Basophils Relative 0.8 0.0 - 3.0 %   Neutro Abs 1.5 1.4 - 7.7 K/uL   Lymphs Abs  2.5 0.7 - 4.0 K/uL   Monocytes Absolute 0.4 0.1 - 1.0 K/uL   Eosinophils Absolute 0.2 0.0 - 0.7 K/uL   Basophils Absolute 0.0 0.0 - 0.1 K/uL  Comprehensive metabolic panel  Result Value Ref Range   Sodium 142 135 - 145 mEq/L   Potassium 4.0 3.5 - 5.1 mEq/L   Chloride 103 96 - 112 mEq/L   CO2 31 19 - 32 mEq/L   Glucose, Bld 94 70 - 99 mg/dL   BUN 18 6 - 23 mg/dL   Creatinine, Ser 0.89 0.40 - 1.20 mg/dL   Total Bilirubin 0.4 0.2 - 1.2 mg/dL   Alkaline Phosphatase 65 39 - 117 U/L   AST 21 0 - 37 U/L   ALT 20 0 - 35 U/L   Total Protein 7.2 6.0 - 8.3 g/dL   Albumin 4.4 3.5 - 5.2 g/dL   Calcium 10.3 8.4 - 10.5 mg/dL   GFR 66.97 >60.00 mL/min  Lipid panel  Result Value Ref Range  Cholesterol 165 0 - 200 mg/dL   Triglycerides 115.0 0.0 - 149.0 mg/dL   HDL 57.60 >39.00 mg/dL   VLDL 23.0 0.0 - 40.0 mg/dL   LDL Cholesterol 85 0 - 99 mg/dL   Total CHOL/HDL Ratio 3    NonHDL 107.65   TSH  Result Value Ref Range   TSH 3.19 0.35 - 4.50 uIU/mL   Good labs overall   Patient Active Problem List   Diagnosis Date Noted  . Estrogen deficiency 06/04/2015  . Skin lesion of back 06/04/2015  . Routine general medical examination at a health care facility 05/18/2015  . Ankle injury 01/14/2015  . Left foot pain 01/14/2015  . Encounter for Medicare annual wellness exam 05/18/2014  . Thoracic back pain 11/22/2013  . Left sided chest pain 11/13/2013  . Rhomboid muscle pain 11/13/2013  . Fatigue 11/13/2013  . Osteopenia 10/20/2013  . GASTROPARESIS 01/02/2008  . ALLERGIC RHINITIS 09/12/2007  . COLONIC POLYPS 03/16/2007  . GERD 03/16/2007  . Hyperlipidemia 02/21/2007  . Anxiety state 02/21/2007  . IBS 02/21/2007  . DEGENERATIVE JOINT DISEASE 02/21/2007   Past Medical History  Diagnosis Date  . Allergic rhinitis   . Family history of ischemic heart disease   . Hyperlipidemia   . GERD (gastroesophageal reflux disease)   . Gastroparesis   . IBS (irritable bowel syndrome)   . Hx of  colonic polyps   . DJD (degenerative joint disease)   . Anxiety   . Blood donor   . Hypercholesteremia   . Arthritis     in back. Physical Therapy 2016  . Depression    Past Surgical History  Procedure Laterality Date  . Left breast biopsy      benign x 2  . Breast biopsy  1980, 1983    Benign Breast Biospy x 2  . Cyst excision  04/2011    Thigh  . Abdominal hysterectomy  1985    TAH/BSO Age 42 DUB, pelvic pain   Social History  Substance Use Topics  . Smoking status: Never Smoker   . Smokeless tobacco: Never Used  . Alcohol Use: No   Family History  Problem Relation Age of Onset  . Leukemia Mother   . Colon cancer Neg Hx   . Hyperlipidemia Sister   . Other Sister     2 cardiac episodes   Allergies  Allergen Reactions  . Aspirin     Unable to take a full aspirin--causes palpitations Tachycardia  . Crab [Shellfish Allergy]     Rash from head to toe   Current Outpatient Prescriptions on File Prior to Visit  Medication Sig Dispense Refill  . acetaminophen (TYLENOL) 325 MG tablet Take 650 mg by mouth every 6 (six) hours as needed.    . ALPRAZolam (XANAX) 0.5 MG tablet TAKE 1 TABLET BY MOUTH THREE TIMES DAILY AS NEEDED 90 tablet 1  . calcium gluconate 500 MG tablet Take 500 mg by mouth daily.    [name] Sodium 30-100 MG CAPS Take 2 capsules by mouth daily.    . Cholecalciferol (VITAMIN D) 2000 UNITS CAPS Take 1 capsule by mouth daily.    . Fish Oil-Krill Oil CAPS Take 1 capsule by mouth daily.    . Multiple Vitamins-Minerals (MULTIPLE VITAMINS/WOMENS PO) Take 1 tablet by mouth daily.    Marland Kitchen OVER THE COUNTER MEDICATION Fiber gummies    . promethazine (PHENERGAN) 25 MG tablet TAKE 1/2 TO 1 TABLET BY MOUTH EVERY 4 HOURS AS NEEDED FOR NAUSEA 30 tablet  5   No current facility-administered medications on file prior to visit.    Review of Systems Review of Systems  Constitutional: Negative for fever, appetite change, fatigue and unexpected weight change.    Eyes: Negative for pain and visual disturbance.  Respiratory: Negative for cough and shortness of breath.   Cardiovascular: Negative for cp or palpitations    Gastrointestinal: Negative for nausea, diarrhea and constipation.  Genitourinary: Negative for urgency and frequency.  Skin: Negative for pallor or rash   MSK pos for knee pain after activity Neurological: Negative for weakness, light-headedness, numbness and headaches.  Hematological: Negative for adenopathy. Does not bruise/bleed easily.  Psychiatric/Behavioral: Negative for dysphoric mood. The patient is sometimes nervous/anxious.         Objective:   Physical Exam  Constitutional: She appears well-developed and well-nourished. No distress.  Well appearing   HENT:  Head: Normocephalic and atraumatic.  Right Ear: External ear normal.  Left Ear: External ear normal.  Nose: Nose normal.  Mouth/Throat: Oropharynx is clear and moist.  Eyes: Conjunctivae and EOM are normal. Pupils are equal, round, and reactive to light. Right eye exhibits no discharge. Left eye exhibits no discharge. No scleral icterus.  Neck: Normal range of motion. Neck supple. No JVD present. Carotid bruit is not present. No thyromegaly present.  Cardiovascular: Normal rate, regular rhythm, normal heart sounds and intact distal pulses.  Exam reveals no gallop.   Pulmonary/Chest: Effort normal and breath sounds normal. No respiratory distress. She has no wheezes. She has no rales.  Abdominal: Soft. Bowel sounds are normal. She exhibits no distension and no mass. There is no tenderness.  Musculoskeletal: She exhibits no edema or tenderness.  No knee tenderness  Lymphadenopathy:    She has no cervical adenopathy.  Neurological: She is alert. She has normal reflexes. No cranial nerve deficit. She exhibits normal muscle tone. Coordination normal.  Skin: Skin is warm and dry. No rash noted. No erythema. No pallor.  Lentigines diffusely   3-4 mm oval  erythematous scabbed area on L back     Psychiatric: She has a normal mood and affect.          Assessment & Plan:   Problem List Items Addressed This Visit      Musculoskeletal and Integument   Osteopenia    Ref for dexa  Disc need for calcium/ vitamin D/ wt bearing exercise and bone density test every 2 y to monitor Disc safety/ fracture risk in detail        Skin lesion of back    Erythematous areas with flaking -on L back  Of concern because not healing for over a year Ref to derm      Relevant Orders   Ambulatory referral to Dermatology     Other   Estrogen deficiency   Relevant Orders   DG Bone Density   Hyperlipidemia    Good profile on lipitor and diet Disc goals for lipids and reasons to control them Rev labs with pt Rev low sat fat diet in detail       Relevant Medications   atorvastatin (LIPITOR) 20 MG tablet   Routine general medical examination at a health care facility - Primary    Reviewed health habits including diet and exercise and skin cancer prevention Reviewed appropriate screening tests for age  Also reviewed health mt list, fam hx and immunization status , as well as social and family history   See HPI Labs reviewed Your mammogram is due in  June  prevnar vaccine today  Stop at check out for referral to dermatology and also for bone density test  Labs look good Take care of yourself Ride the bike for exercise        Other Visit Diagnoses    Need for vaccination with 13-polyvalent pneumococcal conjugate vaccine        Relevant Orders    Pneumococcal conjugate vaccine 13-valent (Completed)

## 2015-06-04 NOTE — Patient Instructions (Signed)
Your mammogram is due in June  prevnar vaccine today  Stop at check out for referral to dermatology and also for bone density test  Labs look good Take care of yourself Ride the bike for exercise

## 2015-06-04 NOTE — Progress Notes (Signed)
Pre visit review using our clinic review tool, if applicable. No additional management support is needed unless otherwise documented below in the visit note. 

## 2015-06-05 NOTE — Assessment & Plan Note (Signed)
Ref for dexa  Disc need for calcium/ vitamin D/ wt bearing exercise and bone density test every 2 y to monitor Disc safety/ fracture risk in detail   

## 2015-06-05 NOTE — Assessment & Plan Note (Signed)
Reviewed health habits including diet and exercise and skin cancer prevention Reviewed appropriate screening tests for age  Also reviewed health mt list, fam hx and immunization status , as well as social and family history   See HPI Labs reviewed Your mammogram is due in June  prevnar vaccine today  Stop at check out for referral to dermatology and also for bone density test  Labs look good Take care of yourself Ride the bike for exercise

## 2015-06-05 NOTE — Assessment & Plan Note (Signed)
Erythematous areas with flaking -on L back  Of concern because not healing for over a year Ref to derm

## 2015-06-05 NOTE — Assessment & Plan Note (Signed)
Good profile on lipitor and diet Disc goals for lipids and reasons to control them Rev labs with pt Rev low sat fat diet in detail

## 2015-06-18 ENCOUNTER — Ambulatory Visit (INDEPENDENT_AMBULATORY_CARE_PROVIDER_SITE_OTHER): Payer: Medicare HMO

## 2015-06-18 VITALS — BP 122/80 | HR 82 | Temp 98.0°F | Ht 65.5 in | Wt 140.5 lb

## 2015-06-18 DIAGNOSIS — Z1159 Encounter for screening for other viral diseases: Secondary | ICD-10-CM | POA: Diagnosis not present

## 2015-06-18 DIAGNOSIS — Z0000 Encounter for general adult medical examination without abnormal findings: Secondary | ICD-10-CM

## 2015-06-18 NOTE — Progress Notes (Signed)
PCP notes:  Health maintenance: Hep C screening completed.

## 2015-06-18 NOTE — Patient Instructions (Signed)
Ms. Holey , Thank you for taking time to come for your Medicare Wellness Visit. I appreciate your ongoing commitment to your health goals. Please review the following plan we discussed and let me know if I can assist you in the future.   These are the goals we discussed: Goals    None      This is a list of the screening recommended for you and due dates:  Health Maintenance  Topic Date Due  .  Hepatitis C: One time screening is recommended by Center for Disease Control  (CDC) for  adults born from [birth year] through 1964.   Completed  . Flu Shot  09/10/2015  . Mammogram  07/19/2016  . Tetanus Vaccine  03/20/2020  . Colon Cancer Screening  12/18/2024  . DEXA scan (bone density measurement)  Completed  . Shingles Vaccine  Completed  . Pneumonia vaccines  Completed  *Topic was postponed. The date shown is not the original due date.   Preventive Care for Adults  A healthy lifestyle and preventive care can promote health and wellness. Preventive health guidelines for adults include the following key practices.  . A routine yearly physical is a good way to check with your health care provider about your health and preventive screening. It is a chance to share any concerns and updates on your health and to receive a thorough exam.  . Visit your dentist for a routine exam and preventive care every 6 months. Brush your teeth twice a day and floss once a day. Good oral hygiene prevents tooth decay and gum disease.  . The frequency of eye exams is based on your age, health, family medical history, use  of contact lenses, and other factors. Follow your health care provider's ecommendations for frequency of eye exams.  . Eat a healthy diet. Foods like vegetables, fruits, whole grains, low-fat dairy products, and lean protein foods contain the nutrients you need without too many calories. Decrease your intake of foods high in solid fats, added sugars, and salt. Eat the right amount of calories for  you. Get information about a proper diet from your health care provider, if necessary.  . Regular physical exercise is one of the most important things you can do for your health. Most adults should get at least 150 minutes of moderate-intensity exercise (any activity that increases your heart rate and causes you to sweat) each week. In addition, most adults need muscle-strengthening exercises on 2 or more days a week.  Silver Sneakers may be a benefit available to you. To determine eligibility, you may visit the website: www.silversneakers.com or contact program at (629) 328-0976 Mon-Fri between 8AM-8PM.   . Maintain a healthy weight. The body mass index (BMI) is a screening tool to identify possible weight problems. It provides an estimate of body fat based on height and weight. Your health care provider can find your BMI and can help you achieve or maintain a healthy weight.   For adults 20 years and older: ? A BMI below 18.5 is considered underweight. ? A BMI of 18.5 to 24.9 is normal. ? A BMI of 25 to 29.9 is considered overweight. ? A BMI of 30 and above is considered obese.   . Maintain normal blood lipids and cholesterol levels by exercising and minimizing your intake of saturated fat. Eat a balanced diet with plenty of fruit and vegetables. Blood tests for lipids and cholesterol should begin at age 82 and be repeated every 5 years. If your lipid or  cholesterol levels are high, you are over 50, or you are at high risk for heart disease, you may need your cholesterol levels checked more frequently. Ongoing high lipid and cholesterol levels should be treated with medicines if diet and exercise are not working.  . If you smoke, find out from your health care provider how to quit. If you do not use tobacco, please do not start.  . If you choose to drink alcohol, please do not consume more than 2 drinks per day. One drink is considered to be 12 ounces (355 mL) of beer, 5 ounces (148 mL) of  wine, or 1.5 ounces (44 mL) of liquor.  . If you are 27-17 years old, ask your health care provider if you should take aspirin to prevent strokes.  . Use sunscreen. Apply sunscreen liberally and repeatedly throughout the day. You should seek shade when your shadow is shorter than you. Protect yourself by wearing long sleeves, pants, a wide-brimmed hat, and sunglasses year round, whenever you are outdoors.  . Once a month, do a whole body skin exam, using a mirror to look at the skin on your back. Tell your health care provider of new moles, moles that have irregular borders, moles that are larger than a pencil eraser, or moles that have changed in shape or color.

## 2015-06-18 NOTE — Progress Notes (Signed)
Subjective:   Tabitha Carlson is a 70 y.o. female who presents for Medicare Annual (Subsequent) preventive examination.  Review of Systems:  N/A  Cardiac Risk Factors include: advanced age (>91men, >69 women);dyslipidemia     Objective:     Vitals: BP 122/80 mmHg  Pulse 82  Temp(Src) 98 F (36.7 C) (Oral)  Ht 5' 5.5" (1.664 m)  Wt 140 lb 8 oz (63.73 kg)  BMI 23.02 kg/m2  SpO2 97%  Body mass index is 23.02 kg/(m^2).   Tobacco History  Smoking status  . Never Smoker   Smokeless tobacco  . Never Used     Counseling given: Not Answered   Past Medical History  Diagnosis Date  . Allergic rhinitis   . Family history of ischemic heart disease   . Hyperlipidemia   . GERD (gastroesophageal reflux disease)   . Gastroparesis   . IBS (irritable bowel syndrome)   . Hx of colonic polyps   . DJD (degenerative joint disease)   . Anxiety   . Blood donor   . Hypercholesteremia   . Arthritis     in back. Physical Therapy 2016  . Depression    Past Surgical History  Procedure Laterality Date  . Left breast biopsy      benign x 2  . Breast biopsy  1980, 1983    Benign Breast Biospy x 2  . Cyst excision  04/2011    Thigh  . Abdominal hysterectomy  1985    TAH/BSO Age 19 DUB, pelvic pain   Family History  Problem Relation Age of Onset  . Leukemia Mother   . Colon cancer Neg Hx   . Hyperlipidemia Sister   . Other Sister     2 cardiac episodes   History  Sexual Activity  . Sexual Activity:  . Partners: Male  . Birth Control/ Protection: Surgical    Comment: Hysterectomy    Outpatient Encounter Prescriptions as of 06/18/2015  Medication Sig  . acetaminophen (TYLENOL) 325 MG tablet Take 650 mg by mouth every 6 (six) hours as needed.  . ALPRAZolam (XANAX) 0.5 MG tablet TAKE 1 TABLET BY MOUTH THREE TIMES DAILY AS NEEDED  . atorvastatin (LIPITOR) 20 MG tablet TAKE 1 TABLET (20 MG TOTAL) BY MOUTH DAILY.  . calcium gluconate 500 MG tablet Take 500 mg by mouth daily.  [name] Sodium 30-100 MG CAPS Take 2 capsules by mouth daily.  . Cholecalciferol (VITAMIN D) 2000 UNITS CAPS Take 1 capsule by mouth daily.  . Fish Oil-Krill Oil CAPS Take 1 capsule by mouth daily.  . Multiple Vitamins-Minerals (MULTIPLE VITAMINS/WOMENS PO) Take 1 tablet by mouth daily.  Marland Kitchen OVER THE COUNTER MEDICATION Fiber gummies  . pantoprazole (PROTONIX) 40 MG tablet TAKE 1 TABLET BY MOUTH DAILY  . promethazine (PHENERGAN) 25 MG tablet TAKE 1/2 TO 1 TABLET BY MOUTH EVERY 4 HOURS AS NEEDED FOR NAUSEA   No facility-administered encounter medications on file as of 06/18/2015.    Activities of Daily Living In your present state of health, do you have any difficulty performing the following activities: 06/18/2015  Hearing? N  Vision? N  Difficulty concentrating or making decisions? N  Walking or climbing stairs? N  Dressing or bathing? N  Doing errands, shopping? N  Preparing Food and eating ? N  Using the Toilet? N  In the past six months, have you accidently leaked urine? N  Do you have problems with loss of bowel control? N  Managing your Medications? N  Managing your Finances? N  Housekeeping or managing your Housekeeping? N    Patient Care Team: Abner Greenspan, MD as PCP - General (Family Medicine) Ralene Bathe, MD as Consulting Physician (Ophthalmology) Regina Eck, CNM as Referring Physician (Certified Nurse Midwife)    Assessment:     Hearing Screening   125Hz  250Hz  500Hz  1000Hz  2000Hz  4000Hz  8000Hz   Right ear:   40 40 40 40   Left ear:   40 40 40 40   Vision Screening Comments: Last eye exam in Sept 2016 with Dr. Claudean Kinds   Exercise Activities and Dietary recommendations Current Exercise Habits: Structured exercise class;Home exercise routine, Type of exercise: Other - see comments;walking (Silver Sneakers), Time (Minutes): 60, Frequency (Times/Week): 5, Weekly Exercise (Minutes/Week): 300, Intensity: Moderate, Exercise limited by: None  identified  Goals    None     Fall Risk Fall Risk  06/18/2015 06/04/2015 05/18/2014 05/08/2013 05/03/2012  Falls in the past year? Yes Yes No No No  Number falls in past yr: 1 1 - - -  Injury with Fall? Yes Yes - - -  Risk Factor Category  - High Fall Risk - - -  Follow up Falls evaluation completed - - - -   Depression Screen PHQ 2/9 Scores 06/18/2015 06/04/2015 05/18/2014 05/08/2013  PHQ - 2 Score 0 0 0 0     Cognitive Testing MMSE - Mini Mental State Exam 06/18/2015  Orientation to time 5  Orientation to Place 5  Registration 3  Attention/ Calculation 0  Recall 3  Language- name 2 objects 0  Language- repeat 1  Language- follow 3 step command 3  Language- read & follow direction 0  Write a sentence 0  Copy design 0  Total score 20   PLEASE NOTE: A Mini-Cog screen was completed. Maximum score is 20. A value of 0 denotes this part of Folstein MMSE was not completed or the patient failed this part of the Mini-Cog screening.   Mini-Cog Screening Orientation to Time - Max 5 pts Orientation to Place - Max 5 pts Registration - Max 3 pts Recall - Max 3 pts Language Repeat - Max 1 pts Language Follow 3 Step Command - Max 3 pts   Immunization History  Administered Date(s) Administered  . Influenza Split 11/01/2010, 10/23/2011  . Influenza Whole 10/10/2009  . Influenza, High Dose Seasonal PF 10/23/2014  . Influenza,inj,Quad PF,36+ Mos 10/26/2012, 10/20/2013  . Pneumococcal Conjugate-13 06/04/2015  . Pneumococcal Polysaccharide-23 10/20/2013  . Td 03/04/2000, 03/20/2010  . Zoster 12/11/2014   Screening Tests Health Maintenance  Topic Date Due  . INFLUENZA VACCINE  09/10/2015  . MAMMOGRAM  07/19/2016  . TETANUS/TDAP  03/20/2020  . COLONOSCOPY  12/18/2024  . DEXA SCAN  Completed  . ZOSTAVAX  Completed  . Hepatitis C Screening  Completed  . PNA vac Low Risk Adult  Completed      Plan:     I have personally reviewed and addressed the Medicare Annual Wellness questionnaire  and have noted the following in the patient's chart:  A. Medical and social history B. Use of alcohol, tobacco or illicit drugs  C. Current medications and supplements D. Functional ability and status E.  Nutritional status F.  Physical activity G. Advance directives H. List of other physicians I.  Hospitalizations, surgeries, and ER visits in previous 12 months J.  [place] to include hearing, vision, cognitive, depression L. Referrals and appointments - none  In addition, I have reviewed and discussed with  patient certain preventive protocols, quality metrics, and best practice recommendations. A written personalized care plan for preventive services as well as general preventive health recommendations were provided to patient.  See attached scanned questionnaire for additional information.   Signed,   Lindell Noe, MHA, BS, LPN Health Advisor [phone]

## 2015-06-18 NOTE — Progress Notes (Signed)
Pre visit review using our clinic review tool, if applicable. No additional management support is needed unless otherwise documented below in the visit note. 

## 2015-06-18 NOTE — Progress Notes (Signed)
   Subjective:    Patient ID: Tabitha Carlson, female    DOB: [birth year], 70 y.o.   MRN: [id number]  HPI    Review of Systems     Objective:   Physical Exam        Assessment & Plan:  I reviewed health advisor's note, was available for consultation, and agree with documentation and plan.

## 2015-06-19 LAB — HEPATITIS C ANTIBODY: HCV AB: NEGATIVE

## 2015-06-21 ENCOUNTER — Encounter: Payer: Self-pay | Admitting: Adult Health

## 2015-06-21 ENCOUNTER — Ambulatory Visit (INDEPENDENT_AMBULATORY_CARE_PROVIDER_SITE_OTHER): Payer: Medicare HMO | Admitting: Adult Health

## 2015-06-21 ENCOUNTER — Telehealth: Payer: Self-pay | Admitting: Family Medicine

## 2015-06-21 VITALS — BP 140/82 | Temp 97.4°F | Ht 65.5 in | Wt 138.8 lb

## 2015-06-21 DIAGNOSIS — R002 Palpitations: Secondary | ICD-10-CM | POA: Diagnosis not present

## 2015-06-21 DIAGNOSIS — R079 Chest pain, unspecified: Secondary | ICD-10-CM

## 2015-06-21 LAB — BASIC METABOLIC PANEL
BUN: 14 mg/dL (ref 6–23)
CALCIUM: 10.2 mg/dL (ref 8.4–10.5)
CHLORIDE: 100 meq/L (ref 96–112)
CO2: 28 meq/L (ref 19–32)
CREATININE: 0.99 mg/dL (ref 0.40–1.20)
GFR: 59.21 mL/min — ABNORMAL LOW (ref >60.00)
Glucose, Bld: 122 mg/dL — ABNORMAL HIGH (ref 70–99)
Potassium: 4.1 mEq/L (ref 3.5–5.1)
SODIUM: 138 meq/L (ref 135–145)

## 2015-06-21 LAB — CBC WITH DIFFERENTIAL/PLATELET
BASOS ABS: 0 10*3/uL (ref 0.0–0.1)
BASOS PCT: 0.4 % (ref 0.0–3.0)
Eosinophils Absolute: 0 10*3/uL (ref 0.0–0.7)
Eosinophils Relative: 0.5 % (ref 0.0–5.0)
HCT: 41 % (ref 36.0–46.0)
Hemoglobin: 13.7 g/dL (ref 12.0–15.0)
LYMPHS ABS: 1.2 10*3/uL (ref 0.7–4.0)
LYMPHS PCT: 19.2 % (ref 12.0–46.0)
MCHC: 33.5 g/dL (ref 30.0–36.0)
MCV: 90.3 fl (ref 78.0–100.0)
MONO ABS: 0.4 10*3/uL (ref 0.1–1.0)
Monocytes Relative: 6.5 % (ref 3.0–12.0)
NEUTROS ABS: 4.7 10*3/uL (ref 1.4–7.7)
NEUTROS PCT: 73.4 % (ref 43.0–77.0)
PLATELETS: 220 10*3/uL (ref 150.0–400.0)
RBC: 4.54 Mil/uL (ref 3.87–5.11)
RDW: 12.8 % (ref 11.5–15.5)
WBC: 6.4 10*3/uL (ref 4.0–10.5)

## 2015-06-21 NOTE — Patient Instructions (Addendum)
It was great seeing you today!  I will follow up with you regarding the blood work   Someone will call you to schedule your stress test and holter monitor.   Follow up with PCP   If you continue to have chest pain over the weekend, please go to the ER

## 2015-06-21 NOTE — Progress Notes (Addendum)
Subjective:    Patient ID: Tabitha Carlson, female    DOB: [birth year], 70 y.o.   MRN: [id number]  HPI  70 year old female, patient of Dr. Glori Bickers, who I am seeing in the office today for periodic episodes of chest pain and a feeling of fast heart rate or palpitations.  She reports that she was seen recently for her annual wellness exam. Her chest pain started after her wellness exam. She has had chest pain on and off for the last three days. Two days ago she had pain while exercising, yesterday she had pain while at rest. The pain is under her left breast. Does not radiate and is described as a " twinge" .   Labs(CBC, CMP, Lipid, TSH)  from 05/21/2015 were all WNL  She reports indigestion and nausea during some of the episodes. She takes Protonix.   Currently not having any chest discomfort.   She has a family history of cardiac issues with her sister.   Review of Systems  HENT: Negative.   Respiratory: Negative for shortness of breath and wheezing.   Cardiovascular: Positive for chest pain and palpitations. Negative for leg swelling.  Genitourinary: Negative.   Musculoskeletal: Negative.   Neurological: Negative.   Psychiatric/Behavioral: Negative.   All other systems reviewed and are negative.  Past Medical History  Diagnosis Date  . Allergic rhinitis   . Family history of ischemic heart disease   . Hyperlipidemia   . GERD (gastroesophageal reflux disease)   . Gastroparesis   . IBS (irritable bowel syndrome)   . Hx of colonic polyps   . DJD (degenerative joint disease)   . Anxiety   . Blood donor   . Hypercholesteremia   . Arthritis     in back. Physical Therapy 2016  . Depression     Social History   Social History  . Marital Status: Married    Spouse Name: Printmaker  . Number of Children: 2  . Years of Education: N/A   Occupational History  . housewife    Social History Main Topics  . Smoking status: Never Smoker   . Smokeless tobacco: Never Used  .  Alcohol Use: No  . Drug Use: No  . Sexual Activity:    Partners: Male    Birth Control/ Protection: Surgical     Comment: Hysterectomy   Other Topics Concern  . Not on file   Social History Narrative    Past Surgical History  Procedure Laterality Date  . Left breast biopsy      benign x 2  . Breast biopsy  1980, 1983    Benign Breast Biospy x 2  . Cyst excision  04/2011    Thigh  . Abdominal hysterectomy  1985    TAH/BSO Age 45 DUB, pelvic pain    Family History  Problem Relation Age of Onset  . Leukemia Mother   . Colon cancer Neg Hx   . Hyperlipidemia Sister   . Other Sister     2 cardiac episodes    Allergies  Allergen Reactions  . Aspirin     Unable to take a full aspirin--causes palpitations Tachycardia  . Crab [Shellfish Allergy]     Rash from head to toe    Current Outpatient Prescriptions on File Prior to Visit  Medication Sig Dispense Refill  . acetaminophen (TYLENOL) 325 MG tablet Take 650 mg by mouth every 6 (six) hours as needed.    . ALPRAZolam (XANAX) 0.5 MG tablet  TAKE 1 TABLET BY MOUTH THREE TIMES DAILY AS NEEDED 90 tablet 1  . atorvastatin (LIPITOR) 20 MG tablet TAKE 1 TABLET (20 MG TOTAL) BY MOUTH DAILY. 90 tablet 3  . calcium gluconate 500 MG tablet Take 500 mg by mouth daily.    [name] Sodium 30-100 MG CAPS Take 2 capsules by mouth daily.    . Cholecalciferol (VITAMIN D) 2000 UNITS CAPS Take 1 capsule by mouth daily.    . Fish Oil-Krill Oil CAPS Take 1 capsule by mouth daily.    . Multiple Vitamins-Minerals (MULTIPLE VITAMINS/WOMENS PO) Take 1 tablet by mouth daily.    Marland Kitchen OVER THE COUNTER MEDICATION Fiber gummies    . pantoprazole (PROTONIX) 40 MG tablet TAKE 1 TABLET BY MOUTH DAILY 90 tablet 3  . promethazine (PHENERGAN) 25 MG tablet TAKE 1/2 TO 1 TABLET BY MOUTH EVERY 4 HOURS AS NEEDED FOR NAUSEA 30 tablet 5   No current facility-administered medications on file prior to visit.    BP 140/82 mmHg  Temp(Src) 97.4 F (36.3  C) (Oral)  Ht 5' 5.5" (1.664 m)  Wt 138 lb 12.8 oz (62.959 kg)  BMI 22.74 kg/m2       Objective:   Physical Exam  Constitutional: She appears well-developed and well-nourished. No distress.  Cardiovascular: Normal rate, regular rhythm, S1 normal, S2 normal, normal heart sounds and intact distal pulses.  Exam reveals no gallop, no distant heart sounds and no friction rub.   No murmur heard. Pulmonary/Chest: Effort normal and breath sounds normal. No respiratory distress. She has no wheezes. She has no rales. She exhibits no tenderness.  Skin: She is not diaphoretic.  Nursing note and vitals reviewed.     Assessment & Plan:  1. Chest pain, unspecified chest pain type  - EKG 12-Lead- SR, nonspecific t wave abnormality ( not apparent). Rate 89 - CBC with Differential/Platelet - Basic metabolic panel - Holter monitor - 48 hour; Future - Exercise Tolerance Test; Future - Follow up with PCP as soon as possible   2. Palpitations - EKG 12-Lead - CBC with Differential/Platelet - Basic metabolic panel - Holter monitor - 48 hour; Future - Exercise Tolerance Test; Future   Dorothyann Peng, NP

## 2015-06-21 NOTE — Telephone Encounter (Signed)
Pt has appt 06/21/15 at 11 Am with Dorothyann Peng NP.

## 2015-06-21 NOTE — Telephone Encounter (Signed)
Patient Name: Tabitha Carlson DOB: 12/26/1945 Initial Comment Caller states she was seen Tuesday for wellness check. Started after with chest pain that would come and go. Happened again multiple times since then. Nurse Assessment Nurse: Ronnald Ramp, RN, Miranda Date/Time (Eastern Time): 06/21/2015 8:40:46 AM Confirm and document reason for call. If symptomatic, describe symptoms. You must click the next button to save text entered. ---Caller states she has had chest pain off an on since Tuesday evening. Did have pain on Wednesday during exercise but she continued to exercise. Yesterday she had pain but while at rest. The pain last a few seconds at a time. Not having pain now. Is having some nausea. Has the patient traveled out of the country within the last 30 days? ---Not Applicable Does the patient have any new or worsening symptoms? ---Yes Will a triage be completed? ---Yes Related visit to physician within the last 2 weeks? ---No Does the PT have any chronic conditions? (i.e. diabetes, asthma, etc.) ---Yes List chronic conditions. ---High Cholesterol, Anxiety, Arthritis, GERD, Osteoporosis Is this a behavioral health or substance abuse call? ---No Guidelines Guideline Title Affirmed Question Affirmed Notes Chest Pain [1] Chest pain lasting <= 5 minutes AND [2] NO chest pain or cardiac symptoms now (Exceptions: pains lasting a few seconds) Final Disposition User See Physician within 24 Hours Jones, Therapist, sports, Miranda Comments No appt available with PCP or at primary office within recommended time frame. Appt scheduled for 11am at [place] with Sallee Provencal, NP Referrals GO TO FACILITY OTHER - SPECIFY Disagree/Comply: Comply

## 2015-07-02 ENCOUNTER — Other Ambulatory Visit: Payer: Self-pay | Admitting: Family Medicine

## 2015-07-02 NOTE — Telephone Encounter (Signed)
Px written for call in   

## 2015-07-02 NOTE — Telephone Encounter (Signed)
Pt had her CPE on 06/04/15, and had her AWV with Lattie Haw too, and has her next yr appts scheduled too. Last refilled on 04/01/15 #90 with 1 additional refills, please advise

## 2015-07-02 NOTE — Telephone Encounter (Signed)
Rx called in as prescribed 

## 2015-07-03 DIAGNOSIS — L821 Other seborrheic keratosis: Secondary | ICD-10-CM | POA: Diagnosis not present

## 2015-07-03 DIAGNOSIS — C44519 Basal cell carcinoma of skin of other part of trunk: Secondary | ICD-10-CM | POA: Diagnosis not present

## 2015-07-03 DIAGNOSIS — D485 Neoplasm of uncertain behavior of skin: Secondary | ICD-10-CM | POA: Diagnosis not present

## 2015-07-05 ENCOUNTER — Ambulatory Visit (INDEPENDENT_AMBULATORY_CARE_PROVIDER_SITE_OTHER): Payer: Medicare HMO

## 2015-07-05 DIAGNOSIS — R079 Chest pain, unspecified: Secondary | ICD-10-CM

## 2015-07-05 DIAGNOSIS — R002 Palpitations: Secondary | ICD-10-CM

## 2015-07-05 LAB — EXERCISE TOLERANCE TEST
CHL CUP RESTING HR STRESS: 90 {beats}/min
CHL CUP STRESS STAGE 1 HR: 97 {beats}/min
CHL CUP STRESS STAGE 2 GRADE: 0 %
CHL CUP STRESS STAGE 2 SPEED: 1 mph
CHL CUP STRESS STAGE 3 GRADE: 0.2 %
CHL CUP STRESS STAGE 3 SPEED: 1 mph
CHL CUP STRESS STAGE 4 DBP: 85 mmHg
CHL CUP STRESS STAGE 4 GRADE: 10 %
CHL CUP STRESS STAGE 5 DBP: 82 mmHg
CHL CUP STRESS STAGE 5 SBP: 172 mmHg
CHL CUP STRESS STAGE 5 SPEED: 2.5 mph
CHL CUP STRESS STAGE 6 SPEED: 3.4 mph
CHL CUP STRESS STAGE 8 GRADE: 0 %
CHL CUP STRESS STAGE 8 SPEED: 0 mph
CHL RATE OF PERCEIVED EXERTION: 17
CSEPED: 8 min
CSEPEDS: 0 s
CSEPEW: 10 METS
CSEPHR: 97 %
CSEPPHR: 146 {beats}/min
MPHR: 152 {beats}/min
Percent of predicted max HR: 96 %
Stage 1 DBP: 85 mmHg
Stage 1 Grade: 0 %
Stage 1 SBP: 150 mmHg
Stage 1 Speed: 0 mph
Stage 2 HR: 100 {beats}/min
Stage 3 HR: 101 {beats}/min
Stage 4 HR: 112 {beats}/min
Stage 4 SBP: 158 mmHg
Stage 4 Speed: 1.7 mph
Stage 5 Grade: 12 %
Stage 5 HR: 127 {beats}/min
Stage 6 Grade: 14 %
Stage 6 HR: 146 {beats}/min
Stage 7 DBP: 78 mmHg
Stage 7 Grade: 0 %
Stage 7 HR: 114 {beats}/min
Stage 7 SBP: 165 mmHg
Stage 7 Speed: 0 mph
Stage 8 DBP: 88 mmHg
Stage 8 HR: 88 {beats}/min
Stage 8 SBP: 152 mmHg

## 2015-07-10 ENCOUNTER — Other Ambulatory Visit: Payer: Self-pay | Admitting: Adult Health

## 2015-07-10 DIAGNOSIS — R002 Palpitations: Secondary | ICD-10-CM

## 2015-07-10 DIAGNOSIS — R079 Chest pain, unspecified: Secondary | ICD-10-CM

## 2015-07-22 ENCOUNTER — Ambulatory Visit
Admission: RE | Admit: 2015-07-22 | Discharge: 2015-07-22 | Disposition: A | Discharge status: Home / Self Care | Payer: Medicare HMO | Source: Ambulatory Visit | Attending: Family Medicine | Admitting: Family Medicine

## 2015-07-22 DIAGNOSIS — M85852 Other specified disorders of bone density and structure, left thigh: Secondary | ICD-10-CM | POA: Diagnosis not present

## 2015-07-22 DIAGNOSIS — Z1231 Encounter for screening mammogram for malignant neoplasm of breast: Secondary | ICD-10-CM

## 2015-07-22 DIAGNOSIS — Z780 Asymptomatic menopausal state: Secondary | ICD-10-CM | POA: Diagnosis not present

## 2015-07-22 DIAGNOSIS — E2839 Other primary ovarian failure: Secondary | ICD-10-CM

## 2015-08-02 ENCOUNTER — Telehealth: Payer: Self-pay | Admitting: Family Medicine

## 2015-08-02 NOTE — Telephone Encounter (Signed)
Called pt back & informed her of her results.

## 2015-08-02 NOTE — Telephone Encounter (Signed)
Pt returned your call in reference to her heart monitor results.

## 2015-08-20 DIAGNOSIS — Z0101 Encounter for examination of eyes and vision with abnormal findings: Secondary | ICD-10-CM | POA: Diagnosis not present

## 2015-10-15 ENCOUNTER — Other Ambulatory Visit: Payer: Self-pay | Admitting: Family Medicine

## 2015-10-15 NOTE — Telephone Encounter (Signed)
Px written for call in   

## 2015-10-15 NOTE — Telephone Encounter (Signed)
Rx called in as prescribed 

## 2015-10-15 NOTE — Telephone Encounter (Signed)
Pt had CPE on 06/04/15 and has her next years scheduled, last filled on 07/02/15 #90 with 1 additional refill, please advise

## 2015-10-30 DIAGNOSIS — Z85828 Personal history of other malignant neoplasm of skin: Secondary | ICD-10-CM | POA: Diagnosis not present

## 2015-10-30 DIAGNOSIS — L910 Hypertrophic scar: Secondary | ICD-10-CM | POA: Diagnosis not present

## 2015-11-14 DIAGNOSIS — R69 Illness, unspecified: Secondary | ICD-10-CM | POA: Diagnosis not present

## 2015-11-21 DIAGNOSIS — H5213 Myopia, bilateral: Secondary | ICD-10-CM | POA: Diagnosis not present

## 2015-11-21 DIAGNOSIS — Z0101 Encounter for examination of eyes and vision with abnormal findings: Secondary | ICD-10-CM | POA: Diagnosis not present

## 2015-11-27 DIAGNOSIS — L918 Other hypertrophic disorders of the skin: Secondary | ICD-10-CM | POA: Diagnosis not present

## 2015-11-27 DIAGNOSIS — D2272 Melanocytic nevi of left lower limb, including hip: Secondary | ICD-10-CM | POA: Diagnosis not present

## 2015-11-27 DIAGNOSIS — D2262 Melanocytic nevi of left upper limb, including shoulder: Secondary | ICD-10-CM | POA: Diagnosis not present

## 2015-11-27 DIAGNOSIS — L821 Other seborrheic keratosis: Secondary | ICD-10-CM | POA: Diagnosis not present

## 2015-11-27 DIAGNOSIS — D2271 Melanocytic nevi of right lower limb, including hip: Secondary | ICD-10-CM | POA: Diagnosis not present

## 2015-11-27 DIAGNOSIS — L814 Other melanin hyperpigmentation: Secondary | ICD-10-CM | POA: Diagnosis not present

## 2015-11-27 DIAGNOSIS — D2261 Melanocytic nevi of right upper limb, including shoulder: Secondary | ICD-10-CM | POA: Diagnosis not present

## 2015-11-27 DIAGNOSIS — L718 Other rosacea: Secondary | ICD-10-CM | POA: Diagnosis not present

## 2015-11-27 DIAGNOSIS — Z85828 Personal history of other malignant neoplasm of skin: Secondary | ICD-10-CM | POA: Diagnosis not present

## 2015-11-27 DIAGNOSIS — D225 Melanocytic nevi of trunk: Secondary | ICD-10-CM | POA: Diagnosis not present

## 2015-12-29 ENCOUNTER — Other Ambulatory Visit: Payer: Self-pay | Admitting: Family Medicine

## 2015-12-30 NOTE — Telephone Encounter (Signed)
Sorry done

## 2015-12-30 NOTE — Telephone Encounter (Signed)
You didn't approve med, please sign orders

## 2015-12-30 NOTE — Telephone Encounter (Signed)
Rx called in as prescribed 

## 2015-12-30 NOTE — Telephone Encounter (Signed)
Px written for call in   

## 2015-12-30 NOTE — Telephone Encounter (Signed)
Pt has CPE scheduled for 06/29/16, last filled on 10/15/15 #90 with 0 refills, please advise

## 2015-12-31 ENCOUNTER — Ambulatory Visit (INDEPENDENT_AMBULATORY_CARE_PROVIDER_SITE_OTHER): Payer: Medicare HMO | Admitting: Certified Nurse Midwife

## 2015-12-31 ENCOUNTER — Encounter: Payer: Self-pay | Admitting: Certified Nurse Midwife

## 2015-12-31 VITALS — BP 142/70 | HR 96 | Temp 98.6°F | Resp 14 | Ht 65.5 in | Wt 146.0 lb

## 2015-12-31 DIAGNOSIS — N3001 Acute cystitis with hematuria: Secondary | ICD-10-CM | POA: Diagnosis not present

## 2015-12-31 DIAGNOSIS — R319 Hematuria, unspecified: Secondary | ICD-10-CM

## 2015-12-31 LAB — POCT URINALYSIS DIPSTICK
Bilirubin, UA: NEGATIVE
GLUCOSE UA: NEGATIVE
Ketones, UA: NEGATIVE
Nitrite, UA: NEGATIVE
PROTEIN UA: NEGATIVE
UROBILINOGEN UA: NEGATIVE
pH, UA: 6

## 2015-12-31 MED ORDER — PHENAZOPYRIDINE HCL 100 MG PO TABS: 100.0000 mg | ORAL_TABLET | Freq: Three times a day (TID) | ORAL | 0 refills | Status: DC | PRN

## 2015-12-31 MED ORDER — SULFAMETHOXAZOLE-TRIMETHOPRIM 800-160 MG PO TABS: 1.0000 | ORAL_TABLET | Freq: Two times a day (BID) | ORAL | 0 refills | Status: DC

## 2015-12-31 NOTE — Patient Instructions (Signed)
Urinary Tract Infection, Adult Introduction A urinary tract infection (UTI) is an infection of any part of the urinary tract. The urinary tract includes the:  Kidneys.  Ureters.  Bladder.  Urethra. These organs make, store, and get rid of pee (urine) in the body. Follow these instructions at home:  Take over-the-counter and prescription medicines only as told by your doctor.  If you were prescribed an antibiotic medicine, take it as told by your doctor. Do not stop taking the antibiotic even if you start to feel better.  Avoid the following drinks:  Alcohol.  Caffeine.  Tea.  Carbonated drinks.  Drink enough fluid to keep your pee clear or pale yellow.  Keep all follow-up visits as told by your doctor. This is important.  Make sure to:  Empty your bladder often and completely. Do not to hold pee for long periods of time.  Empty your bladder before and after sex.  Wipe from front to back after a bowel movement if you are female. Use each tissue one time when you wipe. Contact a doctor if:  You have back pain.  You have a fever.  You feel sick to your stomach (nauseous).  You throw up (vomit).  Your symptoms do not get better after 3 days.  Your symptoms go away and then come back. Get help right away if:  You have very bad back pain.  You have very bad lower belly (abdominal) pain.  You are throwing up and cannot keep down any medicines or water. This information is not intended to replace advice given to you by your health care provider. Make sure you discuss any questions you have with your health care provider. Document Released: 07/15/2007 Document Revised: 07/04/2015 Document Reviewed: 12/17/2014  2017 Elsevier  

## 2015-12-31 NOTE — Progress Notes (Signed)
70 y.o. Married Caucasian female [phone] here with complaint of UTI, with onset  on 24 hours ago. Patient complaining of urinary frequency/urgency/ and pain with urination. Patient denies fever, chills, nausea or back pain. No new personal products. Patient feels not related to sexual activity. Denies any vaginal symptoms.   Menopausal with vaginal dryness. Patient has been increasing  water intake. Limited caffeine intake. Patient has only had one UTI before and feels the same. No other health issues today.   O: Healthy female WDWN Affect: Normal, orientation x 3 Skin : warm and dry CVAT: negative bilateral Abdomen: positive for suprapubic tenderness  Pelvic exam: External genital area: normal, no lesions Bladder,Urethra tender, Urethral meatus: tender, red Vagina: normal vaginal discharge, dry appearance   Cervix: Absent Uterus:absent Adnexa: normal non tender, no fullness or masses   A: UTI Normal pelvic exam Vaginal dryness  P: Reviewed findings of UTI and need for treatment. [id number] DS see order with instructions [id number] micro, culture Reviewed warning signs and symptoms of UTI and need to advise if occurring. Encouraged to limit soda, tea, and coffee and be sure to increase water intake. Discussed coconut oil use for vaginal dryness to help prevent recurrent UTI. Questions addressed.   RV prn

## 2016-01-01 ENCOUNTER — Telehealth: Payer: Self-pay

## 2016-01-01 LAB — URINALYSIS, MICROSCOPIC ONLY
BACTERIA UA: NONE SEEN [HPF]
CRYSTALS: NONE SEEN [HPF]
Casts: NONE SEEN [LPF]
RBC / HPF: NONE SEEN RBC/HPF (ref <=2)
Yeast: NONE SEEN [HPF]

## 2016-01-01 NOTE — Telephone Encounter (Signed)
-----   Message from Regina Eck, CNM sent at 01/01/2016  7:54 AM EST ----- Notify patient that urine micro showed only WBC, Culture pending Patient status?

## 2016-01-01 NOTE — Telephone Encounter (Signed)
[email or web address]

## 2016-01-01 NOTE — Telephone Encounter (Signed)
Patient notified of results. See lab 

## 2016-01-03 LAB — URINE CULTURE

## 2016-01-03 NOTE — Progress Notes (Signed)
Encounter reviewed Elianah Karis, MD   

## 2016-01-07 ENCOUNTER — Telehealth: Payer: Self-pay | Admitting: Certified Nurse Midwife

## 2016-01-07 NOTE — Telephone Encounter (Signed)
Patient was placed on Bactrim on 12/31/2015. Spoke with patient. Nurse visit for 2 week urine recheck scheduled for 01/14/2016 at 10:30 am. Patient is agreeable to date and time.  Notes Recorded by Kem Boroughs, FNP on 01/06/2016 at 5:51 PM EST Results via my chart:  Tabitha Carlson, The urine culture did show an infection and you are on the correct medication. Please make a nurse apt for 2 weeks and lets retest the urine to make sure all infection is gone. ------  Notes Recorded by Susy Manor, CMA on 01/01/2016 at 11:56 AM EST Patient notified of results as written by provider. Pt states she is feeling better today. ------  Notes Recorded by Susy Manor, CMA on 01/01/2016 at 11:37 AM EST Left message for patient to callback ------  Notes Recorded by Regina Eck, CNM on 01/01/2016 at 7:54 AM EST Notify patient that urine micro showed only WBC, Culture pending Patient status?  Cc: [name] CNM  Routing to provider for final review. Patient agreeable to disposition. Will close encounter.

## 2016-01-07 NOTE — Telephone Encounter (Signed)
Patient called says she needs to speak with a nurse about making a urine appointment in 2 wks

## 2016-01-14 ENCOUNTER — Ambulatory Visit: Payer: Medicare HMO

## 2016-01-14 VITALS — BP 130/70 | HR 76 | Resp 16 | Ht 65.5 in | Wt 146.0 lb

## 2016-01-14 DIAGNOSIS — R8290 Unspecified abnormal findings in urine: Secondary | ICD-10-CM | POA: Diagnosis not present

## 2016-01-14 NOTE — Progress Notes (Signed)
Patient here for 2 week repeat culture to recheck urine. Per patient she is feeling much better and has completed antibiotics.

## 2016-01-15 LAB — URINE CULTURE: ORGANISM ID, BACTERIA: NO GROWTH

## 2016-01-31 ENCOUNTER — Encounter: Payer: Self-pay | Admitting: Family Medicine

## 2016-02-19 DIAGNOSIS — Z0101 Encounter for examination of eyes and vision with abnormal findings: Secondary | ICD-10-CM | POA: Diagnosis not present

## 2016-02-21 ENCOUNTER — Telehealth: Payer: Self-pay | Admitting: Family Medicine

## 2016-02-21 IMAGING — CR DG ANKLE COMPLETE 3+V*L*
3 series · 3 of 3 positions shown · non-contrast
Comparison: None.

CLINICAL DATA: Left ankle injury.  Pain and swelling

EXAM:
LEFT ANKLE COMPLETE - 3+ VIEW

[view not recorded (1 of 3)]
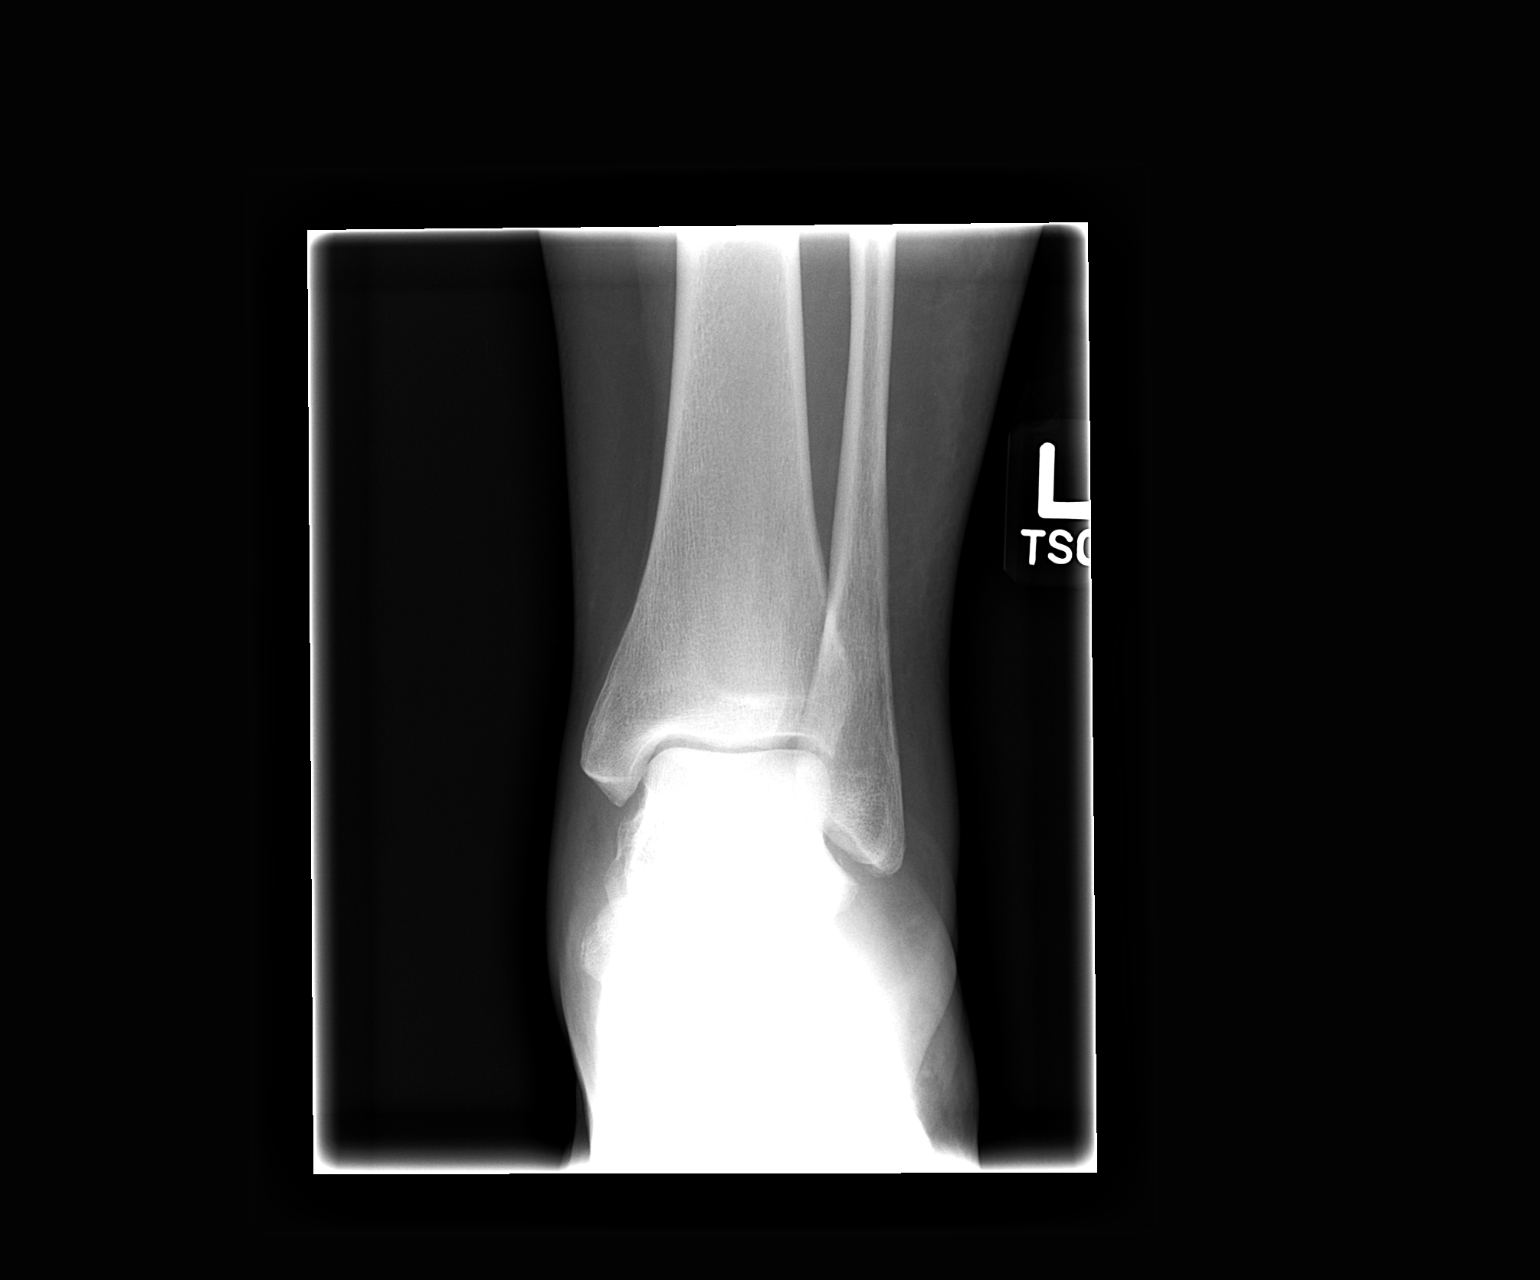

[view not recorded (2 of 3)]
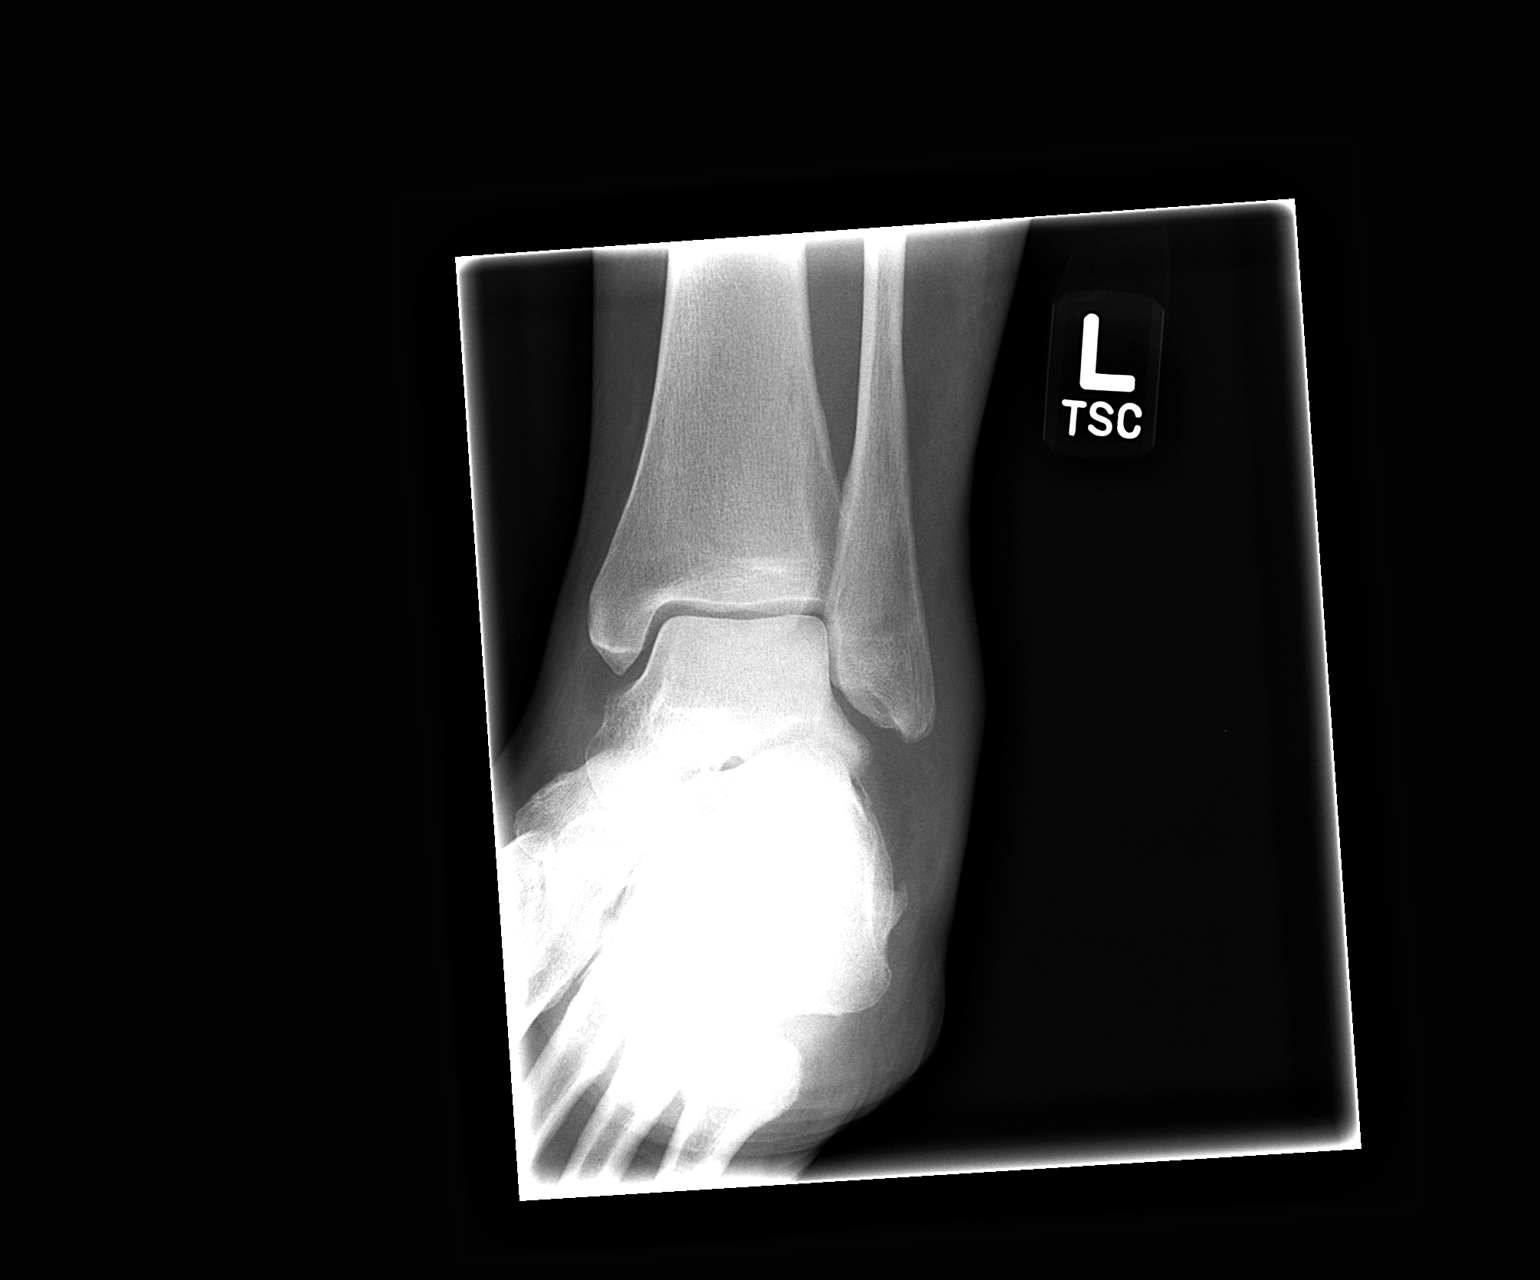

[view not recorded (3 of 3)]
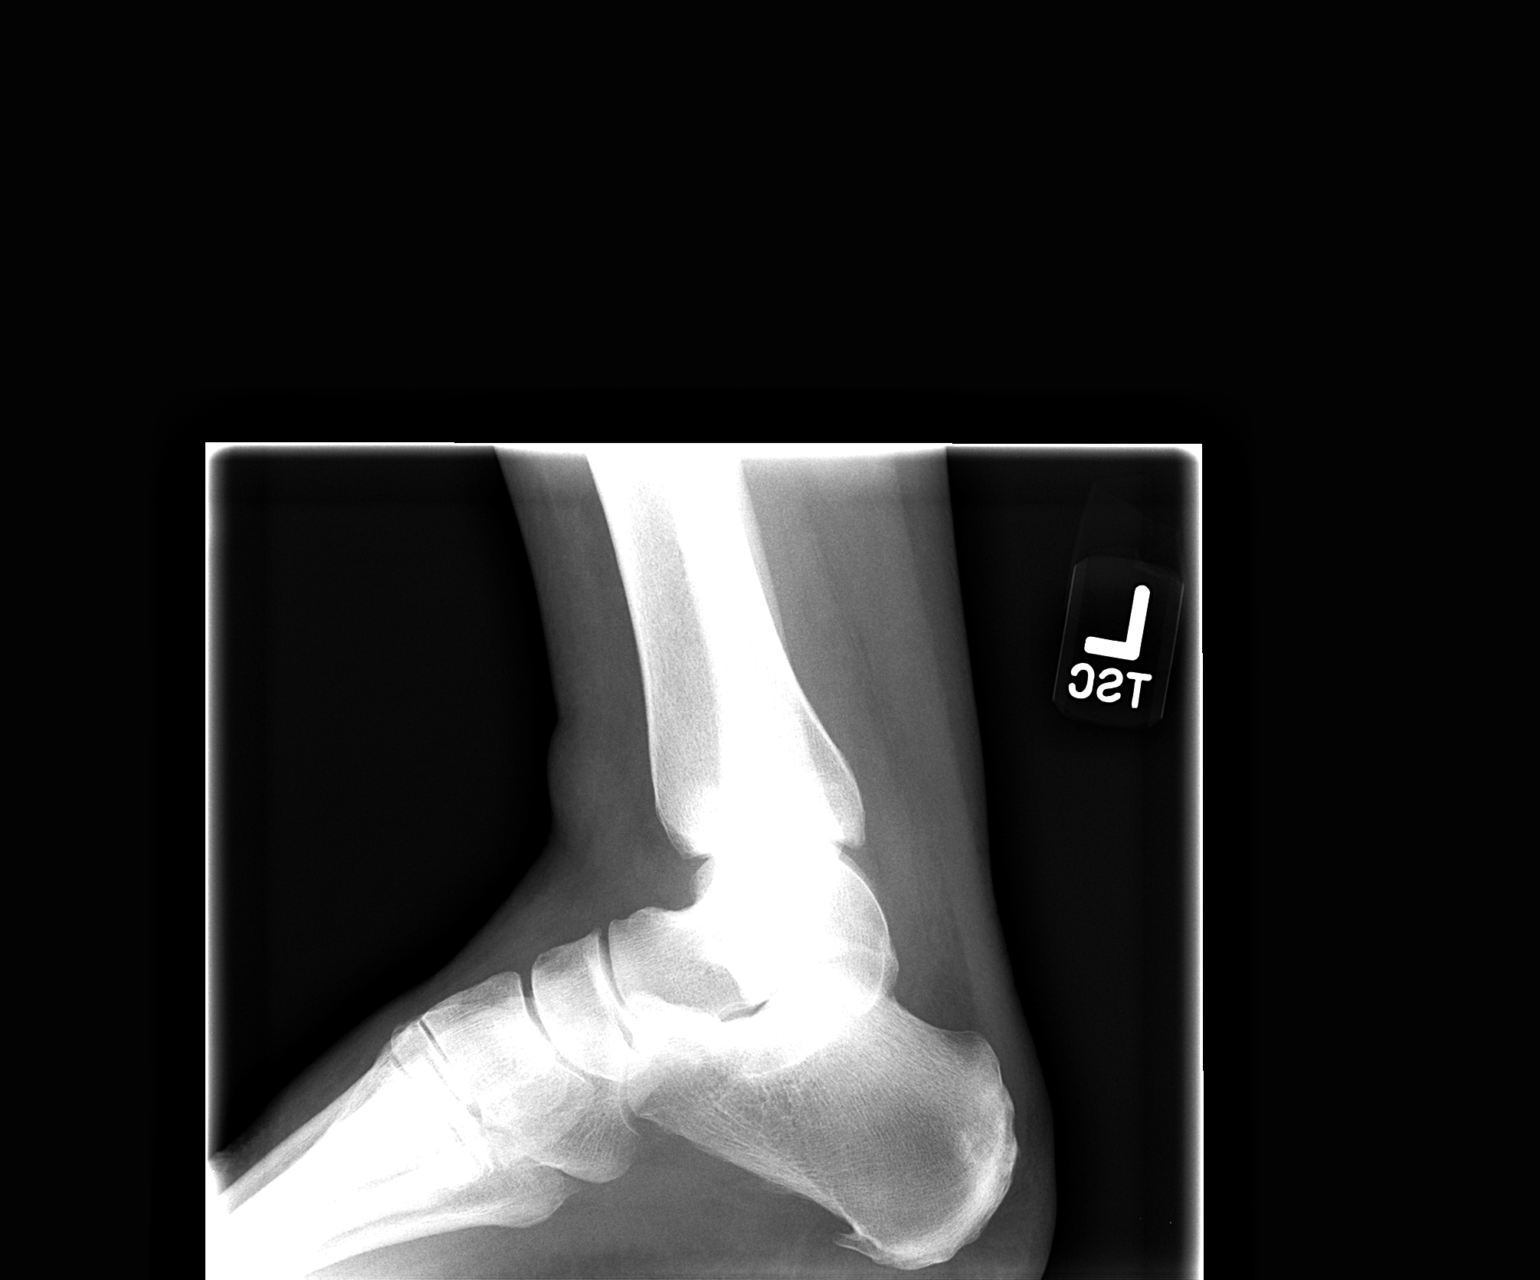

[3 of 3 positions shown; findings below may reference images not displayed]

FINDINGS: There is no evidence of fracture, dislocation, or joint effusion.
There is no evidence of arthropathy or other focal bone abnormality.
Soft tissues are unremarkable.
IMPRESSION: Negative.

## 2016-02-21 MED ORDER — OSELTAMIVIR PHOSPHATE 75 MG PO CAPS: 75.0000 mg | ORAL_CAPSULE | Freq: Every day | ORAL | 0 refills | Status: AC

## 2016-02-21 NOTE — Telephone Encounter (Signed)
Spouse with flu.. Sent in prophylaxsis.

## 2016-03-02 ENCOUNTER — Other Ambulatory Visit: Payer: Self-pay | Admitting: Family Medicine

## 2016-03-03 NOTE — Telephone Encounter (Signed)
Rx called in as prescribed 

## 2016-03-03 NOTE — Telephone Encounter (Signed)
Pt has CPE scheduled on 06/29/16 (has AWV scheduled too), last filled on 12/30/15 #90 tabs with 0 refills, please advise

## 2016-03-03 NOTE — Telephone Encounter (Signed)
Px written for call in   

## 2016-03-10 ENCOUNTER — Encounter: Payer: Self-pay | Admitting: Certified Nurse Midwife

## 2016-03-10 ENCOUNTER — Ambulatory Visit (INDEPENDENT_AMBULATORY_CARE_PROVIDER_SITE_OTHER): Payer: Medicare HMO | Admitting: Certified Nurse Midwife

## 2016-03-10 VITALS — BP 140/72 | HR 70 | Temp 97.6°F | Resp 16 | Ht 65.25 in | Wt 145.0 lb

## 2016-03-10 DIAGNOSIS — Z01419 Encounter for gynecological examination (general) (routine) without abnormal findings: Secondary | ICD-10-CM

## 2016-03-10 DIAGNOSIS — Z0000 Encounter for general adult medical examination without abnormal findings: Secondary | ICD-10-CM | POA: Diagnosis not present

## 2016-03-10 LAB — POCT URINALYSIS DIPSTICK
BILIRUBIN UA: NEGATIVE
Blood, UA: NEGATIVE
Glucose, UA: NEGATIVE
Ketones, UA: NEGATIVE
LEUKOCYTES UA: NEGATIVE
NITRITE UA: NEGATIVE
PH UA: 5
PROTEIN UA: NEGATIVE
Urobilinogen, UA: NEGATIVE

## 2016-03-10 NOTE — Progress Notes (Signed)
71 y.o. [id number] Married  Caucasian Fe here for annual exam.  Menopausal no HRT. Denies vaginal bleeding except after sexual acitvity.Sees PCP Dr Glori Bickers yearly for aex, labs and medication for cholesterol and GERD.  Had skin removal in 5/17 and follow up 12/17 all normal. Was treated with Tamiflu due to spouse being treated for flu. Using Mucinex for cough only. Still having some vaginal dryness issues with sexual activity. Not using coconut oil except for sexual activity. Has a tender area in vagina from last sexual activity. No change in vaginal discharge or odor. Took trip to beach with all family members after Christmas.  No LMP recorded. Patient has had a hysterectomy.          Sexually active: Yes.    The current method of family planning is status post hysterectomy.    Exercising: Yes.    walking Smoker:  no  Health Maintenance: Pap:  4/03 neg MMG:  07-22-15 category c density birads 1:neg Colonoscopy:  12/16 f/u 77yrs BMD:  2017 TDaP:  2012 Shingles: 2016 Pneumonia: 2017 Hep C and HIV: Hep c neg 2017 Labs: poct urine-neg Self breast exam: done every 3 mths   reports that she has never smoked. She has never used smokeless tobacco. She reports that she does not drink alcohol or use drugs.  Past Medical History:  Diagnosis Date  . Allergic rhinitis   . Anxiety   . Arthritis    in back. Physical Therapy 2016  . Blood donor   . Cancer ([place])    skin on back  . Depression   . DJD (degenerative joint disease)   . Family history of ischemic heart disease   . Gastroparesis   . GERD (gastroesophageal reflux disease)   . Hx of colonic polyps   . Hypercholesteremia   . Hyperlipidemia   . IBS (irritable bowel syndrome)     Past Surgical History:  Procedure Laterality Date  . ABDOMINAL HYSTERECTOMY  1985   TAH/BSO Age 14 DUB, pelvic pain  . BREAST BIOPSY  1980, 1983   Benign Breast Biospy x 2  . CYST EXCISION  04/2011   Thigh  . left breast biopsy     benign x 2  . SKIN  CANCER EXCISION     on back    Current Outpatient Prescriptions  Medication Sig Dispense Refill  . acetaminophen (TYLENOL) 325 MG tablet Take 650 mg by mouth every 6 (six) hours as needed.    . ALPRAZolam (XANAX) 0.5 MG tablet TAKE 1 TABLET BY MOUTH 3 TIMES A DAY AS NEEDED 90 tablet 0  . atorvastatin (LIPITOR) 20 MG tablet TAKE 1 TABLET (20 MG TOTAL) BY MOUTH DAILY. 90 tablet 3  . calcium gluconate 500 MG tablet Take 500 mg by mouth daily.    [name] Sodium 30-100 MG CAPS Take 2 capsules by mouth daily.    . Cholecalciferol (VITAMIN D) 2000 UNITS CAPS Take 1 capsule by mouth daily.    . Fish Oil-Krill Oil CAPS Take 1 capsule by mouth daily.    . GuaiFENesin (MUCINEX PO) Take by mouth 2 (two) times daily.    . Ibuprofen (ADVIL PO) Take by mouth as needed.    . Multiple Vitamins-Minerals (MULTIPLE VITAMINS/WOMENS PO) Take 1 tablet by mouth daily.    Marland Kitchen OVER THE COUNTER MEDICATION Fiber gummies    . pantoprazole (PROTONIX) 40 MG tablet TAKE 1 TABLET BY MOUTH DAILY 90 tablet 3   No current facility-administered medications for this visit.  Family History  Problem Relation Age of Onset  . Leukemia Mother   . Hyperlipidemia Sister   . Other Sister     2 cardiac episodes  . Colon cancer Neg Hx     ROS:  Pertinent items are noted in HPI.  Otherwise, a comprehensive ROS was negative.  Exam:   BP 140/72   Pulse 70   Temp 97.6 F (36.4 C) (Oral)   Resp 16   Ht 5' 5.25" (1.657 m)   Wt 145 lb (65.8 kg)   BMI 23.94 kg/m  Height: 5' 5.25" (165.7 cm) Ht Readings from Last 3 Encounters:  03/10/16 5' 5.25" (1.657 m)  01/14/16 5' 5.5" (1.664 m)  12/31/15 5' 5.5" (1.664 m)    General appearance: alert, cooperative and appears stated age Head: Normocephalic, without obvious abnormality, atraumatic Neck: no adenopathy, supple, symmetrical, trachea midline and thyroid normal to inspection and palpation Lungs: clear to auscultation bilaterally, no rales or wheezing  noted Breasts: normal appearance, no masses or tenderness, No nipple retraction or dimpling, No nipple discharge or bleeding, No axillary or supraclavicular adenopathy Heart: regular rate and rhythm Abdomen: soft, non-tender; no masses,  no organomegaly Extremities: extremities normal, atraumatic, no cyanosis or edema Skin: Skin color, texture, turgor normal. No rashes or lesions Lymph nodes: Cervical, supraclavicular, and axillary nodes normal. No abnormal inguinal nodes palpated Neurologic: Grossly normal   Pelvic: External genitalia:  no lesions              Urethra:  normal appearing urethra with no masses, tenderness or lesions              Bartholin's and Skene's: normal                 Vagina: atrophic appearing vagina with normal color and scant moisture noted , no lesions. Small superficial tear, healing, noted at 9 o'clock at introitus, no bleeding noted              Cervix: absent              Pap taken: No. Bimanual Exam:  Uterus:  uterus absent              Adnexa: no mass, fullness, tenderness and adnexa surgically absent               Rectovaginal: Confirms               Anus:  normal sphincter tone, no lesions  Chaperone present: yes  A:  Well Woman with normal exam  Menopausal no HRT, TAH with BSO for pelvic pain  Atrophic vaginitis with small superficial tear noted  GERD and cholesterol management with PCP  Flu Exposure with spouse and was treated with Tamiflu, has cough and on Mucinex, saw PCP for   P:   Reviewed health and wellness pertinent to exam  Discussed vaginal findings and shown to patient in the mirror. Prefers no Estrogen use. Instructions given for daily coconut oil use vaginally and prior to sexual activity. No sexual activity until area is healed and no tenderness. Patient agreeable. Will advise if not changing or becomes worse.  Continue follow up with PCP as indicated  Pap smear as above not taken   counseled on breast self exam, mammography  screening, adequate intake of calcium and vitamin D, diet and exercise  return annually or prn  An After Visit Summary was printed and given to the patient.

## 2016-03-10 NOTE — Patient Instructions (Signed)
EXERCISE AND DIET:  We recommended that you start or continue a regular exercise program for good health. Regular exercise means any activity that makes your heart beat faster and makes you sweat.  We recommend exercising at least 30 minutes per day at least 3 days a week, preferably 4 or 5.  We also recommend a diet low in fat and sugar.  Inactivity, poor dietary choices and obesity can cause diabetes, heart attack, stroke, and kidney damage, among others.    ALCOHOL AND SMOKING:  Women should limit their alcohol intake to no more than 7 drinks/beers/glasses of wine (combined, not each!) per week. Moderation of alcohol intake to this level decreases your risk of breast cancer and liver damage. And of course, no recreational drugs are part of a healthy lifestyle.  And absolutely no smoking or even second hand smoke. Most people know smoking can cause heart and lung diseases, but did you know it also contributes to weakening of your bones? Aging of your skin?  Yellowing of your teeth and nails?  CALCIUM AND VITAMIN D:  Adequate intake of calcium and Vitamin D are recommended.  The recommendations for exact amounts of these supplements seem to change often, but generally speaking 600 mg of calcium (either carbonate or citrate) and 800 units of Vitamin D per day seems prudent. Certain women may benefit from higher intake of Vitamin D.  If you are among these women, your doctor will have told you during your visit.    PAP SMEARS:  Pap smears, to check for cervical cancer or precancers,  have traditionally been done yearly, although recent scientific advances have shown that most women can have pap smears less often.  However, every woman still should have a physical exam from her gynecologist every year. It will include a breast check, inspection of the vulva and vagina to check for abnormal growths or skin changes, a visual exam of the cervix, and then an exam to evaluate the size and shape of the uterus and  ovaries.  And after 70 years of age, a rectal exam is indicated to check for rectal cancers. We will also provide age appropriate advice regarding health maintenance, like when you should have certain vaccines, screening for sexually transmitted diseases, bone density testing, colonoscopy, mammograms, etc.   MAMMOGRAMS:  All women over 40 years old should have a yearly mammogram. Many facilities now offer a "3D" mammogram, which may cost around $50 extra out of pocket. If possible,  we recommend you accept the option to have the 3D mammogram performed.  It both reduces the number of women who will be called back for extra views which then turn out to be normal, and it is better than the routine mammogram at detecting truly abnormal areas.    COLONOSCOPY:  Colonoscopy to screen for colon cancer is recommended for all women at age 50.  We know, you hate the idea of the prep.  We agree, BUT, having colon cancer and not knowing it is worse!!  Colon cancer so often starts as a polyp that can be seen and removed at colonscopy, which can quite literally save your life!  And if your first colonoscopy is normal and you have no family history of colon cancer, most women don't have to have it again for 10 years.  Once every ten years, you can do something that may end up saving your life, right?  We will be happy to help you get it scheduled when you are ready.    Be sure to check your insurance coverage so you understand how much it will cost.  It may be covered as a preventative service at no cost, but you should check your particular policy.      Atrophic Vaginitis Introduction Atrophic vaginitis is when the tissues that line the vagina become dry and thin. This is caused by a drop in estrogen. Estrogen helps:  To keep the vagina moist.  To make a clear fluid that helps:  To lubricate the vagina for sex.  To protect the vagina from infection. If the lining of the vagina is dry and thin, it may:  Make sex  painful. It may also cause bleeding.  Cause a feeling of:  Burning.  Irritation.  Itchiness.  Make an exam of your vagina painful. It may also cause bleeding.  Make you lose interest in sex.  Cause a burning feeling when you pee.  Make your vaginal fluid (discharge) brown or yellow. For some women, there are no symptoms. This condition is most common in women who do not get their regular menstrual periods anymore (menopause). This often starts when a woman is 17-72 years old. Follow these instructions at home:  Take medicines only as told by your doctor. Do not use any herbal or alternative medicines unless your doctor says it is okay.  Use over-the-counter products for dryness only as told by your doctor. These include:  Creams.  Lubricants.  Moisturizers.  Do not douche.  Do not use products that can make your vagina dry. These include:  Scented feminine sprays.  Scented tampons.  Scented soaps.  If it hurts to have sex, tell your sexual partner. Contact a doctor if:  Your discharge looks different than normal.  Your vagina has an unusual smell.  You have new symptoms.  Your symptoms do not get better with treatment.  Your symptoms get worse. This information is not intended to replace advice given to you by your health care provider. Make sure you discuss any questions you have with your health care provider. Document Released: 07/15/2007 Document Revised: 07/04/2015 Document Reviewed: 01/17/2014  2017 Elsevier

## 2016-03-11 NOTE — Progress Notes (Signed)
Encounter reviewed Egor Fullilove, MD   

## 2016-04-01 DIAGNOSIS — Z0101 Encounter for examination of eyes and vision with abnormal findings: Secondary | ICD-10-CM | POA: Diagnosis not present

## 2016-04-21 ENCOUNTER — Other Ambulatory Visit: Payer: Self-pay | Admitting: *Deleted

## 2016-04-21 MED ORDER — FLUTICASONE PROPIONATE 50 MCG/ACT NA SUSP: 2.0000 | Freq: Every day | NASAL | 5 refills | Status: DC

## 2016-04-21 NOTE — Telephone Encounter (Signed)
Fax refill request, CPE scheduled 06/29/16, last filled on 11/28/2013, please advise

## 2016-04-21 NOTE — Telephone Encounter (Signed)
Will refill electronically  

## 2016-04-27 DIAGNOSIS — H00011 Hordeolum externum right upper eyelid: Secondary | ICD-10-CM | POA: Diagnosis not present

## 2016-04-28 DIAGNOSIS — R69 Illness, unspecified: Secondary | ICD-10-CM | POA: Diagnosis not present

## 2016-05-01 DIAGNOSIS — H0012 Chalazion right lower eyelid: Secondary | ICD-10-CM | POA: Diagnosis not present

## 2016-05-10 ENCOUNTER — Other Ambulatory Visit: Payer: Self-pay | Admitting: Family Medicine

## 2016-05-11 NOTE — Telephone Encounter (Signed)
Pt has CPE scheduled for 06/29/16, last filled on 03/03/16 #90 tabs with 0 refills, please advise

## 2016-05-11 NOTE — Telephone Encounter (Signed)
Px written for call in   

## 2016-05-12 NOTE — Telephone Encounter (Signed)
Rx called in as prescribed 

## 2016-05-26 DIAGNOSIS — Z0101 Encounter for examination of eyes and vision with abnormal findings: Secondary | ICD-10-CM | POA: Diagnosis not present

## 2016-06-13 ENCOUNTER — Telehealth: Payer: Self-pay | Admitting: Family Medicine

## 2016-06-13 DIAGNOSIS — Z0000 Encounter for general adult medical examination without abnormal findings: Secondary | ICD-10-CM

## 2016-06-13 NOTE — Telephone Encounter (Signed)
-----   Message from Marchia Bond sent at 06/08/2016  1:14 PM EDT ----- Regarding: Cpx labs Thurs 5/10, need orders. Thanks! :-) Please order  future cpx labs for pt's upcoming lab appt. Thanks Aniceto Boss

## 2016-06-18 ENCOUNTER — Other Ambulatory Visit: Payer: Medicare HMO

## 2016-06-18 ENCOUNTER — Ambulatory Visit (INDEPENDENT_AMBULATORY_CARE_PROVIDER_SITE_OTHER): Payer: Medicare HMO

## 2016-06-18 VITALS — BP 124/80 | HR 78 | Temp 98.4°F | Ht 65.25 in | Wt 145.5 lb

## 2016-06-18 DIAGNOSIS — Z0000 Encounter for general adult medical examination without abnormal findings: Secondary | ICD-10-CM | POA: Diagnosis not present

## 2016-06-18 LAB — LIPID PANEL
Cholesterol: 172 mg/dL (ref 0–200)
HDL: 64.9 mg/dL (ref >39.00)
LDL CALC: 86 mg/dL (ref 0–99)
NONHDL: 106.75
Total CHOL/HDL Ratio: 3
Triglycerides: 104 mg/dL (ref 0.0–149.0)
VLDL: 20.8 mg/dL (ref 0.0–40.0)

## 2016-06-18 LAB — COMPREHENSIVE METABOLIC PANEL
ALBUMIN: 4.6 g/dL (ref 3.5–5.2)
ALK PHOS: 62 U/L (ref 39–117)
ALT: 20 U/L (ref 0–35)
AST: 22 U/L (ref 0–37)
BILIRUBIN TOTAL: 0.4 mg/dL (ref 0.2–1.2)
BUN: 15 mg/dL (ref 6–23)
CALCIUM: 10.1 mg/dL (ref 8.4–10.5)
CO2: 32 mEq/L (ref 19–32)
CREATININE: 0.92 mg/dL (ref 0.40–1.20)
Chloride: 102 mEq/L (ref 96–112)
GFR: 64.25 mL/min (ref >60.00)
Glucose, Bld: 96 mg/dL (ref 70–99)
Potassium: 3.9 mEq/L (ref 3.5–5.1)
Sodium: 140 mEq/L (ref 135–145)
TOTAL PROTEIN: 7.3 g/dL (ref 6.0–8.3)

## 2016-06-18 LAB — CBC WITH DIFFERENTIAL/PLATELET
BASOS PCT: 0.5 % (ref 0.0–3.0)
Basophils Absolute: 0 10*3/uL (ref 0.0–0.1)
Eosinophils Absolute: 0.2 10*3/uL (ref 0.0–0.7)
Eosinophils Relative: 2.9 % (ref 0.0–5.0)
HCT: 39.6 % (ref 36.0–46.0)
HEMOGLOBIN: 13.4 g/dL (ref 12.0–15.0)
LYMPHS PCT: 37.1 % (ref 12.0–46.0)
Lymphs Abs: 2.1 10*3/uL (ref 0.7–4.0)
MCHC: 33.8 g/dL (ref 30.0–36.0)
MCV: 90.1 fl (ref 78.0–100.0)
Monocytes Absolute: 0.4 10*3/uL (ref 0.1–1.0)
Monocytes Relative: 7 % (ref 3.0–12.0)
Neutro Abs: 3 10*3/uL (ref 1.4–7.7)
Neutrophils Relative %: 52.5 % (ref 43.0–77.0)
Platelets: 185 10*3/uL (ref 150.0–400.0)
RBC: 4.39 Mil/uL (ref 3.87–5.11)
RDW: 12.9 % (ref 11.5–15.5)
WBC: 5.7 10*3/uL (ref 4.0–10.5)

## 2016-06-18 LAB — TSH: TSH: 1.93 u[IU]/mL (ref 0.35–4.50)

## 2016-06-18 NOTE — Progress Notes (Signed)
Pre visit review using our clinic review tool, if applicable. No additional management support is needed unless otherwise documented below in the visit note. 

## 2016-06-18 NOTE — Patient Instructions (Signed)
Tabitha Carlson , Thank you for taking time to come for your Medicare Wellness Visit. I appreciate your ongoing commitment to your health goals. Please review the following plan we discussed and let me know if I can assist you in the future.   These are the goals we discussed: Goals    . Increase physical activity          Starting 06/18/16, I will continue to exercise for at least 60 min 6 days per week.        This is a list of the screening recommended for you and due dates:  Health Maintenance  Topic Date Due  . Flu Shot  09/09/2016  . Mammogram  07/21/2017  . Tetanus Vaccine  03/20/2020  . Colon Cancer Screening  12/18/2024  . DEXA scan (bone density measurement)  Completed  .  Hepatitis C: One time screening is recommended by Center for Disease Control  (CDC) for  adults born from [birth year] through 1964.   Completed  . Pneumonia vaccines  Completed   Preventive Care for Adults  A healthy lifestyle and preventive care can promote health and wellness. Preventive health guidelines for adults include the following key practices.  . A routine yearly physical is a good way to check with your health care provider about your health and preventive screening. It is a chance to share any concerns and updates on your health and to receive a thorough exam.  . Visit your dentist for a routine exam and preventive care every 6 months. Brush your teeth twice a day and floss once a day. Good oral hygiene prevents tooth decay and gum disease.  . The frequency of eye exams is based on your age, health, family medical history, use  of contact lenses, and other factors. Follow your health care provider's ecommendations for frequency of eye exams.  . Eat a healthy diet. Foods like vegetables, fruits, whole grains, low-fat dairy products, and lean protein foods contain the nutrients you need without too many calories. Decrease your intake of foods high in solid fats, added sugars, and salt. Eat the right amount  of calories for you. Get information about a proper diet from your health care provider, if necessary.  . Regular physical exercise is one of the most important things you can do for your health. Most adults should get at least 150 minutes of moderate-intensity exercise (any activity that increases your heart rate and causes you to sweat) each week. In addition, most adults need muscle-strengthening exercises on 2 or more days a week.  Silver Sneakers may be a benefit available to you. To determine eligibility, you may visit the website: www.silversneakers.com or contact program at (732) 716-7431 Mon-Fri between 8AM-8PM.   . Maintain a healthy weight. The body mass index (BMI) is a screening tool to identify possible weight problems. It provides an estimate of body fat based on height and weight. Your health care provider can find your BMI and can help you achieve or maintain a healthy weight.   For adults 20 years and older: ? A BMI below 18.5 is considered underweight. ? A BMI of 18.5 to 24.9 is normal. ? A BMI of 25 to 29.9 is considered overweight. ? A BMI of 30 and above is considered obese.   . Maintain normal blood lipids and cholesterol levels by exercising and minimizing your intake of saturated fat. Eat a balanced diet with plenty of fruit and vegetables. Blood tests for lipids and cholesterol should begin at age  20 and be repeated every 5 years. If your lipid or cholesterol levels are high, you are over 50, or you are at high risk for heart disease, you may need your cholesterol levels checked more frequently. Ongoing high lipid and cholesterol levels should be treated with medicines if diet and exercise are not working.  . If you smoke, find out from your health care provider how to quit. If you do not use tobacco, please do not start.  . If you choose to drink alcohol, please do not consume more than 2 drinks per day. One drink is considered to be 12 ounces (355 mL) of beer, 5 ounces  (148 mL) of wine, or 1.5 ounces (44 mL) of liquor.  . If you are 69-54 years old, ask your health care provider if you should take aspirin to prevent strokes.  . Use sunscreen. Apply sunscreen liberally and repeatedly throughout the day. You should seek shade when your shadow is shorter than you. Protect yourself by wearing long sleeves, pants, a wide-brimmed hat, and sunglasses year round, whenever you are outdoors.  . Once a month, do a whole body skin exam, using a mirror to look at the skin on your back. Tell your health care provider of new moles, moles that have irregular borders, moles that are larger than a pencil eraser, or moles that have changed in shape or color.

## 2016-06-18 NOTE — Progress Notes (Signed)
Subjective:   Tabitha Carlson is a 71 y.o. female who presents for Medicare Annual (Subsequent) preventive examination.  Review of Systems:  N/A Cardiac Risk Factors include: advanced age (>69men, >43 women);dyslipidemia     Objective:     Vitals: BP 124/80 (BP Location: Left Arm, Patient Position: Sitting, Cuff Size: Normal)   Pulse 78   Temp 98.4 F (36.9 C) (Oral)   Ht 5' 5.25" (1.657 m) Comment: no shoes  Wt 145 lb 8 oz (66 kg)   SpO2 98%   BMI 24.03 kg/m   Body mass index is 24.03 kg/m.   Tobacco History  Smoking Status  . Never Smoker  Smokeless Tobacco  . Never Used     Counseling given: No   Past Medical History:  Diagnosis Date  . Allergic rhinitis   . Anxiety   . Arthritis    in back. Physical Therapy 2016  . Blood donor   . Cancer ([place])    skin on back  . Depression   . DJD (degenerative joint disease)   . Family history of ischemic heart disease   . Gastroparesis   . GERD (gastroesophageal reflux disease)   . Hx of colonic polyps   . Hypercholesteremia   . Hyperlipidemia   . IBS (irritable bowel syndrome)    Past Surgical History:  Procedure Laterality Date  . ABDOMINAL HYSTERECTOMY  1985   TAH/BSO Age 62 DUB, pelvic pain  . BREAST BIOPSY  1980, 1983   Benign Breast Biospy x 2  . CYST EXCISION  04/2011   Thigh  . left breast biopsy     benign x 2  . SKIN CANCER EXCISION     on back   Family History  Problem Relation Age of Onset  . Leukemia Mother   . Hyperlipidemia Sister   . Other Sister        2 cardiac episodes  . Colon cancer Neg Hx    History  Sexual Activity  . Sexual activity: Yes  . Partners: Male  . Birth control/ protection: Surgical    Comment: Hysterectomy    Outpatient Encounter Prescriptions as of 06/18/2016  Medication Sig  . acetaminophen (TYLENOL) 325 MG tablet Take 650 mg by mouth every 6 (six) hours as needed.  . ALPRAZolam (XANAX) 0.5 MG tablet TAKE 1 TABLET BY MOUTH 3 TIMES A DAY AS NEEDED  .  atorvastatin (LIPITOR) 20 MG tablet TAKE 1 TABLET (20 MG TOTAL) BY MOUTH DAILY.  . calcium gluconate 500 MG tablet Take 500 mg by mouth daily.  [name] Sodium 30-100 MG CAPS Take 2 capsules by mouth daily.  . Cholecalciferol (VITAMIN D) 2000 UNITS CAPS Take 1 capsule by mouth daily.  . Fish Oil-Krill Oil CAPS Take 1 capsule by mouth daily.  . fluticasone (FLONASE) 50 MCG/ACT nasal spray Place 2 sprays into both nostrils daily.  . Ibuprofen (ADVIL PO) Take by mouth as needed.  . Multiple Vitamins-Minerals (MULTIPLE VITAMINS/WOMENS PO) Take 1 tablet by mouth daily.  Marland Kitchen OVER THE COUNTER MEDICATION Fiber gummies  . pantoprazole (PROTONIX) 40 MG tablet TAKE 1 TABLET BY MOUTH DAILY  . [DISCONTINUED] GuaiFENesin (MUCINEX PO) Take by mouth 2 (two) times daily.   No facility-administered encounter medications on file as of 06/18/2016.     Activities of Daily Living In your present state of health, do you have any difficulty performing the following activities: 06/18/2016  Hearing? N  Vision? N  Difficulty concentrating or making decisions? N  Walking  or climbing stairs? N  Dressing or bathing? N  Doing errands, shopping? N  Preparing Food and eating ? N  Using the Toilet? N  In the past six months, have you accidently leaked urine? N  Do you have problems with loss of bowel control? N  Managing your Medications? N  Managing your Finances? N  Housekeeping or managing your Housekeeping? N  Some recent data might be hidden    Patient Care Team: Tower, Wynelle Fanny, MD as PCP - General (Family Medicine) Ralene Bathe, MD as Consulting Physician (Ophthalmology) Regina Eck, CNM as Referring Physician (Certified Nurse Midwife)    Assessment:     Hearing Screening   125Hz  250Hz  500Hz  1000Hz  2000Hz  3000Hz  4000Hz  6000Hz  8000Hz   Right ear:   40 40 40  40    Left ear:   40 40 40  40    Vision Screening Comments: Last vision exam in September 2017   Exercise Activities  and Dietary recommendations Current Exercise Habits: Home exercise routine, Type of exercise: walking, Time (Minutes): 60, Frequency (Times/Week): 6, Weekly Exercise (Minutes/Week): 360, Intensity: Moderate, Exercise limited by: None identified  Goals    . Increase physical activity          Starting 06/18/16, I will continue to exercise for at least 60 min 6 days per week.       Fall Risk Fall Risk  06/18/2016 06/18/2015 06/04/2015 05/18/2014 05/08/2013  Falls in the past year? Yes Yes Yes No No  Number falls in past yr: 1 1 1  - -  Injury with Fall? Yes Yes Yes - -  Risk Factor Category  - - High Fall Risk - -  Follow up Falls prevention discussed Falls evaluation completed - - -   Depression Screen PHQ 2/9 Scores 06/18/2016 06/18/2015 06/04/2015 05/18/2014  PHQ - 2 Score 0 0 0 0     Cognitive Function MMSE - Mini Mental State Exam 06/18/2016 06/18/2015  Orientation to time 5 5  Orientation to Place 5 5  Registration 3 3  Attention/ Calculation 0 0  Recall 3 3  Language- name 2 objects 0 0  Language- repeat 1 1  Language- follow 3 step command 3 3  Language- read & follow direction 0 0  Write a sentence 0 0  Copy design 0 0  Total score 20 20       PLEASE NOTE: A Mini-Cog screen was completed. Maximum score is 20. A value of 0 denotes this part of Folstein MMSE was not completed or the patient failed this part of the Mini-Cog screening.   Mini-Cog Screening Orientation to Time - Max 5 pts Orientation to Place - Max 5 pts Registration - Max 3 pts Recall - Max 3 pts Language Repeat - Max 1 pts Language Follow 3 Step Command - Max 3 pts   Immunization History  Administered Date(s) Administered  . Influenza Split 11/01/2010, 10/23/2011  . Influenza Whole 10/10/2009  . Influenza, High Dose Seasonal PF 10/23/2014  . Influenza, Seasonal, Injecte, Preservative Fre 11/14/2015  . Influenza,inj,Quad PF,36+ Mos 10/26/2012, 10/20/2013  . Pneumococcal Conjugate-13 06/04/2015  .  Pneumococcal Polysaccharide-23 10/20/2013  . Td 03/04/2000, 03/20/2010  . Zoster 12/11/2014   Screening Tests Health Maintenance  Topic Date Due  . INFLUENZA VACCINE  09/09/2016  . MAMMOGRAM  07/21/2017  . TETANUS/TDAP  03/20/2020  . COLONOSCOPY  12/18/2024  . DEXA SCAN  Completed  . Hepatitis C Screening  Completed  . PNA vac Low Risk Adult  Completed      Plan:     I have personally reviewed and addressed the Medicare Annual Wellness questionnaire and have noted the following in the patient's chart:  A. Medical and social history B. Use of alcohol, tobacco or illicit drugs  C. Current medications and supplements D. Functional ability and status E.  Nutritional status F.  Physical activity G. Advance directives H. List of other physicians I.  Hospitalizations, surgeries, and ER visits in previous 12 months J.  [place] to include hearing, vision, cognitive, depression L. Referrals and appointments - none  In addition, I have reviewed and discussed with patient certain preventive protocols, quality metrics, and best practice recommendations. A written personalized care plan for preventive services as well as general preventive health recommendations were provided to patient.  See attached scanned questionnaire for additional information.   Signed,   Lindell Noe, MHA, BS, LPN Health Coach

## 2016-06-18 NOTE — Progress Notes (Signed)
PCP notes:   Health maintenance:  No gaps identified.  Abnormal screenings:   Fall risk - hx of fall with injury  Patient concerns:   None  Nurse concerns:  None  Next PCP appt:   06/29/16 @ 1030

## 2016-06-19 NOTE — Progress Notes (Signed)
I reviewed health advisor's note, was available for consultation, and agree with documentation and plan.  

## 2016-06-23 ENCOUNTER — Encounter: Payer: Medicare HMO | Admitting: Family Medicine

## 2016-06-29 ENCOUNTER — Encounter: Payer: Self-pay | Admitting: Family Medicine

## 2016-06-29 ENCOUNTER — Ambulatory Visit (INDEPENDENT_AMBULATORY_CARE_PROVIDER_SITE_OTHER): Payer: Medicare HMO | Admitting: Family Medicine

## 2016-06-29 VITALS — BP 132/66 | HR 74 | Temp 98.1°F | Ht 65.25 in | Wt 146.5 lb

## 2016-06-29 DIAGNOSIS — M85859 Other specified disorders of bone density and structure, unspecified thigh: Secondary | ICD-10-CM

## 2016-06-29 DIAGNOSIS — Z0000 Encounter for general adult medical examination without abnormal findings: Secondary | ICD-10-CM

## 2016-06-29 DIAGNOSIS — E7800 Pure hypercholesterolemia, unspecified: Secondary | ICD-10-CM

## 2016-06-29 MED ORDER — ATORVASTATIN CALCIUM 20 MG PO TABS: ORAL_TABLET | ORAL | 3 refills | Status: DC

## 2016-06-29 MED ORDER — PANTOPRAZOLE SODIUM 40 MG PO TBEC: DELAYED_RELEASE_TABLET | ORAL | 3 refills | Status: DC

## 2016-06-29 NOTE — Progress Notes (Signed)
Subjective:    Patient ID: Tabitha Carlson, female    DOB: 1945/12/05, 71 y.o.   MRN: [id number]  HPI  Here for health maintenance exam and to review chronic medical problems    Doing great overall   Had amw 5/10 Had one fall in the past year- she stepped out of the shower and slipped (she uses a towel now to prevent falls) Usually balance is pretty good   Wt Readings from Last 3 Encounters:  06/29/16 146 lb 8 oz (66.5 kg)  06/18/16 145 lb 8 oz (66 kg)  03/10/16 145 lb (65.8 kg)  stable - she does well with diet and exercise  bmi 24.1  Mammogram 6/17 nl - due for a mammogram next month (will schedule that) Self breast exam-no lumps   Colonoscopy 11/16- nl with 10 year recall   dexa 6/17- osteopenia in the femoral neck  She is taking ca and D and exercises  One fall/ no fractures   Zoster vaccine 11/16 Open to Shingrix if covered   Hysterectomy in the past /total Had a well woman exam with gyn 1/18 and diag with atrophic vaginitis (using coconut oil for that)  Had a breast exam then   Hx of hyperlipidemia Lab Results  Component Value Date   CHOL 172 06/18/2016   CHOL 165 05/21/2015   CHOL 167 05/11/2014   Lab Results  Component Value Date   HDL 64.90 06/18/2016   HDL 57.60 05/21/2015   HDL 51.60 05/11/2014   Lab Results  Component Value Date   LDLCALC 86 06/18/2016   LDLCALC 85 05/21/2015   LDLCALC 96 05/11/2014   Lab Results  Component Value Date   TRIG 104.0 06/18/2016   TRIG 115.0 05/21/2015   TRIG 99.0 05/11/2014   Lab Results  Component Value Date   CHOLHDL 3 06/18/2016   CHOLHDL 3 05/21/2015   CHOLHDL 3 05/11/2014   No results found for: LDLDIRECT   Very well controlled lipitor and very healthy low trans/sat fat diet  Not much meat     Chemistry      Component Value Date/Time   NA 140 06/18/2016 1235   K 3.9 06/18/2016 1235   CL 102 06/18/2016 1235   CO2 32 06/18/2016 1235   BUN 15 06/18/2016 1235   CREATININE 0.92 06/18/2016 1235       Component Value Date/Time   CALCIUM 10.1 06/18/2016 1235   ALKPHOS 62 06/18/2016 1235   AST 22 06/18/2016 1235   ALT 20 06/18/2016 1235   BILITOT 0.4 06/18/2016 1235      Lab Results  Component Value Date   WBC 5.7 06/18/2016   HGB 13.4 06/18/2016   HCT 39.6 06/18/2016   MCV 90.1 06/18/2016   PLT 185.0 06/18/2016   Lab Results  Component Value Date   TSH 1.93 06/18/2016     Patient Active Problem List   Diagnosis Date Noted  . Estrogen deficiency 06/04/2015  . Skin lesion of back 06/04/2015  . Routine general medical examination at a health care facility 05/18/2015  . Ankle injury 01/14/2015  . Left foot pain 01/14/2015  . Encounter for Medicare annual wellness exam 05/18/2014  . Thoracic back pain 11/22/2013  . Left sided chest pain 11/13/2013  . Rhomboid muscle pain 11/13/2013  . Fatigue 11/13/2013  . Osteopenia 10/20/2013  . GASTROPARESIS 01/02/2008  . ALLERGIC RHINITIS 09/12/2007  . COLONIC POLYPS 03/16/2007  . GERD 03/16/2007  . Hyperlipidemia 02/21/2007  . Anxiety state 02/21/2007  .  IBS 02/21/2007  . DEGENERATIVE JOINT DISEASE 02/21/2007   Past Medical History:  Diagnosis Date  . Allergic rhinitis   . Anxiety   . Arthritis    in back. Physical Therapy 2016  . Blood donor   . Cancer ([place])    skin on back  . Depression   . DJD (degenerative joint disease)   . Family history of ischemic heart disease   . Gastroparesis   . GERD (gastroesophageal reflux disease)   . Hx of colonic polyps   . Hypercholesteremia   . Hyperlipidemia   . IBS (irritable bowel syndrome)    Past Surgical History:  Procedure Laterality Date  . ABDOMINAL HYSTERECTOMY  1985   TAH/BSO Age 67 DUB, pelvic pain  . BREAST BIOPSY  1980, 1983   Benign Breast Biospy x 2  . CYST EXCISION  04/2011   Thigh  . left breast biopsy     benign x 2  . SKIN CANCER EXCISION     on back   Social History  Substance Use Topics  . Smoking status: Never Smoker  . Smokeless tobacco:  Never Used  . Alcohol use No   Family History  Problem Relation Age of Onset  . Leukemia Mother   . Hyperlipidemia Sister   . Other Sister        2 cardiac episodes  . Colon cancer Neg Hx    Allergies  Allergen Reactions  . Aspirin     Unable to take a full aspirin--causes palpitations Tachycardia  . Crab [Shellfish Allergy]     Rash from head to toe   Current Outpatient Prescriptions on File Prior to Visit  Medication Sig Dispense Refill  . acetaminophen (TYLENOL) 325 MG tablet Take 650 mg by mouth every 6 (six) hours as needed.    . ALPRAZolam (XANAX) 0.5 MG tablet TAKE 1 TABLET BY MOUTH 3 TIMES A DAY AS NEEDED 90 tablet 0  . atorvastatin (LIPITOR) 20 MG tablet TAKE 1 TABLET (20 MG TOTAL) BY MOUTH DAILY. 90 tablet 3  . calcium gluconate 500 MG tablet Take 500 mg by mouth daily.    [name] Sodium 30-100 MG CAPS Take 2 capsules by mouth daily.    . Cholecalciferol (VITAMIN D) 2000 UNITS CAPS Take 1 capsule by mouth daily.    . Fish Oil-Krill Oil CAPS Take 1 capsule by mouth daily.    . fluticasone (FLONASE) 50 MCG/ACT nasal spray Place 2 sprays into both nostrils daily. 16 g 5  . Ibuprofen (ADVIL PO) Take by mouth as needed.    . Multiple Vitamins-Minerals (MULTIPLE VITAMINS/WOMENS PO) Take 1 tablet by mouth daily.    Marland Kitchen OVER THE COUNTER MEDICATION Fiber gummies    . pantoprazole (PROTONIX) 40 MG tablet TAKE 1 TABLET BY MOUTH DAILY 90 tablet 3   No current facility-administered medications on file prior to visit.      Review of Systems Review of Systems  Constitutional: Negative for fever, appetite change, fatigue and unexpected weight change.  Eyes: Negative for pain and visual disturbance.  Respiratory: Negative for cough and shortness of breath.   Cardiovascular: Negative for cp or palpitations    Gastrointestinal: Negative for nausea, diarrhea and constipation.  Genitourinary: Negative for urgency and frequency.  Skin: Negative for pallor or rash     Neurological: Negative for weakness, light-headedness, numbness and headaches.  Hematological: Negative for adenopathy. Does not bruise/bleed easily.  Psychiatric/Behavioral: Negative for dysphoric mood. The patient is not nervous/anxious.  Objective:   Physical Exam  Constitutional: She appears well-developed and well-nourished. No distress.  Well appearing   HENT:  Head: Normocephalic and atraumatic.  Right Ear: External ear normal.  Left Ear: External ear normal.  Mouth/Throat: Oropharynx is clear and moist.  Eyes: Conjunctivae and EOM are normal. Pupils are equal, round, and reactive to light. No scleral icterus.  Neck: Normal range of motion. Neck supple. No JVD present. Carotid bruit is not present. No thyromegaly present.  Cardiovascular: Normal rate, regular rhythm, normal heart sounds and intact distal pulses.  Exam reveals no gallop.   Pulmonary/Chest: Effort normal and breath sounds normal. No respiratory distress. She has no wheezes. She exhibits no tenderness.  Abdominal: Soft. Bowel sounds are normal. She exhibits no distension, no abdominal bruit and no mass. There is no tenderness.  Genitourinary: No breast swelling, tenderness, discharge or bleeding.  Genitourinary Comments: Breast exam: No mass, nodules, thickening, tenderness, bulging, retraction, inflamation, nipple discharge or skin changes noted.  No axillary or clavicular LA.      Musculoskeletal: Normal range of motion. She exhibits no edema or tenderness.  No kyphosis   Lymphadenopathy:    She has no cervical adenopathy.  Neurological: She is alert. She has normal reflexes. No cranial nerve deficit. She exhibits normal muscle tone. Coordination normal.  Skin: Skin is warm and dry. No rash noted. No erythema. No pallor.  Tanned with lentigines   Psychiatric: She has a normal mood and affect.  Pleasant and talkative           Assessment & Plan:   Problem List Items Addressed This Visit       Musculoskeletal and Integument   Osteopenia - Primary    Rev dexa 6/17 - FN openia  One fall/no fx Exercises and takes ca and D Will re check at the 2 y mark        Other   Hyperlipidemia    Disc goals for lipids and reasons to control them Rev labs with pt Rev low sat fat diet in detail Well controlled with atorvastatin and diet today      Relevant Medications   atorvastatin (LIPITOR) 20 MG tablet   Routine general medical examination at a health care facility    Reviewed health habits including diet and exercise and skin cancer prevention Reviewed appropriate screening tests for age  Also reviewed health mt list, fam hx and immunization status , as well as social and family history   See HPI Labs reviewed  AMW reviewed Disc fall prev  Disc dexa - due in 2019 She will schedule her own mammogram next month  Good health habits Disc sun protection

## 2016-06-29 NOTE — Assessment & Plan Note (Signed)
Disc goals for lipids and reasons to control them Rev labs with pt Rev low sat fat diet in detail Well controlled with atorvastatin and diet today

## 2016-06-29 NOTE — Assessment & Plan Note (Signed)
Rev dexa 6/17 - FN openia  One fall/no fx Exercises and takes ca and D Will re check at the 2 y mark

## 2016-06-29 NOTE — Assessment & Plan Note (Signed)
Reviewed health habits including diet and exercise and skin cancer prevention Reviewed appropriate screening tests for age  Also reviewed health mt list, fam hx and immunization status , as well as social and family history   See HPI Labs reviewed  AMW reviewed Disc fall prev  Disc dexa - due in 2019 She will schedule her own mammogram next month  Good health habits Disc sun protection

## 2016-06-29 NOTE — Patient Instructions (Addendum)
Don't forget to schedule your mammogram next month  We can check your next bone density test 6/19   If you are interested in the new shingles vaccine (Shingrix) - call your insurance to check on coverage,( you should not get it within 1 month of other vaccines) , then call us for a prescription  for it to take to a pharmacy that gives the shot , or make a nurse visit to get it here depending on your coverage

## 2016-07-04 ENCOUNTER — Other Ambulatory Visit: Payer: Self-pay | Admitting: Family Medicine

## 2016-07-12 DIAGNOSIS — Z0101 Encounter for examination of eyes and vision with abnormal findings: Secondary | ICD-10-CM | POA: Diagnosis not present

## 2016-07-13 DIAGNOSIS — R69 Illness, unspecified: Secondary | ICD-10-CM | POA: Diagnosis not present

## 2016-07-14 ENCOUNTER — Other Ambulatory Visit: Payer: Self-pay | Admitting: Family Medicine

## 2016-07-15 NOTE — Telephone Encounter (Signed)
CPE was done on 06/29/16 an pt already has next years appts scheduled. Last filled on 05/11/16 #90 tabs with 0 refills, please advise

## 2016-07-15 NOTE — Telephone Encounter (Signed)
Rx called in as prescribed 

## 2016-07-15 NOTE — Telephone Encounter (Signed)
Px written for call in   

## 2016-08-25 DIAGNOSIS — Z0101 Encounter for examination of eyes and vision with abnormal findings: Secondary | ICD-10-CM | POA: Diagnosis not present

## 2016-08-31 ENCOUNTER — Other Ambulatory Visit: Payer: Self-pay | Admitting: Family Medicine

## 2016-08-31 DIAGNOSIS — Z1231 Encounter for screening mammogram for malignant neoplasm of breast: Secondary | ICD-10-CM

## 2016-09-22 ENCOUNTER — Ambulatory Visit
Admission: RE | Admit: 2016-09-22 | Discharge: 2016-09-22 | Disposition: A | Discharge status: Home / Self Care | Payer: Medicare HMO | Source: Ambulatory Visit | Attending: Family Medicine | Admitting: Family Medicine

## 2016-09-22 DIAGNOSIS — Z1231 Encounter for screening mammogram for malignant neoplasm of breast: Secondary | ICD-10-CM

## 2016-11-03 DIAGNOSIS — R69 Illness, unspecified: Secondary | ICD-10-CM | POA: Diagnosis not present

## 2016-11-11 DIAGNOSIS — R69 Illness, unspecified: Secondary | ICD-10-CM | POA: Diagnosis not present

## 2016-11-24 DIAGNOSIS — H5213 Myopia, bilateral: Secondary | ICD-10-CM | POA: Diagnosis not present

## 2016-11-24 DIAGNOSIS — H524 Presbyopia: Secondary | ICD-10-CM | POA: Diagnosis not present

## 2016-11-24 DIAGNOSIS — H52203 Unspecified astigmatism, bilateral: Secondary | ICD-10-CM | POA: Diagnosis not present

## 2017-01-28 DIAGNOSIS — L570 Actinic keratosis: Secondary | ICD-10-CM | POA: Diagnosis not present

## 2017-01-28 DIAGNOSIS — D2372 Other benign neoplasm of skin of left lower limb, including hip: Secondary | ICD-10-CM | POA: Diagnosis not present

## 2017-01-28 DIAGNOSIS — L814 Other melanin hyperpigmentation: Secondary | ICD-10-CM | POA: Diagnosis not present

## 2017-01-28 DIAGNOSIS — D2261 Melanocytic nevi of right upper limb, including shoulder: Secondary | ICD-10-CM | POA: Diagnosis not present

## 2017-01-28 DIAGNOSIS — Z85828 Personal history of other malignant neoplasm of skin: Secondary | ICD-10-CM | POA: Diagnosis not present

## 2017-01-28 DIAGNOSIS — L821 Other seborrheic keratosis: Secondary | ICD-10-CM | POA: Diagnosis not present

## 2017-01-28 DIAGNOSIS — L918 Other hypertrophic disorders of the skin: Secondary | ICD-10-CM | POA: Diagnosis not present

## 2017-01-28 DIAGNOSIS — D225 Melanocytic nevi of trunk: Secondary | ICD-10-CM | POA: Diagnosis not present

## 2017-01-28 DIAGNOSIS — D2262 Melanocytic nevi of left upper limb, including shoulder: Secondary | ICD-10-CM | POA: Diagnosis not present

## 2017-03-01 ENCOUNTER — Other Ambulatory Visit: Payer: Self-pay | Admitting: Family Medicine

## 2017-03-02 NOTE — Telephone Encounter (Signed)
CPE scheduled on 07/06/17, last filled on 07/15/16 #90 tabs with 3 additional refills

## 2017-03-02 NOTE — Telephone Encounter (Signed)
Will refill electronically  

## 2017-03-11 ENCOUNTER — Ambulatory Visit (INDEPENDENT_AMBULATORY_CARE_PROVIDER_SITE_OTHER): Payer: Medicare HMO | Admitting: Certified Nurse Midwife

## 2017-03-11 ENCOUNTER — Other Ambulatory Visit: Payer: Self-pay

## 2017-03-11 ENCOUNTER — Encounter: Payer: Self-pay | Admitting: Certified Nurse Midwife

## 2017-03-11 VITALS — BP 120/80 | HR 70 | Resp 16 | Ht 65.25 in | Wt 153.0 lb

## 2017-03-11 DIAGNOSIS — Z01411 Encounter for gynecological examination (general) (routine) with abnormal findings: Secondary | ICD-10-CM

## 2017-03-11 DIAGNOSIS — N951 Menopausal and female climacteric states: Secondary | ICD-10-CM

## 2017-03-11 DIAGNOSIS — N644 Mastodynia: Secondary | ICD-10-CM | POA: Diagnosis not present

## 2017-03-11 DIAGNOSIS — Z01419 Encounter for gynecological examination (general) (routine) without abnormal findings: Secondary | ICD-10-CM | POA: Diagnosis not present

## 2017-03-11 NOTE — Progress Notes (Signed)
Spoke with Tabitha Carlson at [facility]. Patient scheduled for dx MMG and [place] of right breast on 03/16/17 arriving at 9:30 am for 9:50 am appointment. Patient verbalizes understanding and is agreeable.

## 2017-03-11 NOTE — Progress Notes (Signed)
72 y.o. [id number] Married  Caucasian Fe here for annual exam. Menopausal , denies vaginal bleeding. Vaginal dryness managed with  Coconut oil use. Sees Dr.Towers for aex yearly for labs, Cholesterol and anxiety. Has noted some right breast pain that has not resolved for about the last 2 weeks. Denies lifting, or new exercise, nipple discharge or masses or skin change. Please check. Spouse recent back surgery for skin cancer. No other health concerns today.  No LMP recorded. Patient has had a hysterectomy.          Sexually active: Yes.    The current method of family planning is status post hysterectomy.    Exercising: Yes.    exercise Smoker:  no  Health Maintenance: Pap:  4/03 neg History of Abnormal Pap: no MMG:  09-22-16 category c density birads 1:neg Self Breast exams: yes Colonoscopy:  12/16 f/u 41yrs BMD:   2017  Due this year TDaP:  2012 Shingles: 2016 Pneumonia: 2017 Hep C and HIV: Hep c neg 2017 Labs: with PCP   reports that  has never smoked. she has never used smokeless tobacco. She reports that she does not drink alcohol or use drugs.  Past Medical History:  Diagnosis Date  . Allergic rhinitis   . Anxiety   . Arthritis    in back. Physical Therapy 2016  . Blood donor   . Cancer ([place])    skin on back  . Depression   . DJD (degenerative joint disease)   . Family history of ischemic heart disease   . Gastroparesis   . GERD (gastroesophageal reflux disease)   . Hx of colonic polyps   . Hypercholesteremia   . Hyperlipidemia   . IBS (irritable bowel syndrome)     Past Surgical History:  Procedure Laterality Date  . ABDOMINAL HYSTERECTOMY  1985   TAH/BSO Age 11 DUB, pelvic pain  . BREAST BIOPSY  1980, 1983   Benign Breast Biospy x 2  . CYST EXCISION  04/2011   Thigh  . left breast biopsy     benign x 2  . SKIN CANCER EXCISION     on back  . SKIN SURGERY     removal of small skin on face    Current Outpatient Medications  Medication Sig Dispense Refill   . acetaminophen (TYLENOL) 325 MG tablet Take 650 mg by mouth every 6 (six) hours as needed.    . ALPRAZolam (XANAX) 0.5 MG tablet TAKE 1 TABLET BY MOUTH 3 TIMES A DAY AS NEEDED 90 tablet 3  . atorvastatin (LIPITOR) 20 MG tablet TAKE 1 TABLET EVERY DAY 90 tablet 0  . calcium gluconate 500 MG tablet Take 500 mg by mouth daily.    [name] Sodium 30-100 MG CAPS Take 2 capsules by mouth daily.    . Cholecalciferol (VITAMIN D) 2000 UNITS CAPS Take 1 capsule by mouth daily.    . Fish Oil-Krill Oil CAPS Take 1 capsule by mouth daily.    . Ibuprofen (ADVIL PO) Take by mouth as needed.    . Multiple Vitamins-Minerals (MULTIPLE VITAMINS/WOMENS PO) Take 1 tablet by mouth daily.    Marland Kitchen OVER THE COUNTER MEDICATION Fiber gummies    . pantoprazole (PROTONIX) 40 MG tablet TAKE 1 TABLET EVERY DAY 90 tablet 0   No current facility-administered medications for this visit.     Family History  Problem Relation Age of Onset  . Leukemia Mother   . Hyperlipidemia Sister   . Other Sister  2 cardiac episodes  . Colon cancer Neg Hx     ROS:  Pertinent items are noted in HPI.  Otherwise, a comprehensive ROS was negative.  Exam:   BP 120/80   Pulse 70   Resp 16   Ht 5' 5.25" (1.657 m)   Wt 153 lb (69.4 kg)   BMI 25.27 kg/m  Height: 5' 5.25" (165.7 cm) Ht Readings from Last 3 Encounters:  03/11/17 5' 5.25" (1.657 m)  06/29/16 5' 5.25" (1.657 m)  06/18/16 5' 5.25" (1.657 m)    General appearance: alert, cooperative and appears stated age Head: Normocephalic, without obvious abnormality, atraumatic Neck: no adenopathy, supple, symmetrical, trachea midline and thyroid normal to inspection and palpation Lungs: clear to auscultation bilaterally Breasts: normal appearance, no masses or tenderness, No nipple retraction or dimpling, No nipple discharge or bleeding, No axillary or supraclavicular adenopathy, right breast upper outer quadrant tenderness into axilla, no masses palpated or  redness. Heart: regular rate and rhythm Abdomen: soft, non-tender; no masses,  no organomegaly Extremities: extremities normal, atraumatic, no cyanosis or edema Skin: Skin color, texture, turgor normal. No rashes or lesions Lymph nodes: Cervical, supraclavicular, and axillary nodes normal. No abnormal inguinal nodes palpated Neurologic: Grossly normal   Pelvic: External genitalia:  no lesions, atrophic appearance              Urethra:  normal appearing urethra with no masses, tenderness or lesions              Bartholin's and Skene's: normal                 Vagina: normal appearing vagina with normal color and discharge, no lesions              Cervix: absent              Pap taken: No. Bimanual Exam:  Uterus:  uterus absent              Adnexa: no mass, fullness, tenderness and adnexa bilateral surgically absent               Rectovaginal: Confirms               Anus:  normal sphincter tone, no lesions  Chaperone present: yes  A:  Well Woman with normal exam  Menopausal no HRT S/P TAH BSO  Right Breast tenderness  Cholesterol/anxiety management with MD  P:   Reviewed health and wellness pertinent to exam  Aware of need to advise if vaginal bleeding or dryness change  Discussed no masses noted, but still tender and feel diagnostic mammogram and [place] needed. Patient relieved we are progressing with the evaluation. Patient will be scheduled prior to leaving.  Pap smear: no   counseled on breast self exam, mammography screening, feminine hygiene, osteoporosis, adequate intake of calcium and vitamin D, diet and exercise  return annually or prn  An After Visit Summary was printed and given to the patient.

## 2017-03-11 NOTE — Patient Instructions (Signed)

## 2017-03-16 ENCOUNTER — Ambulatory Visit: Payer: Medicare HMO

## 2017-03-16 ENCOUNTER — Ambulatory Visit
Admission: RE | Admit: 2017-03-16 | Discharge: 2017-03-16 | Disposition: A | Discharge status: Home / Self Care | Payer: Medicare HMO | Source: Ambulatory Visit | Attending: Certified Nurse Midwife | Admitting: Certified Nurse Midwife

## 2017-03-16 DIAGNOSIS — N644 Mastodynia: Secondary | ICD-10-CM

## 2017-03-16 DIAGNOSIS — R922 Inconclusive mammogram: Secondary | ICD-10-CM | POA: Diagnosis not present

## 2017-03-17 ENCOUNTER — Telehealth: Payer: Self-pay | Admitting: *Deleted

## 2017-03-17 NOTE — Telephone Encounter (Signed)
Notes recorded by Burnice Logan, RN on 03/17/2017 at 11:15 AM EST Left message to call Sharee Pimple at [phone].

## 2017-03-17 NOTE — Telephone Encounter (Signed)
-----   Message from Regina Eck, CNM sent at 03/16/2017  4:56 PM EST ----- Mammogram reviewed with no suspicious findings for malignancy in right breast.  Had mammogram for tenderness. If tenderness is resolving do not need follow up, if present she needs follow up here.

## 2017-03-17 NOTE — Telephone Encounter (Signed)
Patient returned call to Jill. °

## 2017-03-17 NOTE — Telephone Encounter (Signed)
Spoke with patient, advised as seen below per [name], CNM. Patient states she has been wearing a sports bra and tenderness is resolving. No OV scheduled. Patient aware to return call for OV if problem does not resolve.   Routing to provider for final review. Patient is agreeable to disposition. Will close encounter.

## 2017-03-26 DIAGNOSIS — Z0100 Encounter for examination of eyes and vision without abnormal findings: Secondary | ICD-10-CM | POA: Diagnosis not present

## 2017-05-06 DIAGNOSIS — R69 Illness, unspecified: Secondary | ICD-10-CM | POA: Diagnosis not present

## 2017-06-21 ENCOUNTER — Other Ambulatory Visit: Payer: Self-pay | Admitting: *Deleted

## 2017-06-21 MED ORDER — PANTOPRAZOLE SODIUM 40 MG PO TBEC: 40.0000 mg | DELAYED_RELEASE_TABLET | Freq: Every day | ORAL | 1 refills | Status: DC

## 2017-06-21 MED ORDER — ATORVASTATIN CALCIUM 20 MG PO TABS: 20.0000 mg | ORAL_TABLET | Freq: Every day | ORAL | 1 refills | Status: DC

## 2017-06-28 ENCOUNTER — Telehealth: Payer: Self-pay | Admitting: Family Medicine

## 2017-06-28 DIAGNOSIS — E7800 Pure hypercholesterolemia, unspecified: Secondary | ICD-10-CM

## 2017-06-28 DIAGNOSIS — Z0000 Encounter for general adult medical examination without abnormal findings: Secondary | ICD-10-CM

## 2017-06-28 DIAGNOSIS — Z0100 Encounter for examination of eyes and vision without abnormal findings: Secondary | ICD-10-CM | POA: Diagnosis not present

## 2017-06-28 NOTE — Telephone Encounter (Signed)
-----   Message from Eustace Pen, LPN sent at 4/38/3818  2:46 PM EDT ----- Regarding: Labs 5/21 Lab orders needed. Thank you.  Insurance:  [employer]

## 2017-06-29 ENCOUNTER — Telehealth: Payer: Self-pay

## 2017-06-29 ENCOUNTER — Ambulatory Visit (INDEPENDENT_AMBULATORY_CARE_PROVIDER_SITE_OTHER): Payer: Medicare HMO

## 2017-06-29 VITALS — BP 124/70 | HR 89 | Temp 98.0°F | Ht 65.5 in | Wt 149.5 lb

## 2017-06-29 DIAGNOSIS — E7800 Pure hypercholesterolemia, unspecified: Secondary | ICD-10-CM | POA: Diagnosis not present

## 2017-06-29 DIAGNOSIS — Z0000 Encounter for general adult medical examination without abnormal findings: Secondary | ICD-10-CM

## 2017-06-29 LAB — LIPID PANEL
CHOLESTEROL: 153 mg/dL (ref 0–200)
HDL: 58.4 mg/dL (ref >39.00)
LDL Cholesterol: 77 mg/dL (ref 0–99)
NONHDL: 94.65
Total CHOL/HDL Ratio: 3
Triglycerides: 86 mg/dL (ref 0.0–149.0)
VLDL: 17.2 mg/dL (ref 0.0–40.0)

## 2017-06-29 LAB — COMPREHENSIVE METABOLIC PANEL
ALT: 20 U/L (ref 0–35)
AST: 20 U/L (ref 0–37)
Albumin: 4.5 g/dL (ref 3.5–5.2)
Alkaline Phosphatase: 60 U/L (ref 39–117)
BILIRUBIN TOTAL: 0.5 mg/dL (ref 0.2–1.2)
BUN: 15 mg/dL (ref 6–23)
CO2: 30 mEq/L (ref 19–32)
CREATININE: 0.91 mg/dL (ref 0.40–1.20)
Calcium: 9.9 mg/dL (ref 8.4–10.5)
Chloride: 103 mEq/L (ref 96–112)
GFR: 64.87 mL/min (ref >60.00)
GLUCOSE: 100 mg/dL — AB (ref 70–99)
Potassium: 4.3 mEq/L (ref 3.5–5.1)
SODIUM: 141 meq/L (ref 135–145)
Total Protein: 7.4 g/dL (ref 6.0–8.3)

## 2017-06-29 LAB — CBC WITH DIFFERENTIAL/PLATELET
Basophils Absolute: 0 10*3/uL (ref 0.0–0.1)
Basophils Relative: 0.7 % (ref 0.0–3.0)
Eosinophils Absolute: 0.2 10*3/uL (ref 0.0–0.7)
Eosinophils Relative: 4.5 % (ref 0.0–5.0)
HCT: 41.2 % (ref 36.0–46.0)
Hemoglobin: 13.8 g/dL (ref 12.0–15.0)
LYMPHS ABS: 2 10*3/uL (ref 0.7–4.0)
Lymphocytes Relative: 43.8 % (ref 12.0–46.0)
MCHC: 33.6 g/dL (ref 30.0–36.0)
MCV: 90.5 fl (ref 78.0–100.0)
MONO ABS: 0.4 10*3/uL (ref 0.1–1.0)
MONOS PCT: 8.2 % (ref 3.0–12.0)
NEUTROS ABS: 2 10*3/uL (ref 1.4–7.7)
NEUTROS PCT: 42.8 % — AB (ref 43.0–77.0)
PLATELETS: 183 10*3/uL (ref 150.0–400.0)
RBC: 4.55 Mil/uL (ref 3.87–5.11)
RDW: 13.1 % (ref 11.5–15.5)
WBC: 4.6 10*3/uL (ref 4.0–10.5)

## 2017-06-29 LAB — TSH: TSH: 2.81 u[IU]/mL (ref 0.35–4.50)

## 2017-06-29 MED ORDER — HYDROCORTISONE ACETATE 25 MG RE SUPP: 25.0000 mg | Freq: Every day | RECTAL | 0 refills | Status: DC

## 2017-06-29 NOTE — Progress Notes (Signed)
Subjective:   Tabitha Carlson is a 72 y.o. female who presents for Medicare Annual (Subsequent) preventive examination.  Review of Systems:  N/A Cardiac Risk Factors include: advanced age (>66men, >41 women);dyslipidemia     Objective:     Vitals: BP 124/70 (BP Location: Right Arm, Patient Position: Sitting, Cuff Size: Normal)   Pulse 89   Temp 98 F (36.7 C) (Oral)   Ht 5' 5.5" (1.664 m) Comment: no shoes  Wt 149 lb 8 oz (67.8 kg)   SpO2 98%   BMI 24.50 kg/m   Body mass index is 24.5 kg/m.  Advanced Directives 06/29/2017 06/18/2016 06/18/2015 12/05/2014  Does Patient Have a Medical Advance Directive? No No No No  Does patient want to make changes to medical advance directive? Yes (MAU/Ambulatory/Procedural Areas - Information given) - - -  Would patient like information on creating a medical advance directive? - - Yes - Educational materials given No - patient declined information    Tobacco Social History   Tobacco Use  Smoking Status Never Smoker  Smokeless Tobacco Never Used     Counseling given: No   Clinical Intake:  Pre-visit preparation completed: Yes  Pain : No/denies pain Pain Score: 0-No pain     Nutritional Status: BMI of 19-24  Normal Nutritional Risks: None Diabetes: No  How often do you need to have someone help you when you read instructions, pamphlets, or other written materials from your doctor or pharmacy?: 1 - Never What is the last grade level you completed in school?: 12th grade  Interpreter Needed?: No  Comments: pt lives with spouse Information entered by :: LPinson, LPN  Past Medical History:  Diagnosis Date  . Allergic rhinitis   . Anxiety   . Arthritis    in back. Physical Therapy 2016  . Blood donor   . Cancer ([place])    skin on back  . Depression   . DJD (degenerative joint disease)   . Family history of ischemic heart disease   . Gastroparesis   . GERD (gastroesophageal reflux disease)   . Hx of colonic polyps   .  Hypercholesteremia   . Hyperlipidemia   . IBS (irritable bowel syndrome)    Past Surgical History:  Procedure Laterality Date  . ABDOMINAL HYSTERECTOMY  1985   TAH/BSO Age 37 DUB, pelvic pain  . BREAST BIOPSY  1980, 1983   Benign Breast Biospy x 2  . CYST EXCISION  04/2011   Thigh  . left breast biopsy     benign x 2  . SKIN CANCER EXCISION     on back  . SKIN SURGERY     removal of small skin on face   Family History  Problem Relation Age of Onset  . Leukemia Mother   . Hyperlipidemia Sister   . Other Sister        2 cardiac episodes  . Colon cancer Neg Hx    Social History   Socioeconomic History  . Marital status: Married    Spouse name: Printmaker  . Number of children: 2  . Years of education: Not on file  . Highest education level: Not on file  Occupational History  . Occupation: housewife  Social Needs  . Financial resource strain: Not on file  . Food insecurity:    Worry: Not on file    Inability: Not on file  . Transportation needs:    Medical: Not on file    Non-medical: Not on file  Tobacco Use  . Smoking status: Never Smoker  . Smokeless tobacco: Never Used  Substance and Sexual Activity  . Alcohol use: No    Alcohol/week: 0.0 oz  . Drug use: No  . Sexual activity: Yes    Partners: Male    Birth control/protection: Surgical    Comment: Hysterectomy  Lifestyle  . Physical activity:    Days per week: Not on file    Minutes per session: Not on file  . Stress: Not on file  Relationships  . Social connections:    Talks on phone: Not on file    Gets together: Not on file    Attends religious service: Not on file    Active member of club or organization: Not on file    Attends meetings of clubs or organizations: Not on file    Relationship status: Not on file  Other Topics Concern  . Not on file  Social History Narrative  . Not on file    Outpatient Encounter Medications as of 06/29/2017  Medication Sig  . acetaminophen (TYLENOL) 325  MG tablet Take 650 mg by mouth every 6 (six) hours as needed.  . ALPRAZolam (XANAX) 0.5 MG tablet TAKE 1 TABLET BY MOUTH 3 TIMES A DAY AS NEEDED  . atorvastatin (LIPITOR) 20 MG tablet Take 1 tablet (20 mg total) by mouth daily.  . calcium gluconate 500 MG tablet Take 500 mg by mouth daily.  [name] Sodium 30-100 MG CAPS Take 2 capsules by mouth daily.  . Cholecalciferol (VITAMIN D) 2000 UNITS CAPS Take 1 capsule by mouth daily.  . Fish Oil-Krill Oil CAPS Take 1 capsule by mouth daily.  . Ibuprofen (ADVIL PO) Take by mouth as needed.  . Multiple Vitamins-Minerals (MULTIPLE VITAMINS/WOMENS PO) Take 1 tablet by mouth daily.  Marland Kitchen OVER THE COUNTER MEDICATION Fiber gummies  . pantoprazole (PROTONIX) 40 MG tablet Take 1 tablet (40 mg total) by mouth daily.   No facility-administered encounter medications on file as of 06/29/2017.     Activities of Daily Living In your present state of health, do you have any difficulty performing the following activities: 06/29/2017  Hearing? N  Vision? N  Difficulty concentrating or making decisions? N  Walking or climbing stairs? N  Dressing or bathing? N  Doing errands, shopping? N  Preparing Food and eating ? N  Using the Toilet? N  In the past six months, have you accidently leaked urine? N  Do you have problems with loss of bowel control? N  Managing your Medications? N  Managing your Finances? N  Housekeeping or managing your Housekeeping? N  Some recent data might be hidden    Patient Care Team: Tower, Wynelle Fanny, MD as PCP - General (Family Medicine) Ralene Bathe, MD as Consulting Physician (Ophthalmology) Regina Eck, CNM as Referring Physician (Certified Nurse Midwife)    Assessment:   This is a routine wellness examination for [place].   Hearing Screening   125Hz  250Hz  500Hz  1000Hz  2000Hz  3000Hz  4000Hz  6000Hz  8000Hz   Right ear:   40 40 40  40    Left ear:   40 40 40  40    Vision Screening Comments: Vision exam  with Sept 2018 with Dr. Claudean Kinds   Exercise Activities and Dietary recommendations Current Exercise Habits: Structured exercise class, Type of exercise: calisthenics, Time (Minutes): 45, Frequency (Times/Week): 4, Weekly Exercise (Minutes/Week): 180, Intensity: Moderate  Goals    . Increase physical activity     Starting 06/29/2017, I will  continue to exercise for at least 45 min 4 days per week.        Fall Risk Fall Risk  06/29/2017 06/18/2016 06/18/2015 06/04/2015 05/18/2014  Falls in the past year? No Yes Yes Yes No  Comment - pt reports bruising related to fall; fall occurred when patient slipped while getting out of shower - - -  Number falls in past yr: - 1 1 1  -  Injury with Fall? - Yes Yes Yes -  Risk Factor Category  - - - High Fall Risk -  Follow up - Falls prevention discussed Falls evaluation completed - -  Comment - pt verbalized she will always put down a towel on bathroom floor to reduce risk of falls - - -   Depression Screen PHQ 2/9 Scores 06/29/2017 06/18/2016 06/18/2015 06/04/2015  PHQ - 2 Score 0 0 0 0  PHQ- 9 Score 0 - - -     Cognitive Function MMSE - Mini Mental State Exam 06/29/2017 06/18/2016 06/18/2015  Orientation to time 5 5 5   Orientation to Place 5 5 5   Registration 3 3 3   Attention/ Calculation 0 0 0  Recall 3 3 3   Language- name 2 objects 0 0 0  Language- repeat 1 1 1   Language- follow 3 step command 3 3 3   Language- read & follow direction 0 0 0  Write a sentence 0 0 0  Copy design 0 0 0  Total score 20 20 20      PLEASE NOTE: A Mini-Cog screen was completed. Maximum score is 20. A value of 0 denotes this part of Folstein MMSE was not completed or the patient failed this part of the Mini-Cog screening.   Mini-Cog Screening Orientation to Time - Max 5 pts Orientation to Place - Max 5 pts Registration - Max 3 pts Recall - Max 3 pts Language Repeat - Max 1 pts Language Follow 3 Step Command - Max 3 pts     Immunization History  Administered  Date(s) Administered  . Influenza Split 11/01/2010, 10/23/2011  . Influenza Whole 10/10/2009  . Influenza, High Dose Seasonal PF 10/23/2014, 11/11/2016  . Influenza, Seasonal, Injecte, Preservative Fre 11/14/2015  . Influenza,inj,Quad PF,6+ Mos 10/26/2012, 10/20/2013  . Pneumococcal Conjugate-13 06/04/2015  . Pneumococcal Polysaccharide-23 10/20/2013  . Td 03/04/2000, 03/20/2010  . Zoster 12/11/2014    Screening Tests Health Maintenance  Topic Date Due  . INFLUENZA VACCINE  09/09/2017  . MAMMOGRAM  09/22/2017  . TETANUS/TDAP  03/20/2020  . COLONOSCOPY  12/18/2024  . DEXA SCAN  Completed  . Hepatitis C Screening  Completed  . PNA vac Low Risk Adult  Completed      Plan:     I have personally reviewed, addressed, and noted the following in the patient's chart:  A. Medical and social history B. Use of alcohol, tobacco or illicit drugs  C. Current medications and supplements D. Functional ability and status E.  Nutritional status F.  Physical activity G. Advance directives H. List of other physicians I.  Hospitalizations, surgeries, and ER visits in previous 12 months J.  Jeff to include hearing, vision, cognitive, depression L. Referrals and appointments - none  In addition, I have reviewed and discussed with patient certain preventive protocols, quality metrics, and best practice recommendations. A written personalized care plan for preventive services as well as general preventive health recommendations were provided to patient.  See attached scanned questionnaire for additional information.   Signed,   Lindell Noe, MHA, BS,  LPN Health Coach

## 2017-06-29 NOTE — Telephone Encounter (Signed)
I wrote for anusol hc suppository 1 PR qhs for 7 days  Pended-please send to [facility] pharmacy Thanks

## 2017-06-29 NOTE — Telephone Encounter (Signed)
What symptoms is she having?

## 2017-06-29 NOTE — Telephone Encounter (Signed)
Patient in office today for AWV. Patient reports concerns with an internal hemorrhoid. Patient has been using [employer]	[place] and suppositories but states these have been ineffective. Patient is requesting PCP prescribe something that will be more effective.

## 2017-06-29 NOTE — Telephone Encounter (Signed)
Pt notified Rx sent and to update [place] if no improvement in sxs after using suppositories

## 2017-06-29 NOTE — Progress Notes (Signed)
PCP notes:   Health maintenance:  No gaps identified.  Abnormal screenings:   None  Patient concerns:   None  Nurse concerns:  None  Next PCP appt:   07/06/17 @ 1015  I reviewed health advisor's note, was available for consultation, and agree with documentation and plan. Loura Pardon MD

## 2017-06-29 NOTE — Telephone Encounter (Signed)
Pt told Lattie Haw that is his having rectal pain and discomfort, it feels like she has an inflamed internal hemorrhoid but no itching

## 2017-06-29 NOTE — Patient Instructions (Signed)
Ms. Haydu , Thank you for taking time to come for your Medicare Wellness Visit. I appreciate your ongoing commitment to your health goals. Please review the following plan we discussed and let me know if I can assist you in the future.   These are the goals we discussed: Goals    . Increase physical activity     Starting 06/29/2017, I will continue to exercise for at least 45 min 4 days per week.        This is a list of the screening recommended for you and due dates:  Health Maintenance  Topic Date Due  . Flu Shot  09/09/2017  . Mammogram  09/22/2017  . Tetanus Vaccine  03/20/2020  . Colon Cancer Screening  12/18/2024  . DEXA scan (bone density measurement)  Completed  .  Hepatitis C: One time screening is recommended by Center for Disease Control  (CDC) for  adults born from [birth year] through 1964.   Completed  . Pneumonia vaccines  Completed   Preventive Care for Adults  A healthy lifestyle and preventive care can promote health and wellness. Preventive health guidelines for adults include the following key practices.  . A routine yearly physical is a good way to check with your health care provider about your health and preventive screening. It is a chance to share any concerns and updates on your health and to receive a thorough exam.  . Visit your dentist for a routine exam and preventive care every 6 months. Brush your teeth twice a day and floss once a day. Good oral hygiene prevents tooth decay and gum disease.  . The frequency of eye exams is based on your age, health, family medical history, use  of contact lenses, and other factors. Follow your health care provider's recommendations for frequency of eye exams.  . Eat a healthy diet. Foods like vegetables, fruits, whole grains, low-fat dairy products, and lean protein foods contain the nutrients you need without too many calories. Decrease your intake of foods high in solid fats, added sugars, and salt. Eat the right amount of  calories for you. Get information about a proper diet from your health care provider, if necessary.  . Regular physical exercise is one of the most important things you can do for your health. Most adults should get at least 150 minutes of moderate-intensity exercise (any activity that increases your heart rate and causes you to sweat) each week. In addition, most adults need muscle-strengthening exercises on 2 or more days a week.  Silver Sneakers may be a benefit available to you. To determine eligibility, you may visit the website: www.silversneakers.com or contact program at 567-398-7213 Mon-Fri between 8AM-8PM.   . Maintain a healthy weight. The body mass index (BMI) is a screening tool to identify possible weight problems. It provides an estimate of body fat based on height and weight. Your health care provider can find your BMI and can help you achieve or maintain a healthy weight.   For adults 20 years and older: ? A BMI below 18.5 is considered underweight. ? A BMI of 18.5 to 24.9 is normal. ? A BMI of 25 to 29.9 is considered overweight. ? A BMI of 30 and above is considered obese.   . Maintain normal blood lipids and cholesterol levels by exercising and minimizing your intake of saturated fat. Eat a balanced diet with plenty of fruit and vegetables. Blood tests for lipids and cholesterol should begin at age 95 and be repeated every  5 years. If your lipid or cholesterol levels are high, you are over 50, or you are at high risk for heart disease, you may need your cholesterol levels checked more frequently. Ongoing high lipid and cholesterol levels should be treated with medicines if diet and exercise are not working.  . If you smoke, find out from your health care provider how to quit. If you do not use tobacco, please do not start.  . If you choose to drink alcohol, please do not consume more than 2 drinks per day. One drink is considered to be 12 ounces (355 mL) of beer, 5 ounces  (148 mL) of wine, or 1.5 ounces (44 mL) of liquor.  . If you are 78-47 years old, ask your health care provider if you should take aspirin to prevent strokes.  . Use sunscreen. Apply sunscreen liberally and repeatedly throughout the day. You should seek shade when your shadow is shorter than you. Protect yourself by wearing long sleeves, pants, a wide-brimmed hat, and sunglasses year round, whenever you are outdoors.  . Once a month, do a whole body skin exam, using a mirror to look at the skin on your back. Tell your health care provider of new moles, moles that have irregular borders, moles that are larger than a pencil eraser, or moles that have changed in shape or color.

## 2017-07-06 ENCOUNTER — Ambulatory Visit (INDEPENDENT_AMBULATORY_CARE_PROVIDER_SITE_OTHER): Payer: Medicare HMO | Admitting: Family Medicine

## 2017-07-06 ENCOUNTER — Encounter: Payer: Self-pay | Admitting: Family Medicine

## 2017-07-06 VITALS — BP 116/64 | HR 72 | Temp 98.3°F | Ht 65.5 in | Wt 150.5 lb

## 2017-07-06 DIAGNOSIS — M85859 Other specified disorders of bone density and structure, unspecified thigh: Secondary | ICD-10-CM

## 2017-07-06 DIAGNOSIS — E7800 Pure hypercholesterolemia, unspecified: Secondary | ICD-10-CM

## 2017-07-06 DIAGNOSIS — Z0000 Encounter for general adult medical examination without abnormal findings: Secondary | ICD-10-CM

## 2017-07-06 MED ORDER — ATORVASTATIN CALCIUM 20 MG PO TABS: 20.0000 mg | ORAL_TABLET | Freq: Every day | ORAL | 3 refills | Status: DC

## 2017-07-06 NOTE — Assessment & Plan Note (Signed)
Reviewed health habits including diet and exercise and skin cancer prevention Reviewed appropriate screening tests for age  Also reviewed health mt list, fam hx and immunization status , as well as social and family history   See HPI amw rev  Labs rev  Disc getting on wait list for shingrix Dexa/mammo in aug  Keep phys/mentally active

## 2017-07-06 NOTE — Progress Notes (Signed)
Subjective:    Patient ID: Tabitha Carlson, female    DOB: 1945/09/06, 72 y.o.   MRN: [id number]  HPI Here for health maintenance exam and to review chronic medical problems   Recent hemorrhoids  -anusol hc PR helped   Doing well otherwise   Had amw 5/21-no gaps or concerns  A little irritation - under R arm/no rash  Comes and goes    Wt Readings from Last 3 Encounters:  07/06/17 150 lb 8 oz (68.3 kg)  06/29/17 149 lb 8 oz (67.8 kg)  03/11/17 153 lb (69.4 kg)  taking care of herself  Goes to the gym 4 times per week for aerobics  Eating healthy  24.66 kg/m  Mammogram 8/18, then diag in feb- both neg  Self breast exam - no lumps /just skin irritation under R arm   Colonoscopy 11/16 nl wit 10 y recall   dexa 6/17-osteopenia in fn On ca and D- still taking  Exercises  No falls or fx  Wants to do that in aug with her mammogram   Hyperlipidemia Lab Results  Component Value Date   CHOL 153 06/29/2017   CHOL 172 06/18/2016   CHOL 165 05/21/2015   Lab Results  Component Value Date   HDL 58.40 06/29/2017   HDL 64.90 06/18/2016   HDL 57.60 05/21/2015   Lab Results  Component Value Date   LDLCALC 77 06/29/2017   LDLCALC 86 06/18/2016   LDLCALC 85 05/21/2015   Lab Results  Component Value Date   TRIG 86.0 06/29/2017   TRIG 104.0 06/18/2016   TRIG 115.0 05/21/2015   Lab Results  Component Value Date   CHOLHDL 3 06/29/2017   CHOLHDL 3 06/18/2016   CHOLHDL 3 05/21/2015   No results found for: LDLDIRECT Atorvastatin and diet  Well controlled  Diet is good   Lab Results  Component Value Date   CREATININE 0.91 06/29/2017   BUN 15 06/29/2017   NA 141 06/29/2017   K 4.3 06/29/2017   CL 103 06/29/2017   CO2 30 06/29/2017   Lab Results  Component Value Date   ALT 20 06/29/2017   AST 20 06/29/2017   ALKPHOS 60 06/29/2017   BILITOT 0.5 06/29/2017    Lab Results  Component Value Date   WBC 4.6 06/29/2017   HGB 13.8 06/29/2017   HCT 41.2 06/29/2017    MCV 90.5 06/29/2017   PLT 183.0 06/29/2017    Lab Results  Component Value Date   TSH 2.81 06/29/2017     Zoster vaccine 11/16   Patient Active Problem List   Diagnosis Date Noted  . Medicare annual wellness visit, subsequent 06/29/2017  . Estrogen deficiency 06/04/2015  . Routine general medical examination at a health care facility 05/18/2015  . Encounter for Medicare annual wellness exam 05/18/2014  . Osteopenia 10/20/2013  . GASTROPARESIS 01/02/2008  . ALLERGIC RHINITIS 09/12/2007  . COLONIC POLYPS 03/16/2007  . GERD 03/16/2007  . Hyperlipidemia 02/21/2007  . Anxiety state 02/21/2007  . IBS 02/21/2007  . DEGENERATIVE JOINT DISEASE 02/21/2007   Past Medical History:  Diagnosis Date  . Allergic rhinitis   . Anxiety   . Arthritis    in back. Physical Therapy 2016  . Blood donor   . Cancer ([place])    skin on back  . Depression   . DJD (degenerative joint disease)   . Family history of ischemic heart disease   . Gastroparesis   . GERD (gastroesophageal reflux disease)   .  Hx of colonic polyps   . Hypercholesteremia   . Hyperlipidemia   . IBS (irritable bowel syndrome)    Past Surgical History:  Procedure Laterality Date  . ABDOMINAL HYSTERECTOMY  1985   TAH/BSO Age 89 DUB, pelvic pain  . BREAST BIOPSY  1980, 1983   Benign Breast Biospy x 2  . CYST EXCISION  04/2011   Thigh  . left breast biopsy     benign x 2  . SKIN CANCER EXCISION     on back  . SKIN SURGERY     removal of small skin on face   Social History   Tobacco Use  . Smoking status: Never Smoker  . Smokeless tobacco: Never Used  Substance Use Topics  . Alcohol use: No    Alcohol/week: 0.0 oz  . Drug use: No   Family History  Problem Relation Age of Onset  . Leukemia Mother   . Hyperlipidemia Sister   . Other Sister        2 cardiac episodes  . Colon cancer Neg Hx    Allergies  Allergen Reactions  . Aspirin     Unable to take a full aspirin--causes palpitations Tachycardia  .  Crab [Shellfish Allergy]     Rash from head to toe   Current Outpatient Medications on File Prior to Visit  Medication Sig Dispense Refill  . acetaminophen (TYLENOL) 325 MG tablet Take 650 mg by mouth every 6 (six) hours as needed.    . ALPRAZolam (XANAX) 0.5 MG tablet TAKE 1 TABLET BY MOUTH 3 TIMES A DAY AS NEEDED 90 tablet 3  . calcium gluconate 500 MG tablet Take 500 mg by mouth daily.    [name] Sodium 30-100 MG CAPS Take 2 capsules by mouth daily.    . Cholecalciferol (VITAMIN D) 2000 UNITS CAPS Take 1 capsule by mouth daily.    . Fish Oil-Krill Oil CAPS Take 1 capsule by mouth daily.    . hydrocortisone (ANUSOL-HC) 25 MG suppository Place 1 suppository (25 mg total) rectally at bedtime. 7 suppository 0  . Ibuprofen (ADVIL PO) Take by mouth as needed.    . Multiple Vitamins-Minerals (MULTIPLE VITAMINS/WOMENS PO) Take 1 tablet by mouth daily.    Marland Kitchen OVER THE COUNTER MEDICATION Fiber gummies    . pantoprazole (PROTONIX) 40 MG tablet Take 1 tablet (40 mg total) by mouth daily. 90 tablet 1   No current facility-administered medications on file prior to visit.     Review of Systems  Constitutional: Negative for activity change, appetite change, fatigue, fever and unexpected weight change.  HENT: Negative for congestion, ear pain, rhinorrhea, sinus pressure and sore throat.   Eyes: Negative for pain, redness and visual disturbance.  Respiratory: Negative for cough, shortness of breath and wheezing.   Cardiovascular: Negative for chest pain and palpitations.  Gastrointestinal: Negative for abdominal pain, blood in stool, constipation and diarrhea.  Endocrine: Negative for polydipsia and polyuria.  Genitourinary: Negative for dysuria, frequency and urgency.  Musculoskeletal: Negative for arthralgias, back pain and myalgias.  Skin: Negative for pallor and rash.       Itching under R arm -no rash  Allergic/Immunologic: Negative for environmental allergies.  Neurological:  Negative for dizziness, syncope and headaches.  Hematological: Negative for adenopathy. Does not bruise/bleed easily.  Psychiatric/Behavioral: Negative for decreased concentration and dysphoric mood. The patient is not nervous/anxious.        Objective:   Physical Exam  Constitutional: She appears well-developed and well-nourished. No distress.  Well appearing   HENT:  Head: Normocephalic and atraumatic.  Right Ear: External ear normal.  Left Ear: External ear normal.  Mouth/Throat: Oropharynx is clear and moist.  Eyes: Pupils are equal, round, and reactive to light. Conjunctivae and EOM are normal. No scleral icterus.  Neck: Normal range of motion. Neck supple. No JVD present. Carotid bruit is not present. No thyromegaly present.  Cardiovascular: Normal rate, regular rhythm, normal heart sounds and intact distal pulses. Exam reveals no gallop.  Pulmonary/Chest: Effort normal and breath sounds normal. No respiratory distress. She has no wheezes. She exhibits no tenderness. No breast tenderness, discharge or bleeding.  Abdominal: Soft. Bowel sounds are normal. She exhibits no distension, no abdominal bruit and no mass. There is no tenderness.  Genitourinary: No breast tenderness, discharge or bleeding.  Genitourinary Comments: Goes to gyn- breast exam not done   Musculoskeletal: Normal range of motion. She exhibits no edema or tenderness.  Lymphadenopathy:    She has no cervical adenopathy.  Neurological: She is alert. She has normal reflexes. No cranial nerve deficit. She exhibits normal muscle tone. Coordination normal.  Skin: Skin is warm and dry. No rash noted. No erythema. No pallor.  Solar lentigines diffusely  No rash seen under arms   Psychiatric: She has a normal mood and affect.  pleasant          Assessment & Plan:   Problem List Items Addressed This Visit      Musculoskeletal and Integument   Osteopenia    Rev dexa 6/17 No falls or fx Good exercise  On ca and  D  Wants to schedule dexa with her mammogram in aug-she will call for ref at this time  Disc fall prev        Other   Hyperlipidemia    Disc goals for lipids and reasons to control them Rev last labs with pt Rev low sat fat diet in detail Well controlled with atorvastatin and diet       Relevant Medications   atorvastatin (LIPITOR) 20 MG tablet   Routine general medical examination at a health care facility - Primary    Reviewed health habits including diet and exercise and skin cancer prevention Reviewed appropriate screening tests for age  Also reviewed health mt list, fam hx and immunization status , as well as social and family history   See HPI amw rev  Labs rev  Disc getting on wait list for shingrix Dexa/mammo in aug  Keep phys/mentally active

## 2017-07-06 NOTE — Assessment & Plan Note (Signed)
Rev dexa 6/17 No falls or fx Good exercise  On ca and D  Wants to schedule dexa with her mammogram in aug-she will call for ref at this time  Disc fall prev

## 2017-07-06 NOTE — Assessment & Plan Note (Signed)
Disc goals for lipids and reasons to control them Rev last labs with pt Rev low sat fat diet in detail  Well controlled with atorvastatin and diet  

## 2017-07-06 NOTE — Patient Instructions (Signed)
If you are interested in the new shingles vaccine (Shingrix) - call your local pharmacy to check on coverage and availability  If interested - have your pharmacy get you on a wait list    Take care of yourself  Do be careful with razor for underarms  Try change to antiperspirant without fragrance (for sensitive skin)

## 2017-09-29 ENCOUNTER — Telehealth: Payer: Self-pay | Admitting: Family Medicine

## 2017-09-29 ENCOUNTER — Other Ambulatory Visit: Payer: Self-pay | Admitting: Certified Nurse Midwife

## 2017-09-29 ENCOUNTER — Other Ambulatory Visit: Payer: Self-pay | Admitting: Family Medicine

## 2017-09-29 DIAGNOSIS — Z1231 Encounter for screening mammogram for malignant neoplasm of breast: Secondary | ICD-10-CM

## 2017-09-29 DIAGNOSIS — E2839 Other primary ovarian failure: Secondary | ICD-10-CM

## 2017-09-29 NOTE — Telephone Encounter (Signed)
Pt is requesting bone density order. Last cpe 07/06/17 and last bone density was 07/22/15.  Copied from [place] [id number]. Topic: Referral - Request >> Sep 29, 2017 11:09 AM Reyne Dumas L wrote: Reason for CRM:   Pt states she is setting up her mammogram with [facility] and they have reminded her it is also time for her bone density test.  Pt states that [facility] can do the test they just need an order. Pt can be reached at [phone]

## 2017-09-29 NOTE — Telephone Encounter (Signed)
Order is done.

## 2017-09-29 NOTE — Telephone Encounter (Signed)
Pt notified order is in and she will [facility] back and schedule that too

## 2017-10-18 ENCOUNTER — Other Ambulatory Visit: Payer: Self-pay | Admitting: *Deleted

## 2017-10-18 MED ORDER — ALPRAZOLAM 0.5 MG PO TABS: 0.5000 mg | ORAL_TABLET | Freq: Three times a day (TID) | ORAL | 3 refills | Status: DC | PRN

## 2017-10-18 NOTE — Telephone Encounter (Signed)
Name of Medication: Xanax Name of Pharmacy: [facility] or Written Date and Quantity: 03/02/17 #90 tabs with 3 refills Last Office Visit and Type: CPE on 07/06/17 Next Office Visit and Type: AWV on 07/19/18 Last Controlled Substance Agreement Date: never done Last UDS: never done

## 2017-10-27 DIAGNOSIS — R69 Illness, unspecified: Secondary | ICD-10-CM | POA: Diagnosis not present

## 2017-11-08 DIAGNOSIS — R69 Illness, unspecified: Secondary | ICD-10-CM | POA: Diagnosis not present

## 2017-11-22 ENCOUNTER — Ambulatory Visit
Admission: RE | Admit: 2017-11-22 | Discharge: 2017-11-22 | Disposition: A | Discharge status: Home / Self Care | Payer: Medicare HMO | Source: Ambulatory Visit | Attending: Family Medicine | Admitting: Family Medicine

## 2017-11-22 DIAGNOSIS — Z1231 Encounter for screening mammogram for malignant neoplasm of breast: Secondary | ICD-10-CM | POA: Diagnosis not present

## 2017-11-22 DIAGNOSIS — E2839 Other primary ovarian failure: Secondary | ICD-10-CM

## 2017-11-22 DIAGNOSIS — M85852 Other specified disorders of bone density and structure, left thigh: Secondary | ICD-10-CM | POA: Diagnosis not present

## 2017-11-22 DIAGNOSIS — Z780 Asymptomatic menopausal state: Secondary | ICD-10-CM | POA: Diagnosis not present

## 2017-11-25 DIAGNOSIS — H524 Presbyopia: Secondary | ICD-10-CM | POA: Diagnosis not present

## 2017-11-25 DIAGNOSIS — H5213 Myopia, bilateral: Secondary | ICD-10-CM | POA: Diagnosis not present

## 2017-12-13 DIAGNOSIS — Z0001 Encounter for general adult medical examination with abnormal findings: Secondary | ICD-10-CM | POA: Diagnosis not present

## 2017-12-20 ENCOUNTER — Other Ambulatory Visit: Payer: Self-pay | Admitting: *Deleted

## 2017-12-20 MED ORDER — PANTOPRAZOLE SODIUM 40 MG PO TBEC: 40.0000 mg | DELAYED_RELEASE_TABLET | Freq: Every day | ORAL | 1 refills | Status: DC

## 2018-03-08 ENCOUNTER — Telehealth: Payer: Self-pay | Admitting: Family Medicine

## 2018-03-08 DIAGNOSIS — K3184 Gastroparesis: Secondary | ICD-10-CM

## 2018-03-08 NOTE — Telephone Encounter (Signed)
We have to explain what disables her from jury duty- what health problem prevents this for her ?

## 2018-03-08 NOTE — Telephone Encounter (Signed)
In your inbox.

## 2018-03-08 NOTE — Telephone Encounter (Signed)
Left VM requesting pt to call the office back 

## 2018-03-08 NOTE — Telephone Encounter (Signed)
Patient dropped off jury summons.  Patient needs a letter from [place] describing why she's unable to do jury duty.  Please call patient when letter is ready for pick up. Summons is in rx tower.

## 2018-03-09 NOTE — Telephone Encounter (Signed)
Pt returning your call

## 2018-03-09 NOTE — Telephone Encounter (Signed)
Letter done and in IN box 

## 2018-03-09 NOTE — Telephone Encounter (Signed)
Left VM letting pt know letter is ready for pick up

## 2018-03-09 NOTE — Telephone Encounter (Signed)
Pt has gastroparesis and has to eat every 3 hrs or she gets really nauseous, and then after she eats she has to move around to get her bowels going and then afterwards she is usually in the bathroom pretty quickly having BM so she is afraid to sit through this. Pt said when she was seeing GI regarding this issue they felt that she shouldn't participate in jury duty and would write her the letter. Pt said since it's managed and she doesn't need to f/u with GI anymore she is requesting Dr. Glori Bickers write the letter

## 2018-03-17 DIAGNOSIS — D2261 Melanocytic nevi of right upper limb, including shoulder: Secondary | ICD-10-CM | POA: Diagnosis not present

## 2018-03-17 DIAGNOSIS — L821 Other seborrheic keratosis: Secondary | ICD-10-CM | POA: Diagnosis not present

## 2018-03-17 DIAGNOSIS — D2272 Melanocytic nevi of left lower limb, including hip: Secondary | ICD-10-CM | POA: Diagnosis not present

## 2018-03-17 DIAGNOSIS — Z85828 Personal history of other malignant neoplasm of skin: Secondary | ICD-10-CM | POA: Diagnosis not present

## 2018-03-17 DIAGNOSIS — D2372 Other benign neoplasm of skin of left lower limb, including hip: Secondary | ICD-10-CM | POA: Diagnosis not present

## 2018-03-17 DIAGNOSIS — D225 Melanocytic nevi of trunk: Secondary | ICD-10-CM | POA: Diagnosis not present

## 2018-03-17 DIAGNOSIS — D2271 Melanocytic nevi of right lower limb, including hip: Secondary | ICD-10-CM | POA: Diagnosis not present

## 2018-03-17 DIAGNOSIS — L814 Other melanin hyperpigmentation: Secondary | ICD-10-CM | POA: Diagnosis not present

## 2018-03-17 DIAGNOSIS — L918 Other hypertrophic disorders of the skin: Secondary | ICD-10-CM | POA: Diagnosis not present

## 2018-03-17 DIAGNOSIS — D2262 Melanocytic nevi of left upper limb, including shoulder: Secondary | ICD-10-CM | POA: Diagnosis not present

## 2018-03-22 ENCOUNTER — Ambulatory Visit: Payer: Medicare HMO | Admitting: Certified Nurse Midwife

## 2018-05-15 ENCOUNTER — Other Ambulatory Visit: Payer: Self-pay | Admitting: Family Medicine

## 2018-05-17 NOTE — Telephone Encounter (Signed)
Name of Medication: Xanax Name of Pharmacy: [facility] or Written Date and Quantity: 10/18/17 #90 tabs with 3 refills Last Office Visit and Type: CPE on 07/06/17 Next Office Visit and Type: AWV on 07/19/18 CPE on 07/22/18 Last Controlled Substance Agreement Date: never done Last UDS: never done

## 2018-06-12 ENCOUNTER — Other Ambulatory Visit: Payer: Self-pay | Admitting: Family Medicine

## 2018-07-18 ENCOUNTER — Telehealth: Payer: Self-pay | Admitting: Family Medicine

## 2018-07-18 DIAGNOSIS — Z0000 Encounter for general adult medical examination without abnormal findings: Secondary | ICD-10-CM

## 2018-07-18 DIAGNOSIS — E7800 Pure hypercholesterolemia, unspecified: Secondary | ICD-10-CM

## 2018-07-18 NOTE — Telephone Encounter (Signed)
-----   Message from Cloyd Stagers, RT sent at 07/13/2018  1:46 PM EDT ----- Regarding: Lab Orders for Tuesday 6.9.2020 Please place lab orders for Tuesday 6.9.2020, [facility] physical on Friday 6.12.2020 Thank you, Dyke Maes RT(R)

## 2018-07-19 ENCOUNTER — Other Ambulatory Visit: Payer: Self-pay

## 2018-07-19 ENCOUNTER — Ambulatory Visit (INDEPENDENT_AMBULATORY_CARE_PROVIDER_SITE_OTHER): Payer: Medicare HMO

## 2018-07-19 ENCOUNTER — Other Ambulatory Visit (INDEPENDENT_AMBULATORY_CARE_PROVIDER_SITE_OTHER): Payer: Medicare HMO

## 2018-07-19 DIAGNOSIS — E7800 Pure hypercholesterolemia, unspecified: Secondary | ICD-10-CM | POA: Diagnosis not present

## 2018-07-19 DIAGNOSIS — Z0000 Encounter for general adult medical examination without abnormal findings: Secondary | ICD-10-CM | POA: Diagnosis not present

## 2018-07-19 LAB — CBC WITH DIFFERENTIAL/PLATELET
Basophils Absolute: 0 10*3/uL (ref 0.0–0.1)
Basophils Relative: 0.6 % (ref 0.0–3.0)
Eosinophils Absolute: 0.2 10*3/uL (ref 0.0–0.7)
Eosinophils Relative: 4 % (ref 0.0–5.0)
HCT: 41.7 % (ref 36.0–46.0)
Hemoglobin: 13.8 g/dL (ref 12.0–15.0)
Lymphocytes Relative: 41.7 % (ref 12.0–46.0)
Lymphs Abs: 1.9 10*3/uL (ref 0.7–4.0)
MCHC: 33.1 g/dL (ref 30.0–36.0)
MCV: 91.4 fl (ref 78.0–100.0)
Monocytes Absolute: 0.4 10*3/uL (ref 0.1–1.0)
Monocytes Relative: 7.9 % (ref 3.0–12.0)
Neutro Abs: 2.1 10*3/uL (ref 1.4–7.7)
Neutrophils Relative %: 45.8 % (ref 43.0–77.0)
Platelets: 192 10*3/uL (ref 150.0–400.0)
RBC: 4.57 Mil/uL (ref 3.87–5.11)
RDW: 12.7 % (ref 11.5–15.5)
WBC: 4.5 10*3/uL (ref 4.0–10.5)

## 2018-07-19 LAB — COMPREHENSIVE METABOLIC PANEL
ALT: 21 U/L (ref 0–35)
AST: 21 U/L (ref 0–37)
Albumin: 4.3 g/dL (ref 3.5–5.2)
Alkaline Phosphatase: 62 U/L (ref 39–117)
BUN: 20 mg/dL (ref 6–23)
CO2: 28 mEq/L (ref 19–32)
Calcium: 9.8 mg/dL (ref 8.4–10.5)
Chloride: 104 mEq/L (ref 96–112)
Creatinine, Ser: 0.9 mg/dL (ref 0.40–1.20)
GFR: 61.63 mL/min (ref >60.00)
Glucose, Bld: 89 mg/dL (ref 70–99)
Potassium: 4.3 mEq/L (ref 3.5–5.1)
Sodium: 141 mEq/L (ref 135–145)
Total Bilirubin: 0.4 mg/dL (ref 0.2–1.2)
Total Protein: 6.7 g/dL (ref 6.0–8.3)

## 2018-07-19 LAB — LIPID PANEL
Cholesterol: 180 mg/dL (ref 0–200)
HDL: 54.4 mg/dL (ref >39.00)
LDL Cholesterol: 102 mg/dL — ABNORMAL HIGH (ref 0–99)
NonHDL: 125.5
Total CHOL/HDL Ratio: 3
Triglycerides: 118 mg/dL (ref 0.0–149.0)
VLDL: 23.6 mg/dL (ref 0.0–40.0)

## 2018-07-19 LAB — TSH: TSH: 2.83 u[IU]/mL (ref 0.35–4.50)

## 2018-07-19 NOTE — Progress Notes (Signed)
Subjective:   Tabitha Carlson is a 73 y.o. female who presents for Medicare Annual (Subsequent) preventive examination.  Review of Systems:  N/A Cardiac Risk Factors include: advanced age (>6men, >25 women);dyslipidemia     Objective:     Vitals: There were no vitals taken for this visit.  There is no height or weight on file to calculate BMI.  Advanced Directives 07/19/2018 06/29/2017 06/18/2016 06/18/2015 12/05/2014  Does Patient Have a Medical Advance Directive? No No No No No  Does patient want to make changes to medical advance directive? - Yes (MAU/Ambulatory/Procedural Areas - Information given) - - -  Would patient like information on creating a medical advance directive? No - Patient declined - - Yes - Scientist, clinical (histocompatibility and immunogenetics) given No - patient declined information    Tobacco Social History   Tobacco Use  Smoking Status Never Smoker  Smokeless Tobacco Never Used     Counseling given: No   Clinical Intake:  Pre-visit preparation completed: Yes  Pain : No/denies pain Pain Score: 0-No pain     Nutritional Status: BMI 25 -29 Overweight Nutritional Risks: None Diabetes: No  How often do you need to have someone help you when you read instructions, pamphlets, or other written materials from your doctor or pharmacy?: 1 - Never What is the last grade level you completed in school?: 12th grade  Interpreter Needed?: No  Comments: pt lives with spouse Information entered by :: LPinson, LPN  Past Medical History:  Diagnosis Date  . Allergic rhinitis   . Anxiety   . Arthritis    in back. Physical Therapy 2016  . Blood donor   . Cancer ([place])    skin on back  . Depression   . DJD (degenerative joint disease)   . Family history of ischemic heart disease   . Gastroparesis   . GERD (gastroesophageal reflux disease)   . Hx of colonic polyps   . Hypercholesteremia   . Hyperlipidemia   . IBS (irritable bowel syndrome)    Past Surgical History:  Procedure  Laterality Date  . ABDOMINAL HYSTERECTOMY  1985   TAH/BSO Age 51 DUB, pelvic pain  . BREAST BIOPSY  1980, 1983   Benign Breast Biospy x 2  . CYST EXCISION  04/2011   Thigh  . left breast biopsy     benign x 2  . SKIN CANCER EXCISION     on back  . SKIN SURGERY     removal of small skin on face   Family History  Problem Relation Age of Onset  . Leukemia Mother   . Hyperlipidemia Sister   . Other Sister        2 cardiac episodes  . Colon cancer Neg Hx    Social History   Socioeconomic History  . Marital status: Married    Spouse name: Tabitha Carlson  . Number of children: 2  . Years of education: Not on file  . Highest education level: Not on file  Occupational History  . Occupation: housewife  Social Needs  . Financial resource strain: Not on file  . Food insecurity:    Worry: Not on file    Inability: Not on file  . Transportation needs:    Medical: Not on file    Non-medical: Not on file  Tobacco Use  . Smoking status: Never Smoker  . Smokeless tobacco: Never Used  Substance and Sexual Activity  . Alcohol use: No    Alcohol/week: 0.0 standard drinks  .  Drug use: No  . Sexual activity: Yes    Partners: Male    Birth control/protection: Surgical    Comment: Hysterectomy  Lifestyle  . Physical activity:    Days per week: Not on file    Minutes per session: Not on file  . Stress: Not on file  Relationships  . Social connections:    Talks on phone: Not on file    Gets together: Not on file    Attends religious service: Not on file    Active member of club or organization: Not on file    Attends meetings of clubs or organizations: Not on file    Relationship status: Not on file  Other Topics Concern  . Not on file  Social History Narrative  . Not on file    Outpatient Encounter Medications as of 07/19/2018  Medication Sig  . acetaminophen (TYLENOL) 325 MG tablet Take 650 mg by mouth every 6 (six) hours as needed.  . ALPRAZolam (XANAX) 0.5 MG tablet TAKE  1 TABLET BY MOUTH THREE TIMES A DAY AS NEEDED  . atorvastatin (LIPITOR) 20 MG tablet Take 1 tablet (20 mg total) by mouth daily.  . calcium gluconate 500 MG tablet Take 500 mg by mouth daily.  Tabitha Carlson Sodium 30-100 MG CAPS Take 2 capsules by mouth daily.  . Cholecalciferol (VITAMIN D) 2000 UNITS CAPS Take 1 capsule by mouth daily.  . Ibuprofen (ADVIL PO) Take by mouth as needed.  . Multiple Vitamins-Minerals (MULTIPLE VITAMINS/WOMENS PO) Take 1 tablet by mouth daily.  Marland Kitchen OVER THE COUNTER MEDICATION Fiber gummies  . pantoprazole (PROTONIX) 40 MG tablet TAKE 1 TABLET BY MOUTH EVERY DAY  . [DISCONTINUED] Fish Oil-Krill Oil CAPS Take 1 capsule by mouth daily.  . [DISCONTINUED] hydrocortisone (ANUSOL-HC) 25 MG suppository Place 1 suppository (25 mg total) rectally at bedtime.   No facility-administered encounter medications on file as of 07/19/2018.     Activities of Daily Living In your present state of health, do you have any difficulty performing the following activities: 07/19/2018  Hearing? N  Vision? N  Difficulty concentrating or making decisions? N  Walking or climbing stairs? N  Dressing or bathing? N  Doing errands, shopping? N  Preparing Food and eating ? N  Using the Toilet? N  In the past six months, have you accidently leaked urine? N  Do you have problems with loss of bowel control? N  Managing your Medications? N  Managing your Finances? N  Housekeeping or managing your Housekeeping? N  Some recent data might be hidden    Patient Care Team: Tower, Wynelle Fanny, MD as PCP - General (Family Medicine) Ralene Bathe, MD as Consulting Physician (Ophthalmology) Regina Eck, CNM as Referring Physician (Certified Nurse Midwife)    Assessment:   This is a routine wellness examination for Tabitha Carlson.  Vision Screening Comments: Vision exam in 2019 with [facility]  Exercise Activities and Dietary recommendations Current Exercise Habits: Home  exercise routine, Type of exercise: walking;treadmill, Time (Minutes): 60, Frequency (Times/Week): 7, Weekly Exercise (Minutes/Week): 420, Intensity: Moderate, Exercise limited by: None identified  Goals    . Increase physical activity     Starting 07/19/2018, I will continue to walk at 60 minutes daily.        Fall Risk Fall Risk  07/19/2018 06/29/2017 06/18/2016 06/18/2015 06/04/2015  Falls in the past year? 0 No Yes Yes Yes  Comment - - pt reports bruising related to fall; fall occurred when patient slipped while getting  out of shower - -  Number falls in past yr: - - 1 1 1   Injury with Fall? - - Yes Yes Yes  Risk Factor Category  - - - - High Fall Risk  Follow up - - Falls prevention discussed Falls evaluation completed -  Comment - - pt verbalized she will always put down a towel on bathroom floor to reduce risk of falls - -   Depression Screen PHQ 2/9 Scores 07/19/2018 06/29/2017 06/18/2016 06/18/2015  PHQ - 2 Score 0 0 0 0  PHQ- 9 Score 0 0 - -     Cognitive Function MMSE - Mini Mental State Exam 07/19/2018 06/29/2017 06/18/2016 06/18/2015  Orientation to time 5 5 5 5   Orientation to Place 5 5 5 5   Registration 3 3 3 3   Attention/ Calculation 0 0 0 0  Recall 3 3 3 3   Language- name 2 objects 0 0 0 0  Language- repeat 1 1 1 1   Language- follow 3 step command 0 3 3 3   Language- read & follow direction 0 0 0 0  Write a sentence 0 0 0 0  Copy design 0 0 0 0  Total score 17 20 20 20      PLEASE NOTE: A Mini-Cog screen was completed. Maximum score is 17. A value of 0 denotes this part of Folstein MMSE was not completed or the patient failed this part of the Mini-Cog screening.   Mini-Cog Screening Orientation to Time - Max 5 pts Orientation to Place - Max 5 pts Registration - Max 3 pts Recall - Max 3 pts Language Repeat - Max 1 pts      Immunization History  Administered Date(s) Administered  . Influenza Split 11/01/2010, 10/23/2011  . Influenza Whole 10/10/2009  . Influenza, High  Dose Seasonal PF 10/23/2014, 11/11/2016, 10/27/2017  . Influenza, Seasonal, Injecte, Preservative Fre 11/14/2015  . Influenza,inj,Quad PF,6+ Mos 10/26/2012, 10/20/2013  . Pneumococcal Conjugate-13 06/04/2015  . Pneumococcal Polysaccharide-23 10/20/2013  . Td 03/04/2000, 03/20/2010  . Zoster 12/11/2014    Screening Tests Health Maintenance  Topic Date Due  . INFLUENZA VACCINE  09/10/2018  . MAMMOGRAM  11/23/2018  . TETANUS/TDAP  03/20/2020  . COLONOSCOPY  12/18/2024  . DEXA SCAN  Completed  . Hepatitis C Screening  Completed  . PNA vac Low Risk Adult  Completed      Plan:     I have personally reviewed, addressed, and noted the following in the patient's chart:  A. Medical and social history B. Use of alcohol, tobacco or illicit drugs  C. Current medications and supplements D. Functional ability and status E.  Nutritional status F.  Physical activity G. Advance directives H. List of other physicians I.  Hospitalizations, surgeries, and ER visits in previous 12 months J.  Vitals (unless it is a telemedicine encounter) K. Screenings to include cognitive, depression, hearing, vision (NOTE: hearing and vision screenings not completed in telemedicine encounter) L. Referrals and appointments   In addition, I have reviewed and discussed with patient certain preventive protocols, quality metrics, and best practice recommendations. A written personalized care plan for preventive services and recommendations were provided to patient.  With patient's permission, we connected on 07/19/18 at  8:30 AM EDT by a video enabled telemedicine application. Two patient identifiers were used to ensure the encounter occurred with the correct person.    Patient was in home and writer was in office.   Signed,   Lindell Noe, MHA, BS, RN Health Coach

## 2018-07-19 NOTE — Patient Instructions (Signed)
Ms. Goodwill , Thank you for taking time to come for your Medicare Wellness Visit. I appreciate your ongoing commitment to your health goals. Please review the following plan we discussed and let me know if I can assist you in the future.   These are the goals we discussed: Goals    . Increase physical activity     Starting 07/19/2018, I will continue to walk at 60 minutes daily.        This is a list of the screening recommended for you and due dates:  Health Maintenance  Topic Date Due  . Flu Shot  09/10/2018  . Mammogram  11/23/2018  . Tetanus Vaccine  03/20/2020  . Colon Cancer Screening  12/18/2024  . DEXA scan (bone density measurement)  Completed  .  Hepatitis C: One time screening is recommended by Center for Disease Control  (CDC) for  adults born from [birth year] through 1964.   Completed  . Pneumonia vaccines  Completed   Preventive Care for Adults  A healthy lifestyle and preventive care can promote health and wellness. Preventive health guidelines for adults include the following key practices.  . A routine yearly physical is a good way to check with your health care provider about your health and preventive screening. It is a chance to share any concerns and updates on your health and to receive a thorough exam.  . Visit your dentist for a routine exam and preventive care every 6 months. Brush your teeth twice a day and floss once a day. Good oral hygiene prevents tooth decay and gum disease.  . The frequency of eye exams is based on your age, health, family medical history, use  of contact lenses, and other factors. Follow your health care provider's recommendations for frequency of eye exams.  . Eat a healthy diet. Foods like vegetables, fruits, whole grains, low-fat dairy products, and lean protein foods contain the nutrients you need without too many calories. Decrease your intake of foods high in solid fats, added sugars, and salt. Eat the right amount of calories for you.  Get information about a proper diet from your health care provider, if necessary.  . Regular physical exercise is one of the most important things you can do for your health. Most adults should get at least 150 minutes of moderate-intensity exercise (any activity that increases your heart rate and causes you to sweat) each week. In addition, most adults need muscle-strengthening exercises on 2 or more days a week.  Silver Sneakers may be a benefit available to you. To determine eligibility, you may visit the website: www.silversneakers.com or contact program at 763-324-1111 Mon-Fri between 8AM-8PM.   . Maintain a healthy weight. The body mass index (BMI) is a screening tool to identify possible weight problems. It provides an estimate of body fat based on height and weight. Your health care provider can find your BMI and can help you achieve or maintain a healthy weight.   For adults 20 years and older: ? A BMI below 18.5 is considered underweight. ? A BMI of 18.5 to 24.9 is normal. ? A BMI of 25 to 29.9 is considered overweight. ? A BMI of 30 and above is considered obese.   . Maintain normal blood lipids and cholesterol levels by exercising and minimizing your intake of saturated fat. Eat a balanced diet with plenty of fruit and vegetables. Blood tests for lipids and cholesterol should begin at age 46 and be repeated every 5 years. If your lipid  or cholesterol levels are high, you are over 50, or you are at high risk for heart disease, you may need your cholesterol levels checked more frequently. Ongoing high lipid and cholesterol levels should be treated with medicines if diet and exercise are not working.  . If you smoke, find out from your health care provider how to quit. If you do not use tobacco, please do not start.  . If you choose to drink alcohol, please do not consume more than 2 drinks per day. One drink is considered to be 12 ounces (355 mL) of beer, 5 ounces (148 mL) of wine, or  1.5 ounces (44 mL) of liquor.  . If you are 69-60 years old, ask your health care provider if you should take aspirin to prevent strokes.  . Use sunscreen. Apply sunscreen liberally and repeatedly throughout the day. You should seek shade when your shadow is shorter than you. Protect yourself by wearing long sleeves, pants, a wide-brimmed hat, and sunglasses year round, whenever you are outdoors.  . Once a month, do a whole body skin exam, using a mirror to look at the skin on your back. Tell your health care provider of new moles, moles that have irregular borders, moles that are larger than a pencil eraser, or moles that have changed in shape or color.

## 2018-07-19 NOTE — Progress Notes (Signed)
PCP notes:   Health maintenance:  No gaps identified.   Abnormal screenings:   None  Patient concerns:   None  Nurse concerns:  None  Next PCP appt:   07/22/18 @ 1045  I reviewed health advisor's note, was available for consultation, and agree with documentation and plan. Loura Pardon MD

## 2018-07-22 ENCOUNTER — Encounter: Payer: Self-pay | Admitting: Family Medicine

## 2018-07-22 ENCOUNTER — Ambulatory Visit (INDEPENDENT_AMBULATORY_CARE_PROVIDER_SITE_OTHER): Payer: Medicare HMO | Admitting: Family Medicine

## 2018-07-22 VITALS — BP 118/80 | Ht 65.5 in | Wt 143.0 lb

## 2018-07-22 DIAGNOSIS — K219 Gastro-esophageal reflux disease without esophagitis: Secondary | ICD-10-CM | POA: Diagnosis not present

## 2018-07-22 DIAGNOSIS — E7800 Pure hypercholesterolemia, unspecified: Secondary | ICD-10-CM

## 2018-07-22 DIAGNOSIS — M85859 Other specified disorders of bone density and structure, unspecified thigh: Secondary | ICD-10-CM | POA: Diagnosis not present

## 2018-07-22 MED ORDER — ATORVASTATIN CALCIUM 20 MG PO TABS: 20.0000 mg | ORAL_TABLET | Freq: Every day | ORAL | 3 refills | Status: DC

## 2018-07-22 NOTE — Assessment & Plan Note (Signed)
Does well with protonix as long as she does not eat the wrong things  Wants to continue this  Commended wt loss

## 2018-07-22 NOTE — Assessment & Plan Note (Signed)
Disc goals for lipids and reasons to control them Rev last labs with pt Rev low sat fat diet in detail LDL is up but she has eaten more red meat cooking at home  Will watch this more closely  Refilled atorvastatin-continues to take this

## 2018-07-22 NOTE — Patient Instructions (Signed)
Keep walking safely for exercise  Keep up a good fluid intake  Healthy diet/less red meat for cholesterol  Good job with weight loss Stay safe and take care of yourself  Mammogram is due in the fall

## 2018-07-22 NOTE — Progress Notes (Signed)
Virtual Visit via Video Note  I connected with Tabitha Carlson on 07/22/18 at 10:45 AM EDT by a video enabled telemedicine application and verified that I am speaking with the correct person using two identifiers.  Location: Patient: home Provider: office   I discussed the limitations of evaluation and management by telemedicine and the availability of in person appointments. The patient expressed understanding and agreed to proceed.  History of Present Illness: Pt presents for annual f/u of chronic medical problems   Getting out daily for a walk - with her sister  Early in the am   Weight  Wt Readings from Last 3 Encounters:  07/06/17 150 lb 8 oz (68.3 kg)  06/29/17 149 lb 8 oz (67.8 kg)  03/11/17 153 lb (69.4 kg)  143 lb at home   No BP problems bp 113/80   No change in height   amw 6/9 No problems/gaps in care   Mammogram 10/19 - nl  Self breast exam - no lumps or changes   Colonoscopy 11/16   dexa 10/19 -osteopenia /improved [place]-none  Fractures-none  Supplements - vit D and ca   GERD-doing well most days  Ate melon yesterday- upset her stomach a bit Better today protonix -works well   Lab Results  Component Value Date   CREATININE 0.90 07/19/2018   BUN 20 07/19/2018   NA 141 07/19/2018   K 4.3 07/19/2018   CL 104 07/19/2018   CO2 28 07/19/2018   Lab Results  Component Value Date   ALT 21 07/19/2018   AST 21 07/19/2018   ALKPHOS 62 07/19/2018   BILITOT 0.4 07/19/2018   Lab Results  Component Value Date   WBC 4.5 07/19/2018   HGB 13.8 07/19/2018   HCT 41.7 07/19/2018   MCV 91.4 07/19/2018   PLT 192.0 07/19/2018   Lab Results  Component Value Date   TSH 2.83 07/19/2018      Hyperlipidemia Lab Results  Component Value Date   CHOL 180 07/19/2018   CHOL 153 06/29/2017   CHOL 172 06/18/2016   Lab Results  Component Value Date   HDL 54.40 07/19/2018   HDL 58.40 06/29/2017   HDL 64.90 06/18/2016   Lab Results  Component Value Date    LDLCALC 102 (H) 07/19/2018   LDLCALC 77 06/29/2017   LDLCALC 86 06/18/2016   Lab Results  Component Value Date   TRIG 118.0 07/19/2018   TRIG 86.0 06/29/2017   TRIG 104.0 06/18/2016   Lab Results  Component Value Date   CHOLHDL 3 07/19/2018   CHOLHDL 3 06/29/2017   CHOLHDL 3 06/18/2016   No results found for: LDLDIRECT Atorvastatin and diet - no missed doses  Occasional hamburger/beef perhaps once per week or less    Anxiety Xanax prn  Doing well lately with mood   Patient Active Problem List   Diagnosis Date Noted  . Medicare annual wellness visit, subsequent 06/29/2017  . Estrogen deficiency 06/04/2015  . Routine general medical examination at a health care facility 05/18/2015  . Encounter for Medicare annual wellness exam 05/18/2014  . Osteopenia 10/20/2013  . Gastroparesis 01/02/2008  . ALLERGIC RHINITIS 09/12/2007  . COLONIC POLYPS 03/16/2007  . GERD 03/16/2007  . Hyperlipidemia 02/21/2007  . Anxiety state 02/21/2007  . IBS 02/21/2007  . DEGENERATIVE JOINT DISEASE 02/21/2007   Past Medical History:  Diagnosis Date  . Allergic rhinitis   . Anxiety   . Arthritis    in back. Physical Therapy 2016  . Blood donor   .  Cancer ([place])    skin on back  . Depression   . DJD (degenerative joint disease)   . Family history of ischemic heart disease   . Gastroparesis   . GERD (gastroesophageal reflux disease)   . Hx of colonic polyps   . Hypercholesteremia   . Hyperlipidemia   . IBS (irritable bowel syndrome)    Past Surgical History:  Procedure Laterality Date  . ABDOMINAL HYSTERECTOMY  1985   TAH/BSO Age 65 DUB, pelvic pain  . BREAST BIOPSY  1980, 1983   Benign Breast Biospy x 2  . CYST EXCISION  04/2011   Thigh  . left breast biopsy     benign x 2  . SKIN CANCER EXCISION     on back  . SKIN SURGERY     removal of small skin on face   Social History   Tobacco Use  . Smoking status: Never Smoker  . Smokeless tobacco: Never Used  Substance Use  Topics  . Alcohol use: No    Alcohol/week: 0.0 standard drinks  . Drug use: No   Family History  Problem Relation Age of Onset  . Leukemia Mother   . Hyperlipidemia Sister   . Other Sister        2 cardiac episodes  . Colon cancer Neg Hx    Allergies  Allergen Reactions  . Aspirin     Unable to take a full aspirin--causes palpitations Tachycardia  . Crab [Shellfish Allergy]     Rash from head to toe   Current Outpatient Medications on File Prior to Visit  Medication Sig Dispense Refill  . acetaminophen (TYLENOL) 325 MG tablet Take 650 mg by mouth every 6 (six) hours as needed.    . ALPRAZolam (XANAX) 0.5 MG tablet TAKE 1 TABLET BY MOUTH THREE TIMES A DAY AS NEEDED 90 tablet 3  . calcium gluconate 500 MG tablet Take 500 mg by mouth daily.    [name] Sodium 30-100 MG CAPS Take 2 capsules by mouth daily.    . Cholecalciferol (VITAMIN D) 2000 UNITS CAPS Take 1 capsule by mouth daily.    . Ibuprofen (ADVIL PO) Take by mouth as needed.    . Multiple Vitamins-Minerals (MULTIPLE VITAMINS/WOMENS PO) Take 1 tablet by mouth daily.    Marland Kitchen OVER THE COUNTER MEDICATION Fiber gummies    . pantoprazole (PROTONIX) 40 MG tablet TAKE 1 TABLET BY MOUTH EVERY DAY 90 tablet 0   No current facility-administered medications on file prior to visit.     Review of Systems  Constitutional: Positive for weight loss. Negative for chills, diaphoresis, fever and malaise/fatigue.  HENT: Negative for sore throat.   Eyes: Negative for blurred vision.  Respiratory: Negative for cough and shortness of breath.   Cardiovascular: Negative for chest pain, palpitations and leg swelling.  Gastrointestinal: Negative for blood in stool, heartburn and nausea.  Musculoskeletal: Negative for myalgias.  Skin: Negative for itching and rash.  Neurological: Negative for dizziness and headaches.  Psychiatric/Behavioral: Negative for depression and memory loss.    Observations/Objective: Patient appears well,  in no distress Weight is baseline  No facial swelling or asymmetry Normal voice-not hoarse and no slurred speech No obvious tremor or mobility impairment Moving neck and UEs normally Able to hear the call well  No cough or shortness of breath during interview  Talkative and mentally sharp with no cognitive changes No skin changes on face or neck , no rash or pallor Affect is normal    Assessment  and Plan: Problem List Items Addressed This Visit      Digestive   GERD    Does well with protonix as long as she does not eat the wrong things  Wants to continue this  Commended wt loss         Musculoskeletal and Integument   Osteopenia    Last dexa was 10/19  Some mild improvement  No falls or fx On ca and D -also walks for exercise  Disc need for calcium/ vitamin D/ wt bearing exercise and bone density test every 2 y to monitor Disc safety/ fracture risk in detail          Other   Hyperlipidemia - Primary    Disc goals for lipids and reasons to control them Rev last labs with pt Rev low sat fat diet in detail LDL is up but she has eaten more red meat cooking at home  Will watch this more closely  Refilled atorvastatin-continues to take this       Relevant Medications   atorvastatin (LIPITOR) 20 MG tablet       Follow Up Instructions: Keep walking safely for exercise  Keep up a good fluid intake  Healthy diet/less red meat for cholesterol  Good job with weight loss Stay safe and take care of yourself  Mammogram is due in the fall    I discussed the assessment and treatment plan with the patient. The patient was provided an opportunity to ask questions and all were answered. The patient agreed with the plan and demonstrated an understanding of the instructions.   The patient was advised to call back or seek an in-person evaluation if the symptoms worsen or if the condition fails to improve as anticipated.   Loura Pardon, MD

## 2018-07-22 NOTE — Assessment & Plan Note (Signed)
Last dexa was 10/19  Some mild improvement  No falls or fx On ca and D -also walks for exercise  Disc need for calcium/ vitamin D/ wt bearing exercise and bone density test every 2 y to monitor Disc safety/ fracture risk in detail

## 2018-09-08 ENCOUNTER — Other Ambulatory Visit: Payer: Self-pay | Admitting: Family Medicine

## 2018-10-04 DIAGNOSIS — R69 Illness, unspecified: Secondary | ICD-10-CM | POA: Diagnosis not present

## 2018-10-10 ENCOUNTER — Other Ambulatory Visit: Payer: Self-pay | Admitting: Family Medicine

## 2018-10-10 DIAGNOSIS — Z1231 Encounter for screening mammogram for malignant neoplasm of breast: Secondary | ICD-10-CM

## 2018-10-13 ENCOUNTER — Ambulatory Visit (INDEPENDENT_AMBULATORY_CARE_PROVIDER_SITE_OTHER): Payer: Medicare HMO

## 2018-10-13 DIAGNOSIS — Z23 Encounter for immunization: Secondary | ICD-10-CM

## 2018-10-20 DIAGNOSIS — Z20828 Contact with and (suspected) exposure to other viral communicable diseases: Secondary | ICD-10-CM | POA: Diagnosis not present

## 2018-11-25 ENCOUNTER — Other Ambulatory Visit: Payer: Self-pay

## 2018-11-25 ENCOUNTER — Ambulatory Visit
Admission: RE | Admit: 2018-11-25 | Discharge: 2018-11-25 | Disposition: A | Discharge status: Home / Self Care | Payer: Medicare HMO | Source: Ambulatory Visit | Attending: Family Medicine | Admitting: Family Medicine

## 2018-11-25 DIAGNOSIS — Z1231 Encounter for screening mammogram for malignant neoplasm of breast: Secondary | ICD-10-CM | POA: Diagnosis not present

## 2018-11-28 DIAGNOSIS — H2513 Age-related nuclear cataract, bilateral: Secondary | ICD-10-CM | POA: Diagnosis not present

## 2018-11-28 DIAGNOSIS — H5213 Myopia, bilateral: Secondary | ICD-10-CM | POA: Diagnosis not present

## 2018-11-28 DIAGNOSIS — Z0100 Encounter for examination of eyes and vision without abnormal findings: Secondary | ICD-10-CM | POA: Diagnosis not present

## 2018-12-13 ENCOUNTER — Other Ambulatory Visit: Payer: Self-pay | Admitting: Family Medicine

## 2018-12-13 NOTE — Telephone Encounter (Signed)
Name of Medication:Xanax Name of Pharmacy:[facility] or Written Date and Quantity:05/17/18 #90 tabs with 3 refills Last Office Visit and Type:CPE on 07/22/18 Next Office Visit and Type:none scheduled  Last Controlled Substance Agreement Date:never done Last [id number] done

## 2019-02-21 ENCOUNTER — Other Ambulatory Visit: Payer: Self-pay | Admitting: Family Medicine

## 2019-02-22 NOTE — Telephone Encounter (Signed)
Sorry it was my error, last filled on 12/13/18 #90 tabs with NO REFILLS, please advise

## 2019-02-22 NOTE — Telephone Encounter (Signed)
Name of Medication:Xanax Name of Pharmacy:[facility] or Written Date and Quantity:12/13/18 #90 tabs with 3 refills Last Office Visit and Type:CPE on 07/22/18 Next Office Visit and Type:CPE on 07/25/19  Last Controlled Substance Agreement Date:never done Last [id number] done

## 2019-02-22 NOTE — Telephone Encounter (Signed)
Too early

## 2019-03-22 ENCOUNTER — Encounter: Payer: Self-pay | Admitting: Family Medicine

## 2019-03-22 ENCOUNTER — Other Ambulatory Visit: Payer: Self-pay

## 2019-03-22 ENCOUNTER — Ambulatory Visit (INDEPENDENT_AMBULATORY_CARE_PROVIDER_SITE_OTHER): Payer: Medicare HMO | Admitting: Family Medicine

## 2019-03-22 VITALS — BP 140/80 | HR 110 | Temp 97.1°F | Ht 65.5 in | Wt 146.4 lb

## 2019-03-22 DIAGNOSIS — R21 Rash and other nonspecific skin eruption: Secondary | ICD-10-CM | POA: Diagnosis not present

## 2019-03-22 DIAGNOSIS — R030 Elevated blood-pressure reading, without diagnosis of hypertension: Secondary | ICD-10-CM | POA: Diagnosis not present

## 2019-03-22 NOTE — Assessment & Plan Note (Signed)
Macular rash limited to anterior surface of forearms- tan to slt pink in color  Does not blanche No itching or discomfort or systemic symptoms  Reviewed products/medications extensively  Ref to dermatology for eval and tx  Disc avoidance of hot water/chemical exposure and scents when able  inst to call if symptoms change or worsen in the meantime

## 2019-03-22 NOTE — Patient Instructions (Addendum)
I placed a dermatology referral -our office will call you   If the rash worsens or causes pain or itching please let me know   Please check blood pressure and pulse when you are relaxed - let me know if not back to normal   Continue to use mild and unscented products

## 2019-03-22 NOTE — Progress Notes (Signed)
Subjective:    Patient ID: Tabitha Carlson, female    DOB: 1945/06/24, 74 y.o.   MRN: [id number]  This visit occurred during the SARS-CoV-2 public health emergency.  Safety protocols were in place, including screening questions prior to the visit, additional usage of staff PPE, and extensive cleaning of exam room while observing appropriate contact time as indicated for disinfecting solutions.    HPI Pt presents for rash on both arms   Wt Readings from Last 3 Encounters:  03/22/19 146 lb 6 oz (66.4 kg)  07/22/18 143 lb (64.9 kg)  07/06/17 150 lb 8 oz (68.3 kg)   23.99 kg/m    Rash on both arms for 6-8 weeks  Waxes and wanes but does not go away  More red if she gets warm   It does not itch  No vesicles or pustules No where else-just arms   No new medicines No new foods  No new supplements   Products:  She did buy some new antibacterial soap at onset- she stopped using it  Usually uses dove unscented  Moisturizers - [place] magic (used for 15 y) - lightly scented  vaseline on hands and arms  Perfume- stopped   occ in sun-walks about 4 pm / arms are covered up   No wheezing or sob No throat symptoms   She has never experienced hives from heat   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 Rash and nonspecific skin eruption 03/22/2019  . Elevated blood pressure reading 03/22/2019  . Medicare annual wellness visit, subsequent 06/29/2017  . Estrogen deficiency 06/04/2015  . Routine general medical examination at a health care  facility 05/18/2015  . Encounter for Medicare annual wellness exam 05/18/2014  . Osteopenia 10/20/2013  . Gastroparesis 01/02/2008  . ALLERGIC RHINITIS 09/12/2007  . COLONIC POLYPS 03/16/2007  . GERD 03/16/2007  . Hyperlipidemia 02/21/2007  . Anxiety state 02/21/2007  . IBS 02/21/2007  . DEGENERATIVE JOINT DISEASE 02/21/2007   Past Medical History:  Diagnosis Date  . Allergic rhinitis   . Anxiety   . Arthritis    in back. Physical Therapy 2016  . Blood donor   . Cancer ([place])    skin on back  . Depression   . DJD (degenerative joint disease)   . Family history of ischemic heart disease   . Gastroparesis   . GERD (gastroesophageal reflux disease)   . Hx of colonic polyps   . Hypercholesteremia   . Hyperlipidemia   . IBS (irritable bowel syndrome)    Past Surgical History:  Procedure Laterality Date  . ABDOMINAL HYSTERECTOMY  1985   TAH/BSO Age 50 DUB, pelvic pain  . BREAST EXCISIONAL BIOPSY Left 1980, 1983   Benign Breast Biospy x 2  . CYST EXCISION  04/2011   Thigh  . left breast biopsy     benign x 2  . SKIN CANCER EXCISION     on back  . SKIN  SURGERY     removal of small skin on face   Social History   Tobacco Use  . Smoking status: Never Smoker  . Smokeless tobacco: Never Used  Substance Use Topics  . Alcohol use: No    Alcohol/week: 0.0 standard drinks  . Drug use: No   Family History  Problem Relation Age of Onset  . Leukemia Mother   . Hyperlipidemia Sister   . Other Sister        2 cardiac episodes  . Breast cancer Maternal Aunt   . Breast cancer Maternal Aunt   . Colon cancer Neg Hx    Allergies  Allergen Reactions  . Aspirin     Unable to take a full aspirin--causes palpitations Tachycardia  . Crab [Shellfish Allergy]     Rash from head to toe   Current Outpatient Medications on File Prior to Visit  Medication Sig Dispense Refill  . acetaminophen (TYLENOL) 325 MG tablet Take 650 mg by mouth every 6 (six) hours as needed.    .  ALPRAZolam (XANAX) 0.5 MG tablet TAKE 1 TABLET BY MOUTH THREE TIMES A DAY AS NEEDED 90 tablet 0  . atorvastatin (LIPITOR) 20 MG tablet Take 1 tablet (20 mg total) by mouth daily. 90 tablet 3  . calcium gluconate 500 MG tablet Take 500 mg by mouth daily.    [name] Sodium 30-100 MG CAPS Take 2 capsules by mouth daily.    . Cholecalciferol (VITAMIN D) 2000 UNITS CAPS Take 1 capsule by mouth daily.    . Ibuprofen (ADVIL PO) Take by mouth as needed.    . Multiple Vitamins-Minerals (MULTIPLE VITAMINS/WOMENS PO) Take 1 tablet by mouth daily.    Marland Kitchen OVER THE COUNTER MEDICATION Fiber gummies    . pantoprazole (PROTONIX) 40 MG tablet TAKE 1 TABLET BY MOUTH EVERY DAY 90 tablet 1   No current facility-administered medications on file prior to visit.    Review of Systems  Constitutional: Negative for activity change, appetite change, fatigue, fever and unexpected weight change.  HENT: Negative for congestion, ear pain, rhinorrhea, sinus pressure and sore throat.   Eyes: Negative for pain, redness and visual disturbance.  Respiratory: Negative for cough, shortness of breath and wheezing.   Cardiovascular: Negative for chest pain and palpitations.  Gastrointestinal: Negative for abdominal pain, blood in stool, constipation and diarrhea.  Endocrine: Negative for polydipsia and polyuria.  Genitourinary: Negative for dysuria, frequency and urgency.  Musculoskeletal: Negative for arthralgias, back pain and myalgias.  Skin: Positive for rash. Negative for pallor.  Allergic/Immunologic: Negative for environmental allergies.  Neurological: Negative for dizziness, syncope and headaches.  Hematological: Negative for adenopathy. Does not bruise/bleed easily.  Psychiatric/Behavioral: Negative for decreased concentration and dysphoric mood. The patient is nervous/anxious.        Objective:   Physical Exam Constitutional:      General: She is not in acute distress.    Appearance: Normal  appearance. She is normal weight. She is not ill-appearing.  HENT:     Head: Normocephalic and atraumatic.     Mouth/Throat:     Mouth: Mucous membranes are moist.  Eyes:     General:        Right eye: No discharge.        Left eye: No discharge.     Extraocular Movements: Extraocular movements intact.     Conjunctiva/sclera: Conjunctivae normal.     Pupils: Pupils are equal, round, and reactive to light.  Cardiovascular:     Rate  and Rhythm: Regular rhythm. Tachycardia present.  Pulmonary:     Effort: Pulmonary effort is normal. No respiratory distress.     Breath sounds: Normal breath sounds. No wheezing or rales.  Musculoskeletal:     Cervical back: Normal range of motion and neck supple.  Lymphadenopathy:     Cervical: No cervical adenopathy.  Skin:    General: Skin is warm and dry.     Coloration: Skin is not jaundiced.     Findings: Rash present. No bruising.     Comments: Macular rash -patchy in pattern on anterior forearms only  Not raised and no scale No lesions  Non tender and no excoriations Does not blanche  Tan to slt pink in color  No other rashes on body   Neurological:     Mental Status: She is alert. Mental status is at baseline.  Psychiatric:        Mood and Affect: Mood is anxious.        Cognition and Memory: Cognition and memory normal.     Comments: Slightly anxious today  Pleasant and talkative           Assessment & Plan:   Problem List Items Addressed This Visit      Musculoskeletal and Integument   Rash and nonspecific skin eruption - Primary    Macular rash limited to anterior surface of forearms- tan to slt pink in color  Does not blanche No itching or discomfort or systemic symptoms  Reviewed products/medications extensively  Ref to dermatology for eval and tx  Disc avoidance of hot water/chemical exposure and scents when able  inst to call if symptoms change or worsen in the meantime      Relevant Orders   Ambulatory referral  to Dermatology     Other   Elevated blood pressure reading    Today pt states she is quite anxious (stressors at home)  Pulse and bp both elevated  BP: 140/80  - improving on 2nd check  inst pt to check at home and alert [place] if this does not return to nl (parameters given)

## 2019-03-22 NOTE — Assessment & Plan Note (Signed)
Today pt states she is quite anxious (stressors at home)  Pulse and bp both elevated  BP: 140/80  - improving on 2nd check  inst pt to check at home and alert [place] if this does not return to nl (parameters given)

## 2019-03-24 ENCOUNTER — Encounter: Payer: Self-pay | Admitting: Family Medicine

## 2019-03-24 DIAGNOSIS — L931 Subacute cutaneous lupus erythematosus: Secondary | ICD-10-CM | POA: Diagnosis not present

## 2019-03-24 DIAGNOSIS — Z85828 Personal history of other malignant neoplasm of skin: Secondary | ICD-10-CM | POA: Diagnosis not present

## 2019-03-24 DIAGNOSIS — R21 Rash and other nonspecific skin eruption: Secondary | ICD-10-CM | POA: Diagnosis not present

## 2019-03-24 DIAGNOSIS — L308 Other specified dermatitis: Secondary | ICD-10-CM | POA: Diagnosis not present

## 2019-03-26 ENCOUNTER — Ambulatory Visit: Payer: Medicare HMO | Attending: Internal Medicine

## 2019-03-26 DIAGNOSIS — Z23 Encounter for immunization: Secondary | ICD-10-CM | POA: Insufficient documentation

## 2019-03-26 NOTE — Progress Notes (Signed)
   Covid-19 Vaccination Clinic  Name:  ANASTASI KEGG    MRN: [id number] DOB: 05/08/1945  03/26/2019  Ms. Nylin was observed post Covid-19 immunization for 15 minutes without incidence. She was provided with Vaccine Information Sheet and instruction to access the V-Safe system.   Ms. Densley was instructed to call 911 with any severe reactions post vaccine: Marland Kitchen Difficulty breathing  . Swelling of your face and throat  . A fast heartbeat  . A bad rash all over your body  . Dizziness and weakness    Immunizations Administered    Name Date Dose VIS Date Route   Pfizer COVID-19 Vaccine 03/26/2019  9:37 AM 0.3 mL 01/20/2019 Intramuscular   Manufacturer: [place]   Lot: [id number]   [place]: [id number]

## 2019-04-03 DIAGNOSIS — Z008 Encounter for other general examination: Secondary | ICD-10-CM | POA: Diagnosis not present

## 2019-04-03 DIAGNOSIS — L930 Discoid lupus erythematosus: Secondary | ICD-10-CM | POA: Diagnosis not present

## 2019-04-17 DIAGNOSIS — R69 Illness, unspecified: Secondary | ICD-10-CM | POA: Diagnosis not present

## 2019-04-18 ENCOUNTER — Ambulatory Visit: Payer: Medicare HMO | Attending: Internal Medicine

## 2019-04-18 DIAGNOSIS — Z23 Encounter for immunization: Secondary | ICD-10-CM | POA: Insufficient documentation

## 2019-04-18 NOTE — Progress Notes (Signed)
   Covid-19 Vaccination Clinic  Name:  ATHALEEN GALLEN    MRN: [id number] DOB: 1945/02/22  04/18/2019  Ms. Hospodar was observed post Covid-19 immunization for 15 minutes without incident. She was provided with Vaccine Information Sheet and instruction to access the V-Safe system.   Ms. Sybert was instructed to call 911 with any severe reactions post vaccine: Marland Kitchen Difficulty breathing  . Swelling of face and throat  . A fast heartbeat  . A bad rash all over body  . Dizziness and weakness   Immunizations Administered    Name Date Dose VIS Date Route   Pfizer COVID-19 Vaccine 04/18/2019  9:31 AM 0.3 mL 01/20/2019 Intramuscular   Manufacturer: [place]   Lot: [id number]   [place]: [id number]

## 2019-04-21 ENCOUNTER — Other Ambulatory Visit: Payer: Self-pay | Admitting: Family Medicine

## 2019-04-21 NOTE — Telephone Encounter (Signed)
Name of Medication:Xanax Name of Pharmacy:[facility] or Written Date and Quantity:02/22/19#90 tabs with 0 refills Last Office Visit and Type:Rash on 03/22/19 Next Office Visit and Type:CPE on 07/25/19  PCP out of the office so, will route to PCP and a provider in office

## 2019-05-02 ENCOUNTER — Encounter: Payer: Self-pay | Admitting: Certified Nurse Midwife

## 2019-06-08 ENCOUNTER — Other Ambulatory Visit: Payer: Self-pay | Admitting: Family Medicine

## 2019-06-13 ENCOUNTER — Other Ambulatory Visit: Payer: Self-pay | Admitting: Family Medicine

## 2019-06-13 NOTE — Telephone Encounter (Signed)
Name of Medication:Xanax Name of Pharmacy:[facility] or Written Date and Quantity:04/21/19#90 tabs with 0 refills Last Office Visit and Type:Rash on 03/22/19 Next Office Visit and Type:CPE on 07/25/19

## 2019-07-05 DIAGNOSIS — D2262 Melanocytic nevi of left upper limb, including shoulder: Secondary | ICD-10-CM | POA: Diagnosis not present

## 2019-07-05 DIAGNOSIS — Z85828 Personal history of other malignant neoplasm of skin: Secondary | ICD-10-CM | POA: Diagnosis not present

## 2019-07-05 DIAGNOSIS — D1801 Hemangioma of skin and subcutaneous tissue: Secondary | ICD-10-CM | POA: Diagnosis not present

## 2019-07-05 DIAGNOSIS — D2261 Melanocytic nevi of right upper limb, including shoulder: Secondary | ICD-10-CM | POA: Diagnosis not present

## 2019-07-05 DIAGNOSIS — L814 Other melanin hyperpigmentation: Secondary | ICD-10-CM | POA: Diagnosis not present

## 2019-07-05 DIAGNOSIS — D225 Melanocytic nevi of trunk: Secondary | ICD-10-CM | POA: Diagnosis not present

## 2019-07-05 DIAGNOSIS — L821 Other seborrheic keratosis: Secondary | ICD-10-CM | POA: Diagnosis not present

## 2019-07-19 ENCOUNTER — Telehealth: Payer: Self-pay | Admitting: Family Medicine

## 2019-07-19 DIAGNOSIS — E7800 Pure hypercholesterolemia, unspecified: Secondary | ICD-10-CM

## 2019-07-19 DIAGNOSIS — Z0000 Encounter for general adult medical examination without abnormal findings: Secondary | ICD-10-CM

## 2019-07-19 NOTE — Telephone Encounter (Signed)
-----   Message from Ellamae Sia sent at 07/05/2019 12:15 PM EDT ----- Regarding: Lab orders for Thursday, 6.10.21 Patient is scheduled for CPX labs, please order future labs, Thanks , Karna Christmas

## 2019-07-20 ENCOUNTER — Ambulatory Visit (INDEPENDENT_AMBULATORY_CARE_PROVIDER_SITE_OTHER): Payer: Medicare HMO

## 2019-07-20 ENCOUNTER — Other Ambulatory Visit (INDEPENDENT_AMBULATORY_CARE_PROVIDER_SITE_OTHER): Payer: Medicare HMO

## 2019-07-20 DIAGNOSIS — E7800 Pure hypercholesterolemia, unspecified: Secondary | ICD-10-CM

## 2019-07-20 DIAGNOSIS — Z0000 Encounter for general adult medical examination without abnormal findings: Secondary | ICD-10-CM

## 2019-07-20 LAB — CBC WITH DIFFERENTIAL/PLATELET
Basophils Absolute: 0 10*3/uL (ref 0.0–0.1)
Basophils Relative: 0.5 % (ref 0.0–3.0)
Eosinophils Absolute: 0.2 10*3/uL (ref 0.0–0.7)
Eosinophils Relative: 4.8 % (ref 0.0–5.0)
HCT: 41.5 % (ref 36.0–46.0)
Hemoglobin: 13.9 g/dL (ref 12.0–15.0)
Lymphocytes Relative: 40.2 % (ref 12.0–46.0)
Lymphs Abs: 1.9 10*3/uL (ref 0.7–4.0)
MCHC: 33.5 g/dL (ref 30.0–36.0)
MCV: 91.5 fl (ref 78.0–100.0)
Monocytes Absolute: 0.4 10*3/uL (ref 0.1–1.0)
Monocytes Relative: 8.7 % (ref 3.0–12.0)
Neutro Abs: 2.2 10*3/uL (ref 1.4–7.7)
Neutrophils Relative %: 45.8 % (ref 43.0–77.0)
Platelets: 197 10*3/uL (ref 150.0–400.0)
RBC: 4.53 Mil/uL (ref 3.87–5.11)
RDW: 12.7 % (ref 11.5–15.5)
WBC: 4.8 10*3/uL (ref 4.0–10.5)

## 2019-07-20 LAB — LIPID PANEL
Cholesterol: 169 mg/dL (ref 0–200)
HDL: 50.9 mg/dL (ref >39.00)
LDL Cholesterol: 93 mg/dL (ref 0–99)
NonHDL: 118.52
Total CHOL/HDL Ratio: 3
Triglycerides: 126 mg/dL (ref 0.0–149.0)
VLDL: 25.2 mg/dL (ref 0.0–40.0)

## 2019-07-20 LAB — COMPREHENSIVE METABOLIC PANEL
ALT: 20 U/L (ref 0–35)
AST: 21 U/L (ref 0–37)
Albumin: 4.5 g/dL (ref 3.5–5.2)
Alkaline Phosphatase: 72 U/L (ref 39–117)
BUN: 21 mg/dL (ref 6–23)
CO2: 31 mEq/L (ref 19–32)
Calcium: 9.9 mg/dL (ref 8.4–10.5)
Chloride: 102 mEq/L (ref 96–112)
Creatinine, Ser: 0.95 mg/dL (ref 0.40–1.20)
GFR: 57.74 mL/min — ABNORMAL LOW (ref >60.00)
Glucose, Bld: 97 mg/dL (ref 70–99)
Potassium: 4.2 mEq/L (ref 3.5–5.1)
Sodium: 139 mEq/L (ref 135–145)
Total Bilirubin: 0.5 mg/dL (ref 0.2–1.2)
Total Protein: 7 g/dL (ref 6.0–8.3)

## 2019-07-20 LAB — TSH: TSH: 3.37 u[IU]/mL (ref 0.35–4.50)

## 2019-07-20 NOTE — Progress Notes (Addendum)
Subjective:   Tabitha Carlson is a 74 y.o. female who presents for Medicare Annual (Subsequent) preventive examination.  Review of Systems: N/A   I connected with the patient today by telephone and verified that I am speaking with the correct person using two identifiers. Location patient: home Location nurse: work Persons participating in the virtual visit: patient, Marine scientist.   I discussed the limitations, risks, security and privacy concerns of performing an evaluation and management service by telephone and the availability of in person appointments. I also discussed with the patient that there may be a patient responsible charge related to this service. The patient expressed understanding and verbally consented to this telephonic visit.    Interactive audio and video telecommunications were attempted between this nurse and patient, however failed, due to patient having technical difficulties OR patient did not have access to video capability.  We continued and completed visit with audio only.     Cardiac Risk Factors include: advanced age (>32men, >17 women);dyslipidemia     Objective:     Vitals: There were no vitals taken for this visit.  There is no height or weight on file to calculate BMI.  Advanced Directives 07/20/2019 07/19/2018 06/29/2017 06/18/2016 06/18/2015 12/05/2014  Does Patient Have a Medical Advance Directive? No No No No No No  Does patient want to make changes to medical advance directive? - - Yes (MAU/Ambulatory/Procedural Areas - Information given) - - -  Would patient like information on creating a medical advance directive? No - Patient declined No - Patient declined - - Yes - Scientist, clinical (histocompatibility and immunogenetics) given No - patient declined information    Tobacco Social History   Tobacco Use  Smoking Status Never Smoker  Smokeless Tobacco Never Used     Counseling given: Not Answered   Clinical Intake:  Pre-visit preparation completed: Yes  Pain : No/denies pain      Nutritional Risks: None Diabetes: No  How often do you need to have someone help you when you read instructions, pamphlets, or other written materials from your doctor or pharmacy?: 1 - Never What is the last grade level you completed in school?: 12th  Interpreter Needed?: No  Information entered by :: CJohnson, LPN  Past Medical History:  Diagnosis Date   Allergic rhinitis    Anxiety    Arthritis    in back. Physical Therapy 2016   Blood donor    Cancer ([place])    skin on back   Depression    DJD (degenerative joint disease)    Family history of ischemic heart disease    Gastroparesis    GERD (gastroesophageal reflux disease)    Hx of colonic polyps    Hypercholesteremia    Hyperlipidemia    IBS (irritable bowel syndrome)    Past Surgical History:  Procedure Laterality Date   ABDOMINAL HYSTERECTOMY  1985   TAH/BSO Age 51 DUB, pelvic pain   BREAST EXCISIONAL BIOPSY Left 1980, 1983   Benign Breast Biospy x 2   CYST EXCISION  04/2011   Thigh   left breast biopsy     benign x 2   SKIN CANCER EXCISION     on back   SKIN SURGERY     removal of small skin on face   Family History  Problem Relation Age of Onset   Leukemia Mother    Hyperlipidemia Sister    Other Sister        2 cardiac episodes   Breast cancer Maternal Aunt  Breast cancer Maternal Aunt    Colon cancer Neg Hx    Social History   Socioeconomic History   Marital status: Married    Spouse name: Printmaker   Number of children: 2   Years of education: Not on file   Highest education level: Not on file  Occupational History   Occupation: housewife  Tobacco Use   Smoking status: Never Smoker   Smokeless tobacco: Never Used  Scientific laboratory technician Use: Never used  Substance and Sexual Activity   Alcohol use: No    Alcohol/week: 0.0 standard drinks   Drug use: No   Sexual activity: Yes    Partners: Male    Birth control/protection: Surgical    Comment: Hysterectomy  Other Topics Concern    Not on file  Social History Narrative   Not on file   Social Determinants of Health   Financial Resource Strain: High Risk   Difficulty of Paying Living Expenses: Very hard  Food Insecurity: No Food Insecurity   Worried About Charity fundraiser in the Last Year: Never true   Ran Out of Food in the Last Year: Never true  Transportation Needs: No Transportation Needs   Lack of Transportation (Medical): No   Lack of Transportation (Non-Medical): No  Physical Activity: Sufficiently Active   Days of Exercise per Week: 7 days   Minutes of Exercise per Session: 30 min  Stress: Stress Concern Present   Feeling of Stress : Rather much  Social Connections:    Frequency of Communication with Friends and Family:    Frequency of Social Gatherings with Friends and Family:    Attends Religious Services:    Active Member of Clubs or Organizations:    Attends Archivist Meetings:    Marital Status:     Outpatient Encounter Medications as of 07/20/2019  Medication Sig   acetaminophen (TYLENOL) 325 MG tablet Take 650 mg by mouth every 6 (six) hours as needed.   ALPRAZolam (XANAX) 0.5 MG tablet TAKE 1 TABLET BY MOUTH THREE TIMES A DAY AS NEEDED   atorvastatin (LIPITOR) 20 MG tablet Take 1 tablet (20 mg total) by mouth daily.   calcium gluconate 500 MG tablet Take 500 mg by mouth daily.   Casanthranol-Docusate Sodium 30-100 MG CAPS Take 2 capsules by mouth daily.   Cholecalciferol (VITAMIN D) 2000 UNITS CAPS Take 1 capsule by mouth daily.   Ibuprofen (ADVIL PO) Take by mouth as needed.   Multiple Vitamins-Minerals (MULTIPLE VITAMINS/WOMENS PO) Take 1 tablet by mouth daily.   OVER THE COUNTER MEDICATION Fiber gummies   pantoprazole (PROTONIX) 40 MG tablet TAKE 1 TABLET BY MOUTH EVERY DAY   triamcinolone cream (KENALOG) 0.1 % APPLY TO RASK TWICE A DAY AS DIRECTED FOR 3 WEEKS   No facility-administered encounter medications on file as of 07/20/2019.    Activities of Daily Living In  your present state of health, do you have any difficulty performing the following activities: 07/20/2019  Hearing? N  Vision? N  Difficulty concentrating or making decisions? N  Walking or climbing stairs? N  Dressing or bathing? N  Doing errands, shopping? N  Preparing Food and eating ? N  Using the Toilet? N  In the past six months, have you accidently leaked urine? Y  Comment only when lauging  Do you have problems with loss of bowel control? N  Managing your Medications? N  Managing your Finances? N  Housekeeping or managing your Housekeeping? N  Some  recent data might be hidden    Patient Care Team: Tower, Wynelle Fanny, MD as PCP - General (Family Medicine) Ralene Bathe, MD as Consulting Physician (Ophthalmology) Regina Eck, CNM as Referring Physician (Certified Nurse Midwife)    Assessment:   This is a routine wellness examination for Strawn.  Exercise Activities and Dietary recommendations Current Exercise Habits: Home exercise routine, Type of exercise: Other - see comments (leg exercises), Time (Minutes): 30, Frequency (Times/Week): 7, Weekly Exercise (Minutes/Week): 210, Intensity: Moderate, Exercise limited by: None identified  Goals      Increase physical activity     Starting 07/19/2018, I will continue to walk at 60 minutes daily.      Patient Stated     07/20/2019, I will continue to do leg exercises everyday for 30 minutes.        Fall Risk Fall Risk  07/20/2019 07/19/2018 06/29/2017 06/18/2016 06/18/2015  Falls in the past year? 0 0 No Yes Yes  Comment - - - pt reports bruising related to fall; fall occurred when patient slipped while getting out of shower -  Number falls in past yr: 0 - - 1 1  Injury with Fall? 0 - - Yes Yes  Risk Factor Category  - - - - -  Risk for fall due to : No Fall Risks - - - -  Follow up Falls evaluation completed;Falls prevention discussed - - Falls prevention discussed Falls evaluation completed  Comment - - - pt verbalized  she will always put down a towel on bathroom floor to reduce risk of falls -   Is the patient's home free of loose throw rugs in walkways, pet beds, electrical cords, etc?   yes      Grab bars in the bathroom? no      Handrails on the stairs?   yes      Adequate lighting?   yes  Timed Get Up and Go performed: N/A  Depression Screen PHQ 2/9 Scores 07/20/2019 07/19/2018 06/29/2017 06/18/2016  PHQ - 2 Score 0 0 0 0  PHQ- 9 Score 0 0 0 -     Cognitive Function MMSE - Mini Mental State Exam 07/20/2019 07/19/2018 06/29/2017 06/18/2016 06/18/2015  Orientation to time 5 5 5 5 5   Orientation to Place 5 5 5 5 5   Registration 3 3 3 3 3   Attention/ Calculation 5 0 0 0 0  Recall 3 3 3 3 3   Language- name 2 objects - 0 0 0 0  Language- repeat 1 1 1 1 1   Language- follow 3 step command - 0 3 3 3   Language- read & follow direction - 0 0 0 0  Write a sentence - 0 0 0 0  Copy design - 0 0 0 0  Total score - 17 20 20 20   Mini Cog  Mini-Cog screen was completed. Maximum score is 22. A value of 0 denotes this part of the MMSE was not completed or the patient failed this part of the Mini-Cog screening.       Immunization History  Administered Date(s) Administered   Fluad Quad(high Dose 65+) 10/13/2018   Influenza Split 11/01/2010, 10/23/2011   Influenza Whole 10/10/2009   Influenza, High Dose Seasonal PF 10/23/2014, 11/11/2016, 10/27/2017   Influenza, Seasonal, Injecte, Preservative Fre 11/14/2015   Influenza,inj,Quad PF,6+ Mos 10/26/2012, 10/20/2013   PFIZER SARS-COV-2 Vaccination 03/26/2019, 04/18/2019   Pneumococcal Conjugate-13 06/04/2015   Pneumococcal Polysaccharide-23 10/20/2013   Td 03/04/2000, 03/20/2010   Zoster 12/11/2014  Qualifies for Shingles Vaccine: Yes  Screening Tests Health Maintenance  Topic Date Due   INFLUENZA VACCINE  09/10/2019   MAMMOGRAM  11/25/2019   TETANUS/TDAP  03/20/2020   COLONOSCOPY  12/18/2024   DEXA SCAN  Completed   COVID-19 Vaccine  Completed    Hepatitis C Screening  Completed   PNA vac Low Risk Adult  Completed    Cancer Screenings: Lung: Low Dose CT Chest recommended if Age 32-80 years, 30 pack-year currently smoking OR have quit w/in 15 years. Patient does not qualify. Breast:  Up to date on Mammogram: Yes, completed 11/25/2018   Up to date of Bone Density/Dexa: Yes, completed 11/22/2017 Colorectal: completed 12/19/2014  Additional Screenings:  Hepatitis C Screening: 06/18/2015     Plan:   Patient will continue to do leg exercises everyday for 30 minutes.    I have personally reviewed and noted the following in the patients chart:   Medical and social history Use of alcohol, tobacco or illicit drugs  Current medications and supplements Functional ability and status Nutritional status Physical activity Advanced directives List of other physicians Hospitalizations, surgeries, and ER visits in previous 12 months Vitals Screenings to include cognitive, depression, and falls Referrals and appointments  In addition, I have reviewed and discussed with patient certain preventive protocols, quality metrics, and best practice recommendations. A written personalized care plan for preventive services as well as general preventive health recommendations were provided to patient.     Andrez Grime, LPN  03/19/1386    I reviewed health advisor's note, was available for consultation, and agree with documentation and plan. Loura Pardon MD

## 2019-07-20 NOTE — Progress Notes (Signed)
PCP notes:  Health Maintenance: No gaps noted   Abnormal Screenings: none   Patient concerns: Discuss low level lupus diagnosis   Nurse concerns: none   Next PCP appt.: 07/25/2019 @ 10:45 am

## 2019-07-20 NOTE — Patient Instructions (Signed)
Tabitha Carlson , Thank you for taking time to come for your Medicare Wellness Visit. I appreciate your ongoing commitment to your health goals. Please review the following plan we discussed and let me know if I can assist you in the future.   Screening recommendations/referrals: Colonoscopy: Up to date, completed 12/19/2014 Mammogram: Up to date, completed 11/25/2018 Bone Density: Up to date, completed 11/22/2017 Recommended yearly ophthalmology/optometry visit for glaucoma screening and checkup Recommended yearly dental visit for hygiene and checkup  Vaccinations: Influenza vaccine: Up to date, completed 10/13/2018 Pneumococcal vaccine: Completed series Tdap vaccine: Up to date, completed 03/20/2010 Shingles vaccine: discussed    Advanced directives: Advance directive discussed with you today. Even though you declined this today please call our office should you change your mind and we can give you the proper paperwork for you to fill out.  Conditions/risks identified: hyperlipidemia  Next appointment: 07/25/2019 @ 10:45 am    Preventive Care 65 Years and Older, Female Preventive care refers to lifestyle choices and visits with your health care provider that can promote health and wellness. What does preventive care include?  A yearly physical exam. This is also called an annual well check.  Dental exams once or twice a year.  Routine eye exams. Ask your health care provider how often you should have your eyes checked.  Personal lifestyle choices, including:  Daily care of your teeth and gums.  Regular physical activity.  Eating a healthy diet.  Avoiding tobacco and drug use.  Limiting alcohol use.  Practicing safe sex.  Taking low-dose aspirin every day.  Taking vitamin and mineral supplements as recommended by your health care provider. What happens during an annual well check? The services and screenings done by your health care provider during your annual well check will  depend on your age, overall health, lifestyle risk factors, and family history of disease. Counseling  Your health care provider may ask you questions about your:  Alcohol use.  Tobacco use.  Drug use.  Emotional well-being.  Home and relationship well-being.  Sexual activity.  Eating habits.  History of falls.  Memory and ability to understand (cognition).  Work and work Statistician.  Reproductive health. Screening  You may have the following tests or measurements:  Height, weight, and BMI.  Blood pressure.  Lipid and cholesterol levels. These may be checked every 5 years, or more frequently if you are over 71 years old.  Skin check.  Lung cancer screening. You may have this screening every year starting at age 27 if you have a 30-pack-year history of smoking and currently smoke or have quit within the past 15 years.  Fecal occult blood test (FOBT) of the stool. You may have this test every year starting at age 73.  Flexible sigmoidoscopy or colonoscopy. You may have a sigmoidoscopy every 5 years or a colonoscopy every 10 years starting at age 61.  Hepatitis C blood test.  Hepatitis B blood test.  Sexually transmitted disease (STD) testing.  Diabetes screening. This is done by checking your blood sugar (glucose) after you have not eaten for a while (fasting). You may have this done every 1-3 years.  Bone density scan. This is done to screen for osteoporosis. You may have this done starting at age 79.  Mammogram. This may be done every 1-2 years. Talk to your health care provider about how often you should have regular mammograms. Talk with your health care provider about your test results, treatment options, and if necessary, the need  for more tests. Vaccines  Your health care provider may recommend certain vaccines, such as:  Influenza vaccine. This is recommended every year.  Tetanus, diphtheria, and acellular pertussis (Tdap, Td) vaccine. You may need a  Td booster every 10 years.  Zoster vaccine. You may need this after age 42.  Pneumococcal 13-valent conjugate (PCV13) vaccine. One dose is recommended after age 40.  Pneumococcal polysaccharide (PPSV23) vaccine. One dose is recommended after age 22. Talk to your health care provider about which screenings and vaccines you need and how often you need them. This information is not intended to replace advice given to you by your health care provider. Make sure you discuss any questions you have with your health care provider. Document Released: 02/22/2015 Document Revised: 10/16/2015 Document Reviewed: 11/27/2014 Elsevier Interactive Patient Education  2017 [place] Prevention in the Home Falls can cause injuries. They can happen to people of all ages. There are many things you can do to make your home safe and to help prevent falls. What can I do on the outside of my home?  Regularly fix the edges of walkways and driveways and fix any cracks.  Remove anything that might make you trip as you walk through a door, such as a raised step or threshold.  Trim any bushes or trees on the path to your home.  Use bright outdoor lighting.  Clear any walking paths of anything that might make someone trip, such as rocks or tools.  Regularly check to see if handrails are loose or broken. Make sure that both sides of any steps have handrails.  Any raised decks and porches should have guardrails on the edges.  Have any leaves, snow, or ice cleared regularly.  Use sand or salt on walking paths during winter.  Clean up any spills in your garage right away. This includes oil or grease spills. What can I do in the bathroom?  Use night lights.  Install grab bars by the toilet and in the tub and shower. Do not use towel bars as grab bars.  Use non-skid mats or decals in the tub or shower.  If you need to sit down in the shower, use a plastic, non-slip stool.  Keep the floor dry. Clean  up any water that spills on the floor as soon as it happens.  Remove soap buildup in the tub or shower regularly.  Attach bath mats securely with double-sided non-slip rug tape.  Do not have throw rugs and other things on the floor that can make you trip. What can I do in the bedroom?  Use night lights.  Make sure that you have a light by your bed that is easy to reach.  Do not use any sheets or blankets that are too big for your bed. They should not hang down onto the floor.  Have a firm chair that has side arms. You can use this for support while you get dressed.  Do not have throw rugs and other things on the floor that can make you trip. What can I do in the kitchen?  Clean up any spills right away.  Avoid walking on wet floors.  Keep items that you use a lot in easy-to-reach places.  If you need to reach something above you, use a strong step stool that has a grab bar.  Keep electrical cords out of the way.  Do not use floor polish or wax that makes floors slippery. If you must use wax, use  non-skid floor wax.  Do not have throw rugs and other things on the floor that can make you trip. What can I do with my stairs?  Do not leave any items on the stairs.  Make sure that there are handrails on both sides of the stairs and use them. Fix handrails that are broken or loose. Make sure that handrails are as long as the stairways.  Check any carpeting to make sure that it is firmly attached to the stairs. Fix any carpet that is loose or worn.  Avoid having throw rugs at the top or bottom of the stairs. If you do have throw rugs, attach them to the floor with carpet tape.  Make sure that you have a light switch at the top of the stairs and the bottom of the stairs. If you do not have them, ask someone to add them for you. What else can I do to help prevent falls?  Wear shoes that:  Do not have high heels.  Have rubber bottoms.  Are comfortable and fit you well.  Are  closed at the toe. Do not wear sandals.  If you use a stepladder:  Make sure that it is fully opened. Do not climb a closed stepladder.  Make sure that both sides of the stepladder are locked into place.  Ask someone to hold it for you, if possible.  Clearly mark and make sure that you can see:  Any grab bars or handrails.  First and last steps.  Where the edge of each step is.  Use tools that help you move around (mobility aids) if they are needed. These include:  Canes.  Walkers.  Scooters.  Crutches.  Turn on the lights when you go into a dark area. Replace any light bulbs as soon as they burn out.  Set up your furniture so you have a clear path. Avoid moving your furniture around.  If any of your floors are uneven, fix them.  If there are any pets around you, be aware of where they are.  Review your medicines with your doctor. Some medicines can make you feel dizzy. This can increase your chance of falling. Ask your doctor what other things that you can do to help prevent falls. This information is not intended to replace advice given to you by your health care provider. Make sure you discuss any questions you have with your health care provider. Document Released: 11/22/2008 Document Revised: 07/04/2015 Document Reviewed: 03/02/2014 Elsevier Interactive Patient Education  2017 [employer].

## 2019-07-25 ENCOUNTER — Ambulatory Visit (INDEPENDENT_AMBULATORY_CARE_PROVIDER_SITE_OTHER): Payer: Medicare HMO | Admitting: Family Medicine

## 2019-07-25 ENCOUNTER — Other Ambulatory Visit: Payer: Self-pay

## 2019-07-25 ENCOUNTER — Encounter: Payer: Self-pay | Admitting: Family Medicine

## 2019-07-25 VITALS — BP 138/80 | HR 77 | Temp 96.8°F | Ht 65.0 in | Wt 149.0 lb

## 2019-07-25 DIAGNOSIS — E7800 Pure hypercholesterolemia, unspecified: Secondary | ICD-10-CM

## 2019-07-25 DIAGNOSIS — R69 Illness, unspecified: Secondary | ICD-10-CM | POA: Diagnosis not present

## 2019-07-25 DIAGNOSIS — F411 Generalized anxiety disorder: Secondary | ICD-10-CM

## 2019-07-25 DIAGNOSIS — R030 Elevated blood-pressure reading, without diagnosis of hypertension: Secondary | ICD-10-CM | POA: Diagnosis not present

## 2019-07-25 DIAGNOSIS — Z0000 Encounter for general adult medical examination without abnormal findings: Secondary | ICD-10-CM | POA: Diagnosis not present

## 2019-07-25 DIAGNOSIS — M85859 Other specified disorders of bone density and structure, unspecified thigh: Secondary | ICD-10-CM | POA: Diagnosis not present

## 2019-07-25 DIAGNOSIS — K219 Gastro-esophageal reflux disease without esophagitis: Secondary | ICD-10-CM

## 2019-07-25 MED ORDER — ATORVASTATIN CALCIUM 20 MG PO TABS: 20.0000 mg | ORAL_TABLET | Freq: Every day | ORAL | 3 refills | Status: DC

## 2019-07-25 MED ORDER — PANTOPRAZOLE SODIUM 40 MG PO TBEC: 40.0000 mg | DELAYED_RELEASE_TABLET | Freq: Every day | ORAL | 3 refills | Status: DC

## 2019-07-25 NOTE — Assessment & Plan Note (Signed)
Reviewed health habits including diet and exercise and skin cancer prevention Reviewed appropriate screening tests for age  Also reviewed health mt list, fam hx and immunization status , as well as social and family history   See HPI amw reviewed  Immunized for covid 19 Discussed shingrix vaccine  dexa will be due in fall at [place] [facility]  utd derm visit

## 2019-07-25 NOTE — Assessment & Plan Note (Signed)
Controlled as long as she takes protonix and watches diet

## 2019-07-25 NOTE — Progress Notes (Signed)
Subjective:    Patient ID: Tabitha Carlson, female    DOB: 1945/12/08, 74 y.o.   MRN: [id number]  This visit occurred during the SARS-CoV-2 public health emergency.  Safety protocols were in place, including screening questions prior to the visit, additional usage of staff PPE, and extensive cleaning of exam room while observing appropriate contact time as indicated for disinfecting solutions.    HPI Here for health maintenance exam and to review chronic medical problems    Wt Readings from Last 3 Encounters:  07/25/19 149 lb (67.6 kg)  03/22/19 146 lb 6 oz (66.4 kg)  07/22/18 143 lb (64.9 kg)   24.79 kg/m Exercise - walking /uses an exercise ball  Has a treadmill  Housework also   Had amw on 6/10  No gaps   covid status-immunized Zoster status-zostavax 11/16  ? Interested in shingrix  Mammogram 10/20  Self breast exam - no lumps or changes   Colonoscopy 11/16  10 y recall   dexa 10/19 -osteopenia slt improved  Falls-none  Fractures -none  Supplements - taking D and ca   Exercise - walking   Takes protonix for GERD Still doing ok as long as she takes it  Also gastroparesis   Skin cancer screening  She tested pos for lupus from her rash (just skin/no other symptoms) Continues to f/u with Dr Ubaldo Glassing  No new skin cancers   Elevated bp  BP Readings from Last 3 Encounters:  07/25/19 (!) 146/92  03/22/19 140/80  07/22/18 118/80  at home it is lower-in normal range  Gets anxious here  Watches sodium in diet   No known HTN in the family    Mood /history of anxiety - about the same  Takes xanax      Lab Results  Component Value Date   CREATININE 0.95 07/20/2019   BUN 21 07/20/2019   NA 139 07/20/2019   K 4.2 07/20/2019   CL 102 07/20/2019   CO2 31 07/20/2019   gfr 57.7 Lab Results  Component Value Date   ALT 20 07/20/2019   AST 21 07/20/2019   ALKPHOS 72 07/20/2019   BILITOT 0.5 07/20/2019   Lab Results  Component Value Date   WBC 4.8  07/20/2019   HGB 13.9 07/20/2019   HCT 41.5 07/20/2019   MCV 91.5 07/20/2019   PLT 197.0 07/20/2019   Lab Results  Component Value Date   TSH 3.37 07/20/2019     Cholesterol  Lab Results  Component Value Date   CHOL 169 07/20/2019   CHOL 180 07/19/2018   CHOL 153 06/29/2017   Lab Results  Component Value Date   HDL 50.90 07/20/2019   HDL 54.40 07/19/2018   HDL 58.40 06/29/2017   Lab Results  Component Value Date   LDLCALC 93 07/20/2019   LDLCALC 102 (H) 07/19/2018   LDLCALC 77 06/29/2017   Lab Results  Component Value Date   TRIG 126.0 07/20/2019   TRIG 118.0 07/19/2018   TRIG 86.0 06/29/2017   Lab Results  Component Value Date   CHOLHDL 3 07/20/2019   CHOLHDL 3 07/19/2018   CHOLHDL 3 06/29/2017   No results found for: LDLDIRECT Good cholesterol  Diet is good  Takes atorvastatin   Patient Active Problem List   Diagnosis Date Noted  . Rash and nonspecific skin eruption 03/22/2019  . Elevated blood pressure reading 03/22/2019  . Medicare annual wellness visit, subsequent 06/29/2017  . Estrogen deficiency 06/04/2015  . Routine general medical examination at  a health care facility 05/18/2015  . Encounter for Medicare annual wellness exam 05/18/2014  . Osteopenia 10/20/2013  . Gastroparesis 01/02/2008  . ALLERGIC RHINITIS 09/12/2007  . COLONIC POLYPS 03/16/2007  . GERD 03/16/2007  . Hyperlipidemia 02/21/2007  . Anxiety state 02/21/2007  . IBS 02/21/2007  . DEGENERATIVE JOINT DISEASE 02/21/2007   Past Medical History:  Diagnosis Date  . Allergic rhinitis   . Anxiety   . Arthritis    in back. Physical Therapy 2016  . Blood donor   . Cancer ([place])    skin on back  . Depression   . DJD (degenerative joint disease)   . Family history of ischemic heart disease   . Gastroparesis   . GERD (gastroesophageal reflux disease)   . Hx of colonic polyps   . Hypercholesteremia   . Hyperlipidemia   . IBS (irritable bowel syndrome)    Past Surgical History:   Procedure Laterality Date  . ABDOMINAL HYSTERECTOMY  1985   TAH/BSO Age 48 DUB, pelvic pain  . BREAST EXCISIONAL BIOPSY Left 1980, 1983   Benign Breast Biospy x 2  . CYST EXCISION  04/2011   Thigh  . left breast biopsy     benign x 2  . SKIN CANCER EXCISION     on back  . SKIN SURGERY     removal of small skin on face   Social History   Tobacco Use  . Smoking status: Never Smoker  . Smokeless tobacco: Never Used  Vaping Use  . Vaping Use: Never used  Substance Use Topics  . Alcohol use: No    Alcohol/week: 0.0 standard drinks  . Drug use: No   Family History  Problem Relation Age of Onset  . Leukemia Mother   . Hyperlipidemia Sister   . Other Sister        2 cardiac episodes  . Breast cancer Maternal Aunt   . Breast cancer Maternal Aunt   . Colon cancer Neg Hx    Allergies  Allergen Reactions  . Aspirin     Unable to take a full aspirin--causes palpitations Tachycardia  . Crab [Shellfish Allergy]     Rash from head to toe   Current Outpatient Medications on File Prior to Visit  Medication Sig Dispense Refill  . acetaminophen (TYLENOL) 325 MG tablet Take 650 mg by mouth every 6 (six) hours as needed.    . ALPRAZolam (XANAX) 0.5 MG tablet TAKE 1 TABLET BY MOUTH THREE TIMES A DAY AS NEEDED 90 tablet 0  . calcium gluconate 500 MG tablet Take 500 mg by mouth daily.    [name] Sodium 30-100 MG CAPS Take 2 capsules by mouth daily.    . Cholecalciferol (VITAMIN D) 2000 UNITS CAPS Take 1 capsule by mouth daily.    . Ibuprofen (ADVIL PO) Take by mouth as needed.    . Multiple Vitamins-Minerals (MULTIPLE VITAMINS/WOMENS PO) Take 1 tablet by mouth daily.    Marland Kitchen OVER THE COUNTER MEDICATION Fiber gummies    . triamcinolone cream (KENALOG) 0.1 % APPLY TO RASK TWICE A DAY AS DIRECTED FOR 3 WEEKS     No current facility-administered medications on file prior to visit.     Review of Systems  Constitutional: Negative for activity change, appetite change,  fatigue, fever and unexpected weight change.  HENT: Negative for congestion, ear pain, rhinorrhea, sinus pressure and sore throat.   Eyes: Negative for pain, redness and visual disturbance.  Respiratory: Negative for cough, shortness of breath and  wheezing.   Cardiovascular: Negative for chest pain and palpitations.  Gastrointestinal: Negative for abdominal pain, blood in stool, constipation and diarrhea.  Endocrine: Negative for polydipsia and polyuria.  Genitourinary: Negative for dysuria, frequency and urgency.  Musculoskeletal: Negative for arthralgias, back pain and myalgias.  Skin: Negative for pallor and rash.  Allergic/Immunologic: Negative for environmental allergies.  Neurological: Negative for dizziness, syncope and headaches.  Hematological: Negative for adenopathy. Does not bruise/bleed easily.  Psychiatric/Behavioral: Negative for decreased concentration and dysphoric mood. The patient is nervous/anxious.        Objective:   Physical Exam Constitutional:      General: She is not in acute distress.    Appearance: Normal appearance. She is well-developed and normal weight. She is not ill-appearing or diaphoretic.  HENT:     Head: Normocephalic and atraumatic.     Right Ear: Tympanic membrane, ear canal and external ear normal.     Left Ear: Tympanic membrane, ear canal and external ear normal.     Nose: Nose normal. No congestion.     Mouth/Throat:     Mouth: Mucous membranes are moist.     Pharynx: Oropharynx is clear. No posterior oropharyngeal erythema.  Eyes:     General: No scleral icterus.    Extraocular Movements: Extraocular movements intact.     Conjunctiva/sclera: Conjunctivae normal.     Pupils: Pupils are equal, round, and reactive to light.  Neck:     Thyroid: No thyromegaly.     Vascular: No carotid bruit or JVD.  Cardiovascular:     Rate and Rhythm: Normal rate and regular rhythm.     Pulses: Normal pulses.     Heart sounds: Normal heart sounds. No  gallop.   Pulmonary:     Effort: Pulmonary effort is normal. No respiratory distress.     Breath sounds: Normal breath sounds. No wheezing.     Comments: Good air exch Chest:     Chest wall: No tenderness.  Abdominal:     General: Bowel sounds are normal. There is no distension or abdominal bruit.     Palpations: Abdomen is soft. There is no mass.     Tenderness: There is no abdominal tenderness.     Hernia: No hernia is present.  Genitourinary:    Comments: Breast exam: No mass, nodules, thickening, tenderness, bulging, retraction, inflamation, nipple discharge or skin changes noted.  No axillary or clavicular LA.     Musculoskeletal:        General: No tenderness. Normal range of motion.     Cervical back: Normal range of motion and neck supple. No rigidity. No muscular tenderness.     Right lower leg: No edema.     Left lower leg: No edema.     Comments: No kyphosis   Lymphadenopathy:     Cervical: No cervical adenopathy.  Skin:    General: Skin is warm and dry.     Coloration: Skin is not pale.     Findings: No erythema or rash.     Comments: Solar lentigines diffusely   Neurological:     Mental Status: She is alert. Mental status is at baseline.     Cranial Nerves: No cranial nerve deficit.     Motor: No abnormal muscle tone.     Coordination: Coordination normal.     Gait: Gait normal.     Deep Tendon Reflexes: Reflexes are normal and symmetric. Reflexes normal.  Psychiatric:        Mood and  Affect: Mood normal.        Cognition and Memory: Cognition and memory normal.           Assessment & Plan:   Problem List Items Addressed This Visit      Digestive   GERD    Controlled as long as she takes protonix and watches diet      Relevant Medications   pantoprazole (PROTONIX) 40 MG tablet     Musculoskeletal and Integument   Osteopenia    dexa 2019  No falls/fx Improved last check  Enc exercise  Taking ca and D        Other   Hyperlipidemia     Well controlled with atorvastatin and diet  Disc goals for lipids and reasons to control them Rev last labs with pt Rev low sat fat diet in detail        Relevant Medications   atorvastatin (LIPITOR) 20 MG tablet   Anxiety state    Uses xanax No falls/dizziness or issues with habit at this point Enc pt to consider alt tx in the future as she ages        Routine general medical examination at a health care facility - Primary    Reviewed health habits including diet and exercise and skin cancer prevention Reviewed appropriate screening tests for age  Also reviewed health mt list, fam hx and immunization status , as well as social and family history   See HPI amw reviewed  Immunized for covid 19 Discussed shingrix vaccine  dexa will be due in fall at [place] [facility]  utd derm visit        Elevated blood pressure reading    BP: 138/80  Improved 2nd check  Will continue to check at home and bring cuff to next visit Per pt much lower at home and gets anx here

## 2019-07-25 NOTE — Assessment & Plan Note (Signed)
BP: 138/80  Improved 2nd check  Will continue to check at home and bring cuff to next visit Per pt much lower at home and gets anx here

## 2019-07-25 NOTE — Assessment & Plan Note (Signed)
Well controlled with atorvastatin and diet Disc goals for lipids and reasons to control them Rev last labs with pt Rev low sat fat diet in detail  

## 2019-07-25 NOTE — Assessment & Plan Note (Signed)
dexa 2019  No falls/fx Improved last check  Enc exercise  Taking ca and D

## 2019-07-25 NOTE — Assessment & Plan Note (Addendum)
Uses xanax No falls/dizziness or issues with habit at this point Enc pt to consider alt tx in the future as she ages

## 2019-07-25 NOTE — Patient Instructions (Addendum)
If you are interested in the new shingles vaccine (Shingrix) - call your local pharmacy to check on coverage and availability  If affordable, get on a wait list at your pharmacy to get the vaccine.  Keep checking your blood pressure at home  Bring your cuff to the next visit  Continue healthy habits   Take care of yourself   We can order bone density test after Nov 1

## 2019-08-12 ENCOUNTER — Other Ambulatory Visit: Payer: Self-pay

## 2019-08-12 ENCOUNTER — Encounter (HOSPITAL_COMMUNITY): Payer: Self-pay

## 2019-08-12 ENCOUNTER — Ambulatory Visit (HOSPITAL_COMMUNITY)
Admission: EM | Admit: 2019-08-12 | Discharge: 2019-08-12 | Disposition: A | Discharge status: Home / Self Care | Payer: Medicare HMO | Attending: Urgent Care | Admitting: Urgent Care

## 2019-08-12 DIAGNOSIS — N3001 Acute cystitis with hematuria: Secondary | ICD-10-CM | POA: Insufficient documentation

## 2019-08-12 DIAGNOSIS — N390 Urinary tract infection, site not specified: Secondary | ICD-10-CM

## 2019-08-12 DIAGNOSIS — R350 Frequency of micturition: Secondary | ICD-10-CM | POA: Diagnosis not present

## 2019-08-12 DIAGNOSIS — R300 Dysuria: Secondary | ICD-10-CM | POA: Diagnosis not present

## 2019-08-12 LAB — POCT URINALYSIS DIP (DEVICE)
Bilirubin Urine: NEGATIVE
Glucose, UA: NEGATIVE mg/dL
Ketones, ur: NEGATIVE mg/dL
Nitrite: NEGATIVE
Protein, ur: NEGATIVE mg/dL
Specific Gravity, Urine: <=1.005 (ref 1.005–1.030)
Urobilinogen, UA: 0.2 mg/dL (ref 0.0–1.0)
pH: 5.5 (ref 5.0–8.0)

## 2019-08-12 MED ORDER — CEPHALEXIN 500 MG PO CAPS: 500.0000 mg | ORAL_CAPSULE | Freq: Two times a day (BID) | ORAL | 0 refills | Status: DC

## 2019-08-12 NOTE — ED Provider Notes (Signed)
[facility]   MRN: [id number] DOB: April 02, 1945  Subjective:   Tabitha Carlson is a 74 y.o. female presenting for day hx of urinary frequency, dysuria, hematuria. Has been traveling, not hydrating as well as she normally does.  Has a history of UTIs, last episode was in 2017.  Denies fever, nausea, vomiting, pelvic pain, flank pain.  The hematuria improved somewhat today but she has been hydrating significantly.  No current facility-administered medications for this encounter.  Current Outpatient Medications:  .  acetaminophen (TYLENOL) 325 MG tablet, Take 650 mg by mouth every 6 (six) hours as needed., Disp: , Rfl:  .  ALPRAZolam (XANAX) 0.5 MG tablet, TAKE 1 TABLET BY MOUTH THREE TIMES A DAY AS NEEDED, Disp: 90 tablet, Rfl: 0 .  atorvastatin (LIPITOR) 20 MG tablet, Take 1 tablet (20 mg total) by mouth daily., Disp: 90 tablet, Rfl: 3 .  calcium gluconate 500 MG tablet, Take 500 mg by mouth daily., Disp: , Rfl:  .  Casanthranol-Docusate Sodium 30-100 MG CAPS, Take 2 capsules by mouth daily., Disp: , Rfl:  .  Cholecalciferol (VITAMIN D) 2000 UNITS CAPS, Take 1 capsule by mouth daily., Disp: , Rfl:  .  Ibuprofen (ADVIL PO), Take by mouth as needed., Disp: , Rfl:  .  Multiple Vitamins-Minerals (MULTIPLE VITAMINS/WOMENS PO), Take 1 tablet by mouth daily., Disp: , Rfl:  .  OVER THE COUNTER MEDICATION, Fiber gummies, Disp: , Rfl:  .  pantoprazole (PROTONIX) 40 MG tablet, Take 1 tablet (40 mg total) by mouth daily., Disp: 90 tablet, Rfl: 3 .  triamcinolone cream (KENALOG) 0.1 %, APPLY TO RASK TWICE A DAY AS DIRECTED FOR 3 WEEKS, Disp: , Rfl:    Allergies  Allergen Reactions  . Aspirin     Unable to take a full aspirin--causes palpitations Tachycardia  . Crab [Shellfish Allergy]     Rash from head to toe    Past Medical History:  Diagnosis Date  . Allergic rhinitis   . Anxiety   . Arthritis    in back. Physical Therapy 2016  . Blood donor   . Cancer ([place])    skin on back  .  Depression   . DJD (degenerative joint disease)   . Family history of ischemic heart disease   . Gastroparesis   . GERD (gastroesophageal reflux disease)   . Hx of colonic polyps   . Hypercholesteremia   . Hyperlipidemia   . IBS (irritable bowel syndrome)      Past Surgical History:  Procedure Laterality Date  . ABDOMINAL HYSTERECTOMY  1985   TAH/BSO Age 16 DUB, pelvic pain  . BREAST EXCISIONAL BIOPSY Left 1980, 1983   Benign Breast Biospy x 2  . CYST EXCISION  04/2011   Thigh  . left breast biopsy     benign x 2  . SKIN CANCER EXCISION     on back  . SKIN SURGERY     removal of small skin on face    Family History  Problem Relation Age of Onset  . Leukemia Mother   . Hyperlipidemia Sister   . Other Sister        2 cardiac episodes  . Breast cancer Maternal Aunt   . Breast cancer Maternal Aunt   . Colon cancer Neg Hx     Social History   Tobacco Use  . Smoking status: Never Smoker  . Smokeless tobacco: Never Used  Vaping Use  . Vaping Use: Never used  Substance Use Topics  .  Alcohol use: No    Alcohol/week: 0.0 standard drinks  . Drug use: No    ROS   Objective:   Vitals: BP (!) 164/73   Pulse 87   Temp 98.1 F (36.7 C) (Oral)   Resp 16   Ht 5\' 5"  (1.651 m)   Wt 149 lb (67.6 kg)   SpO2 98%   BMI 24.79 kg/m   Physical Exam Constitutional:      General: She is not in acute distress.    Appearance: Normal appearance. She is well-developed. She is not ill-appearing, toxic-appearing or diaphoretic.  HENT:     Head: Normocephalic and atraumatic.     Nose: Nose normal.     Mouth/Throat:     Mouth: Mucous membranes are moist.     Pharynx: Oropharynx is clear.  Eyes:     General: No scleral icterus.       Right eye: No discharge.        Left eye: No discharge.     Extraocular Movements: Extraocular movements intact.     Conjunctiva/sclera: Conjunctivae normal.     Pupils: Pupils are equal, round, and reactive to light.  Cardiovascular:      Rate and Rhythm: Normal rate.  Pulmonary:     Effort: Pulmonary effort is normal.  Abdominal:     General: Bowel sounds are normal. There is no distension.     Palpations: Abdomen is soft. There is no mass.     Tenderness: There is no abdominal tenderness. There is no right CVA tenderness, left CVA tenderness, guarding or rebound.  Skin:    General: Skin is warm and dry.  Neurological:     General: No focal deficit present.     Mental Status: She is alert and oriented to person, place, and time.  Psychiatric:        Mood and Affect: Mood normal.        Behavior: Behavior normal.        Thought Content: Thought content normal.        Judgment: Judgment normal.     Results for orders placed or performed during the hospital encounter of 08/12/19 (from the past 24 hour(s))  POCT urinalysis dip (device)     Status: Abnormal   Collection Time: 08/12/19  1:20 PM  Result Value Ref Range   Glucose, UA NEGATIVE NEGATIVE mg/dL   Bilirubin Urine NEGATIVE NEGATIVE   Ketones, ur NEGATIVE NEGATIVE mg/dL   Specific Gravity, Urine <=1.005 1.005 - 1.030   Hgb urine dipstick TRACE (A) NEGATIVE   pH 5.5 5.0 - 8.0   Protein, ur NEGATIVE NEGATIVE mg/dL   Urobilinogen, UA 0.2 0.0 - 1.0 mg/dL   Nitrite NEGATIVE NEGATIVE   Leukocytes,Ua SMALL (A) NEGATIVE    Assessment and Plan :   PDMP not reviewed this encounter.  1. Acute cystitis with hematuria   2. Dysuria   3. Urinary frequency     Start Keflex twice daily, hydrate very well.  Urine culture pending. Counseled patient on potential for adverse effects with medications prescribed/recommended today, ER and return-to-clinic precautions discussed, patient verbalized understanding.    Jaynee Eagles, PA-C 08/12/19 1344

## 2019-08-12 NOTE — ED Triage Notes (Signed)
Pt c/o dysuria and hematuria since last night.

## 2019-08-13 LAB — URINE CULTURE: Culture: <10000 — AB

## 2019-08-14 ENCOUNTER — Other Ambulatory Visit: Payer: Self-pay | Admitting: Family Medicine

## 2019-08-15 NOTE — Telephone Encounter (Signed)
Requesting: Xanax 0.5 mg Contract:N/A UDS:N/A Last Visit:07/25/19 Next Visit:07/26/2020 Last Refill:06/13/2019  Please Advise

## 2019-10-23 DIAGNOSIS — R69 Illness, unspecified: Secondary | ICD-10-CM | POA: Diagnosis not present

## 2019-10-26 DIAGNOSIS — R69 Illness, unspecified: Secondary | ICD-10-CM | POA: Diagnosis not present

## 2019-10-31 ENCOUNTER — Other Ambulatory Visit: Payer: Self-pay | Admitting: Family Medicine

## 2019-11-01 NOTE — Telephone Encounter (Signed)
Name of Medication: Xanax Name of Pharmacy: [facility]/ [place] or Written Date and Quantity: 08/15/19 #90 tabs/ 0 refills Last Office Visit and Type: CPE 07/25/19 Next Office Visit and Type: CPE 07/26/20

## 2019-11-06 ENCOUNTER — Other Ambulatory Visit: Payer: Self-pay | Admitting: Family Medicine

## 2019-11-06 DIAGNOSIS — Z1231 Encounter for screening mammogram for malignant neoplasm of breast: Secondary | ICD-10-CM

## 2019-11-29 ENCOUNTER — Ambulatory Visit
Admission: RE | Admit: 2019-11-29 | Discharge: 2019-11-29 | Disposition: A | Discharge status: Home / Self Care | Payer: Medicare HMO | Source: Ambulatory Visit | Attending: Family Medicine | Admitting: Family Medicine

## 2019-11-29 ENCOUNTER — Other Ambulatory Visit: Payer: Self-pay

## 2019-11-29 DIAGNOSIS — Z1231 Encounter for screening mammogram for malignant neoplasm of breast: Secondary | ICD-10-CM

## 2019-11-30 DIAGNOSIS — H52203 Unspecified astigmatism, bilateral: Secondary | ICD-10-CM | POA: Diagnosis not present

## 2019-11-30 DIAGNOSIS — H2513 Age-related nuclear cataract, bilateral: Secondary | ICD-10-CM | POA: Diagnosis not present

## 2019-11-30 DIAGNOSIS — H5213 Myopia, bilateral: Secondary | ICD-10-CM | POA: Diagnosis not present

## 2019-12-18 DIAGNOSIS — H52203 Unspecified astigmatism, bilateral: Secondary | ICD-10-CM | POA: Diagnosis not present

## 2019-12-18 DIAGNOSIS — H5213 Myopia, bilateral: Secondary | ICD-10-CM | POA: Diagnosis not present

## 2020-01-10 ENCOUNTER — Other Ambulatory Visit: Payer: Self-pay | Admitting: Family Medicine

## 2020-01-11 NOTE — Telephone Encounter (Signed)
Name of Medication: Xanax Name of Pharmacy: [facility]/ [place] or Written Date and Quantity: 11/01/19 #90 tabs/ 0 refills Last Office Visit and Type: CPE 07/25/19 Next Office Visit and Type: CPE 07/26/20

## 2020-04-09 ENCOUNTER — Other Ambulatory Visit: Payer: Self-pay | Admitting: Family Medicine

## 2020-04-09 NOTE — Telephone Encounter (Signed)
Pharmacy requests refill on: Alprazolam 0.5 mg   LAST REFILL: 01/11/2020 (Q-90, R-0) LAST OV: 07/25/2019 NEXT OV: 07/26/2020 PHARMACY: [id number] [place], [place]

## 2020-07-10 ENCOUNTER — Other Ambulatory Visit: Payer: Self-pay | Admitting: Family Medicine

## 2020-07-10 NOTE — Telephone Encounter (Signed)
Name of Medication: Xanax Name of Pharmacy: [facility] [place] or Written Date and Quantity: 04/09/20 #90 tabs 0 refills Last Office Visit and Type: CPE 07/25/19 Next Office Visit and Type: CPE 07/26/20

## 2020-07-18 ENCOUNTER — Telehealth: Payer: Self-pay | Admitting: Family Medicine

## 2020-07-18 ENCOUNTER — Other Ambulatory Visit: Payer: Self-pay | Admitting: Family Medicine

## 2020-07-18 DIAGNOSIS — E7800 Pure hypercholesterolemia, unspecified: Secondary | ICD-10-CM

## 2020-07-18 DIAGNOSIS — Z0000 Encounter for general adult medical examination without abnormal findings: Secondary | ICD-10-CM

## 2020-07-18 NOTE — Telephone Encounter (Signed)
-----   Message from Cloyd Stagers, RT sent at 07/01/2020  2:27 PM EDT ----- Regarding: Lab Orders for Friday 6.10.2022 Please place lab orders for Friday 6.10.2022, office visit for physical on  Friday 6.17.2022 Thank you, Dyke Maes RT(R)

## 2020-07-19 ENCOUNTER — Other Ambulatory Visit (INDEPENDENT_AMBULATORY_CARE_PROVIDER_SITE_OTHER): Payer: Medicare HMO

## 2020-07-19 ENCOUNTER — Other Ambulatory Visit: Payer: Self-pay

## 2020-07-19 DIAGNOSIS — Z0000 Encounter for general adult medical examination without abnormal findings: Secondary | ICD-10-CM | POA: Diagnosis not present

## 2020-07-19 DIAGNOSIS — E7800 Pure hypercholesterolemia, unspecified: Secondary | ICD-10-CM | POA: Diagnosis not present

## 2020-07-19 LAB — CBC WITH DIFFERENTIAL/PLATELET
Basophils Absolute: 0 10*3/uL (ref 0.0–0.1)
Basophils Relative: 0.4 % (ref 0.0–3.0)
Eosinophils Absolute: 0.3 10*3/uL (ref 0.0–0.7)
Eosinophils Relative: 6.9 % — ABNORMAL HIGH (ref 0.0–5.0)
HCT: 41.8 % (ref 36.0–46.0)
Hemoglobin: 13.9 g/dL (ref 12.0–15.0)
Lymphocytes Relative: 42.1 % (ref 12.0–46.0)
Lymphs Abs: 1.9 10*3/uL (ref 0.7–4.0)
MCHC: 33.3 g/dL (ref 30.0–36.0)
MCV: 90.7 fl (ref 78.0–100.0)
Monocytes Absolute: 0.4 10*3/uL (ref 0.1–1.0)
Monocytes Relative: 9.3 % (ref 3.0–12.0)
Neutro Abs: 1.9 10*3/uL (ref 1.4–7.7)
Neutrophils Relative %: 41.3 % — ABNORMAL LOW (ref 43.0–77.0)
Platelets: 174 10*3/uL (ref 150.0–400.0)
RBC: 4.61 Mil/uL (ref 3.87–5.11)
RDW: 13 % (ref 11.5–15.5)
WBC: 4.5 10*3/uL (ref 4.0–10.5)

## 2020-07-19 LAB — COMPREHENSIVE METABOLIC PANEL
ALT: 20 U/L (ref 0–35)
AST: 22 U/L (ref 0–37)
Albumin: 4.6 g/dL (ref 3.5–5.2)
Alkaline Phosphatase: 69 U/L (ref 39–117)
BUN: 19 mg/dL (ref 6–23)
CO2: 26 mEq/L (ref 19–32)
Calcium: 9.8 mg/dL (ref 8.4–10.5)
Chloride: 104 mEq/L (ref 96–112)
Creatinine, Ser: 0.98 mg/dL (ref 0.40–1.20)
GFR: 57.26 mL/min — ABNORMAL LOW (ref >60.00)
Glucose, Bld: 99 mg/dL (ref 70–99)
Potassium: 4.7 mEq/L (ref 3.5–5.1)
Sodium: 141 mEq/L (ref 135–145)
Total Bilirubin: 0.5 mg/dL (ref 0.2–1.2)
Total Protein: 7.2 g/dL (ref 6.0–8.3)

## 2020-07-19 LAB — LIPID PANEL
Cholesterol: 168 mg/dL (ref 0–200)
HDL: 54.6 mg/dL (ref >39.00)
LDL Cholesterol: 89 mg/dL (ref 0–99)
NonHDL: 113.77
Total CHOL/HDL Ratio: 3
Triglycerides: 125 mg/dL (ref 0.0–149.0)
VLDL: 25 mg/dL (ref 0.0–40.0)

## 2020-07-19 LAB — TSH: TSH: 3.52 u[IU]/mL (ref 0.35–4.50)

## 2020-07-22 ENCOUNTER — Ambulatory Visit: Payer: Medicare HMO

## 2020-07-26 ENCOUNTER — Other Ambulatory Visit: Payer: Self-pay

## 2020-07-26 ENCOUNTER — Ambulatory Visit (INDEPENDENT_AMBULATORY_CARE_PROVIDER_SITE_OTHER): Payer: Medicare HMO | Admitting: Family Medicine

## 2020-07-26 ENCOUNTER — Encounter: Payer: Self-pay | Admitting: Family Medicine

## 2020-07-26 VITALS — BP 144/86 | HR 75 | Temp 97.2°F | Ht 64.75 in | Wt 153.0 lb

## 2020-07-26 DIAGNOSIS — Z0000 Encounter for general adult medical examination without abnormal findings: Secondary | ICD-10-CM | POA: Diagnosis not present

## 2020-07-26 DIAGNOSIS — E7800 Pure hypercholesterolemia, unspecified: Secondary | ICD-10-CM | POA: Diagnosis not present

## 2020-07-26 DIAGNOSIS — M85859 Other specified disorders of bone density and structure, unspecified thigh: Secondary | ICD-10-CM

## 2020-07-26 DIAGNOSIS — K219 Gastro-esophageal reflux disease without esophagitis: Secondary | ICD-10-CM | POA: Diagnosis not present

## 2020-07-26 MED ORDER — ATORVASTATIN CALCIUM 20 MG PO TABS: 1.0000 | ORAL_TABLET | Freq: Every day | ORAL | 3 refills | Status: DC

## 2020-07-26 MED ORDER — PANTOPRAZOLE SODIUM 40 MG PO TBEC: 40.0000 mg | DELAYED_RELEASE_TABLET | Freq: Every day | ORAL | 3 refills | Status: DC

## 2020-07-26 NOTE — Patient Instructions (Addendum)
If you are interested in the new shingles vaccine (Shingrix) - call your local pharmacy to check on coverage and availability  If affordable, get on a wait list at your pharmacy to get the vaccine.  Call us when you are due for mammogram  I need to put in the bone density test order at that time so you can get that as well   Keep drinking lots of water for kidney health   Keep up with regular bathroom breaks to empty your bladder

## 2020-07-26 NOTE — Assessment & Plan Note (Signed)
Reviewed health habits including diet and exercise and skin cancer prevention Reviewed appropriate screening tests for age  Also reviewed health mt list, fam hx and immunization status , as well as social and family history   See HPI Labs reviewed  Discussed shingrix vaccine-plans to get in pharmacy if covered covid imm with booster Mammogram utd  Colonoscopy utd  dexa due-pt plans to get in oct with mammogram  No falls or fx and takes vit D

## 2020-07-26 NOTE — Assessment & Plan Note (Signed)
Disc goals for lipids and reasons to control them Rev last labs with pt Rev low sat fat diet in detail Well controlled with atorvastatin 20 mg daily and diet

## 2020-07-26 NOTE — Assessment & Plan Note (Signed)
dexa 10/19  Pt pref to do this with mamm in the fall-she will call to get that order in sept No falls or fx Good exercise habits Taking vit D

## 2020-07-26 NOTE — Progress Notes (Signed)
Subjective:    Patient ID: Tabitha Carlson, female    DOB: 15-Oct-1945, 75 y.o.   MRN: [id number]  This visit occurred during the SARS-CoV-2 public health emergency.  Safety protocols were in place, including screening questions prior to the visit, additional usage of staff PPE, and extensive cleaning of exam room while observing appropriate contact time as indicated for disinfecting solutions.   HPI Here for health maintenance exam and to review chronic medical problems    Wt Readings from Last 3 Encounters:  07/26/20 153 lb (69.4 kg)  08/12/19 149 lb (67.6 kg)  07/25/19 149 lb (67.6 kg)   25.66 kg/m  Amw is planned  Exercise- treadmill and step aerobic program  Weight also    Zoster status -interested in shingrix  Covid immunized wit hbooster Thinks she had covid in January   Mammogram 10/21 Self breast exam - no lumps   Colonoscopy 11/16  Dexa 10/19  -wants to get in fall with her mammogram [facility]  Supplements- vit D  Falls- none Fractures -none  Exercise -treadmill and uses weights    GERD-takes protonix  Watches diet  Eats healthy as well (occ ice cream)   Hyperlipidemia Lab Results  Component Value Date   CHOL 168 07/19/2020   CHOL 169 07/20/2019   CHOL 180 07/19/2018   Lab Results  Component Value Date   HDL 54.60 07/19/2020   HDL 50.90 07/20/2019   HDL 54.40 07/19/2018   Lab Results  Component Value Date   LDLCALC 89 07/19/2020   LDLCALC 93 07/20/2019   LDLCALC 102 (H) 07/19/2018   Lab Results  Component Value Date   TRIG 125.0 07/19/2020   TRIG 126.0 07/20/2019   TRIG 118.0 07/19/2018   Lab Results  Component Value Date   CHOLHDL 3 07/19/2020   CHOLHDL 3 07/20/2019   CHOLHDL 3 07/19/2018   No results found for: LDLDIRECT Takes atorvastatin 20 mg daily  Good control/stable   Other labs Results for orders placed or performed in visit on 07/19/20  TSH  Result Value Ref Range   TSH 3.52 0.35 - 4.50 uIU/mL  Lipid panel   Result Value Ref Range   Cholesterol 168 0 - 200 mg/dL   Triglycerides 125.0 0.0 - 149.0 mg/dL   HDL 54.60 >39.00 mg/dL   VLDL 25.0 0.0 - 40.0 mg/dL   LDL Cholesterol 89 0 - 99 mg/dL   Total CHOL/HDL Ratio 3    NonHDL 113.77   Comprehensive metabolic panel  Result Value Ref Range   Sodium 141 135 - 145 mEq/L   Potassium 4.7 3.5 - 5.1 mEq/L   Chloride 104 96 - 112 mEq/L   CO2 26 19 - 32 mEq/L   Glucose, Bld 99 70 - 99 mg/dL   BUN 19 6 - 23 mg/dL   Creatinine, Ser 0.98 0.40 - 1.20 mg/dL   Total Bilirubin 0.5 0.2 - 1.2 mg/dL   Alkaline Phosphatase 69 39 - 117 U/L   AST 22 0 - 37 U/L   ALT 20 0 - 35 U/L   Total Protein 7.2 6.0 - 8.3 g/dL   Albumin 4.6 3.5 - 5.2 g/dL   GFR 57.26 (L) >60.00 mL/min   Calcium 9.8 8.4 - 10.5 mg/dL  CBC with Differential/Platelet  Result Value Ref Range   WBC 4.5 4.0 - 10.5 K/uL   RBC 4.61 3.87 - 5.11 Mil/uL   Hemoglobin 13.9 12.0 - 15.0 g/dL   HCT 41.8 36.0 - 46.0 %   MCV  90.7 78.0 - 100.0 fl   MCHC 33.3 30.0 - 36.0 g/dL   RDW 13.0 11.5 - 15.5 %   Platelets 174.0 150.0 - 400.0 K/uL   Neutrophils Relative % 41.3 (L) 43.0 - 77.0 %   Lymphocytes Relative 42.1 12.0 - 46.0 %   Monocytes Relative 9.3 3.0 - 12.0 %   Eosinophils Relative 6.9 (H) 0.0 - 5.0 %   Basophils Relative 0.4 0.0 - 3.0 %   Neutro Abs 1.9 1.4 - 7.7 K/uL   Lymphs Abs 1.9 0.7 - 4.0 K/uL   Monocytes Absolute 0.4 0.1 - 1.0 K/uL   Eosinophils Absolute 0.3 0.0 - 0.7 K/uL   Basophils Absolute 0.0 0.0 - 0.1 K/uL    Mood is good  Takes xanax infrequently for anxiety   Has had a few uti  Usually if she has to hold urine for a long time   Patient Active Problem List   Diagnosis Date Noted   Elevated blood pressure reading 03/22/2019   Medicare annual wellness visit, subsequent 06/29/2017   Estrogen deficiency 06/04/2015   Routine general medical examination at a health care facility 05/18/2015   Encounter for Medicare annual wellness exam 05/18/2014   Osteopenia 10/20/2013    Gastroparesis 01/02/2008   ALLERGIC RHINITIS 09/12/2007   COLONIC POLYPS 03/16/2007   GERD 03/16/2007   Hyperlipidemia 02/21/2007   Anxiety state 02/21/2007   IBS 02/21/2007   DEGENERATIVE JOINT DISEASE 02/21/2007   Past Medical History:  Diagnosis Date   Allergic rhinitis    Anxiety    Arthritis    in back. Physical Therapy 2016   Blood donor    Cancer ([place])    skin on back   Depression    DJD (degenerative joint disease)    Family history of ischemic heart disease    Gastroparesis    GERD (gastroesophageal reflux disease)    Hx of colonic polyps    Hypercholesteremia    Hyperlipidemia    IBS (irritable bowel syndrome)    Past Surgical History:  Procedure Laterality Date   ABDOMINAL HYSTERECTOMY  1985   TAH/BSO Age 67 DUB, pelvic pain   BREAST EXCISIONAL BIOPSY Left 1980, 1983   Benign Breast Biospy x 2   CYST EXCISION  04/2011   Thigh   left breast biopsy     benign x 2   SKIN CANCER EXCISION     on back   SKIN SURGERY     removal of small skin on face   Social History   Tobacco Use   Smoking status: Never   Smokeless tobacco: Never  Vaping Use   Vaping Use: Never used  Substance Use Topics   Alcohol use: No    Alcohol/week: 0.0 standard drinks   Drug use: No   Family History  Problem Relation Age of Onset   Leukemia Mother    Hyperlipidemia Sister    Other Sister        2 cardiac episodes   Breast cancer Maternal Aunt    Breast cancer Maternal Aunt    Colon cancer Neg Hx    Allergies  Allergen Reactions   Aspirin     Unable to take a full aspirin--causes palpitations Tachycardia   Crab [Shellfish Allergy]     Rash from head to toe   Current Outpatient Medications on File Prior to Visit  Medication Sig Dispense Refill   acetaminophen (TYLENOL) 325 MG tablet Take 650 mg by mouth every 6 (six) hours as needed.  ALPRAZolam (XANAX) 0.5 MG tablet TAKE 1 TABLET BY MOUTH THREE TIMES A DAY AS NEEDED 90 tablet 0   calcium gluconate 500 MG tablet  Take 500 mg by mouth daily.     Casanthranol-Docusate Sodium 30-100 MG CAPS Take 2 capsules by mouth daily.     Cholecalciferol (VITAMIN D) 2000 UNITS CAPS Take 1 capsule by mouth daily.     Ibuprofen (ADVIL PO) Take by mouth as needed.     Multiple Vitamins-Minerals (MULTIPLE VITAMINS/WOMENS PO) Take 1 tablet by mouth daily.     OVER THE COUNTER MEDICATION Fiber gummies     triamcinolone cream (KENALOG) 0.1 % APPLY TO RASK TWICE A DAY AS DIRECTED FOR 3 WEEKS     No current facility-administered medications on file prior to visit.    Review of Systems  Constitutional:  Negative for activity change, appetite change, fatigue, fever and unexpected weight change.  HENT:  Negative for congestion, ear pain, rhinorrhea, sinus pressure and sore throat.   Eyes:  Negative for pain, redness and visual disturbance.  Respiratory:  Negative for cough, shortness of breath and wheezing.   Cardiovascular:  Negative for chest pain and palpitations.  Gastrointestinal:  Negative for abdominal pain, blood in stool, constipation and diarrhea.  Endocrine: Negative for polydipsia and polyuria.  Genitourinary:  Negative for dysuria, frequency and urgency.  Musculoskeletal:  Negative for arthralgias, back pain and myalgias.  Skin:  Negative for pallor and rash.  Allergic/Immunologic: Negative for environmental allergies.  Neurological:  Negative for dizziness, syncope and headaches.  Hematological:  Negative for adenopathy. Does not bruise/bleed easily.  Psychiatric/Behavioral:  Negative for decreased concentration and dysphoric mood. The patient is not nervous/anxious.       Objective:   Physical Exam Constitutional:      General: She is not in acute distress.    Appearance: Normal appearance. She is well-developed. She is not ill-appearing or diaphoretic.  HENT:     Head: Normocephalic and atraumatic.     Right Ear: Tympanic membrane, ear canal and external ear normal.     Left Ear: Tympanic membrane, ear  canal and external ear normal.     Nose: Nose normal. No congestion.     Mouth/Throat:     Mouth: Mucous membranes are moist.     Pharynx: Oropharynx is clear. No posterior oropharyngeal erythema.  Eyes:     General: No scleral icterus.    Extraocular Movements: Extraocular movements intact.     Conjunctiva/sclera: Conjunctivae normal.     Pupils: Pupils are equal, round, and reactive to light.  Neck:     Thyroid: No thyromegaly.     Vascular: No carotid bruit or JVD.  Cardiovascular:     Rate and Rhythm: Normal rate and regular rhythm.     Pulses: Normal pulses.     Heart sounds: Normal heart sounds.    No gallop.  Pulmonary:     Effort: Pulmonary effort is normal. No respiratory distress.     Breath sounds: Normal breath sounds. No wheezing.     Comments: Good air exch Chest:     Chest wall: No tenderness.  Abdominal:     General: Bowel sounds are normal. There is no distension or abdominal bruit.     Palpations: Abdomen is soft. There is no mass.     Tenderness: There is no abdominal tenderness.     Hernia: No hernia is present.  Genitourinary:    Comments: Breast exam: No mass, nodules, thickening, tenderness, bulging, retraction, inflamation,  nipple discharge or skin changes noted.  No axillary or clavicular LA.     Musculoskeletal:        General: No tenderness. Normal range of motion.     Cervical back: Normal range of motion and neck supple. No rigidity. No muscular tenderness.     Right lower leg: No edema.     Left lower leg: No edema.     Comments: No kyphosis   Lymphadenopathy:     Cervical: No cervical adenopathy.  Skin:    General: Skin is warm and dry.     Coloration: Skin is not pale.     Findings: No erythema or rash.     Comments: Solar lentigines diffusely Few angiomas   She sees dermatologist yearly  Neurological:     Mental Status: She is alert. Mental status is at baseline.     Cranial Nerves: No cranial nerve deficit.     Motor: No abnormal  muscle tone.     Coordination: Coordination normal.     Gait: Gait normal.     Deep Tendon Reflexes: Reflexes are normal and symmetric. Reflexes normal.  Psychiatric:        Mood and Affect: Mood normal.        Cognition and Memory: Cognition and memory normal.          Assessment & Plan:   Problem List Items Addressed This Visit       Digestive   GERD    Continues ppi/protonix 40 mg Symptoms are well controlled with this and diet Enc to try and see if she can wean it (aware to expect some acid rebound)  Will try every other day for 2-4 wk and then if doing well try to stop        Relevant Medications   pantoprazole (PROTONIX) 40 MG tablet     Musculoskeletal and Integument   Osteopenia    dexa 10/19  Pt pref to do this with mamm in the fall-she will call to get that order in sept No falls or fx Good exercise habits Taking vit D         Other   Hyperlipidemia    Disc goals for lipids and reasons to control them Rev last labs with pt Rev low sat fat diet in detail Well controlled with atorvastatin 20 mg daily and diet         Relevant Medications   atorvastatin (LIPITOR) 20 MG tablet   Routine general medical examination at a health care facility - Primary    Reviewed health habits including diet and exercise and skin cancer prevention Reviewed appropriate screening tests for age  Also reviewed health mt list, fam hx and immunization status , as well as social and family history   See HPI Labs reviewed  Discussed shingrix vaccine-plans to get in pharmacy if covered covid imm with booster Mammogram utd  Colonoscopy utd  dexa due-pt plans to get in oct with mammogram  No falls or fx and takes vit D

## 2020-07-26 NOTE — Assessment & Plan Note (Signed)
Continues ppi/protonix 40 mg Symptoms are well controlled with this and diet Enc to try and see if she can wean it (aware to expect some acid rebound)  Will try every other day for 2-4 wk and then if doing well try to stop

## 2020-08-07 ENCOUNTER — Ambulatory Visit (INDEPENDENT_AMBULATORY_CARE_PROVIDER_SITE_OTHER): Payer: Medicare HMO

## 2020-08-07 ENCOUNTER — Other Ambulatory Visit: Payer: Self-pay

## 2020-08-07 DIAGNOSIS — Z0000 Encounter for general adult medical examination without abnormal findings: Secondary | ICD-10-CM

## 2020-08-07 NOTE — Patient Instructions (Signed)
Tabitha Carlson , Thank you for taking time to come for your Medicare Wellness Visit. I appreciate your ongoing commitment to your health goals. Please review the following plan we discussed and let me know if I can assist you in the future.   Screening recommendations/referrals: Colonoscopy: Up to date, completed 12/19/2014, due 12/2024 Mammogram: Up to date, completed 11/29/2019, due 11/2020 Bone Density: due, will complete along with mammogram in the Fall  Recommended yearly ophthalmology/optometry visit for glaucoma screening and checkup Recommended yearly dental visit for hygiene and checkup  Vaccinations: Influenza vaccine: Up to date, completed 10/26/2019, due 09/2020 Pneumococcal vaccine: Completed series Tdap vaccine: decline-insurance  Shingles vaccine: due, check with your insurance regarding coverage if interested   Covid-19:completed 3 vaccines  Advanced directives: Advance directive discussed with you today. I have provided a copy for you to complete at home and have notarized. Once this is complete please bring a copy in to our office so we can scan it into your chart.  Conditions/risks identified: hyperlipidemia   Next appointment: Follow up in one year for your annual wellness visit    Preventive Care 65 Years and Older, Female Preventive care refers to lifestyle choices and visits with your health care provider that can promote health and wellness. What does preventive care include? A yearly physical exam. This is also called an annual well check. Dental exams once or twice a year. Routine eye exams. Ask your health care provider how often you should have your eyes checked. Personal lifestyle choices, including: Daily care of your teeth and gums. Regular physical activity. Eating a healthy diet. Avoiding tobacco and drug use. Limiting alcohol use. Practicing safe sex. Taking low-dose aspirin every day. Taking vitamin and mineral supplements as recommended by your health  care provider. What happens during an annual well check? The services and screenings done by your health care provider during your annual well check will depend on your age, overall health, lifestyle risk factors, and family history of disease. Counseling  Your health care provider may ask you questions about your: Alcohol use. Tobacco use. Drug use. Emotional well-being. Home and relationship well-being. Sexual activity. Eating habits. History of falls. Memory and ability to understand (cognition). Work and work Statistician. Reproductive health. Screening  You may have the following tests or measurements: Height, weight, and BMI. Blood pressure. Lipid and cholesterol levels. These may be checked every 5 years, or more frequently if you are over 67 years old. Skin check. Lung cancer screening. You may have this screening every year starting at age 37 if you have a 30-pack-year history of smoking and currently smoke or have quit within the past 15 years. Fecal occult blood test (FOBT) of the stool. You may have this test every year starting at age 69. Flexible sigmoidoscopy or colonoscopy. You may have a sigmoidoscopy every 5 years or a colonoscopy every 10 years starting at age 66. Hepatitis C blood test. Hepatitis B blood test. Sexually transmitted disease (STD) testing. Diabetes screening. This is done by checking your blood sugar (glucose) after you have not eaten for a while (fasting). You may have this done every 1-3 years. Bone density scan. This is done to screen for osteoporosis. You may have this done starting at age 71. Mammogram. This may be done every 1-2 years. Talk to your health care provider about how often you should have regular mammograms. Talk with your health care provider about your test results, treatment options, and if necessary, the need for more tests. Vaccines  Your health care provider may recommend certain vaccines, such as: Influenza vaccine. This is  recommended every year. Tetanus, diphtheria, and acellular pertussis (Tdap, Td) vaccine. You may need a Td booster every 10 years. Zoster vaccine. You may need this after age 22. Pneumococcal 13-valent conjugate (PCV13) vaccine. One dose is recommended after age 93. Pneumococcal polysaccharide (PPSV23) vaccine. One dose is recommended after age 41. Talk to your health care provider about which screenings and vaccines you need and how often you need them. This information is not intended to replace advice given to you by your health care provider. Make sure you discuss any questions you have with your health care provider. Document Released: 02/22/2015 Document Revised: 10/16/2015 Document Reviewed: 11/27/2014 Elsevier Interactive Patient Education  2017 [place] Prevention in the Home Falls can cause injuries. They can happen to people of all ages. There are many things you can do to make your home safe and to help prevent falls. What can I do on the outside of my home? Regularly fix the edges of walkways and driveways and fix any cracks. Remove anything that might make you trip as you walk through a door, such as a raised step or threshold. Trim any bushes or trees on the path to your home. Use bright outdoor lighting. Clear any walking paths of anything that might make someone trip, such as rocks or tools. Regularly check to see if handrails are loose or broken. Make sure that both sides of any steps have handrails. Any raised decks and porches should have guardrails on the edges. Have any leaves, snow, or ice cleared regularly. Use sand or salt on walking paths during winter. Clean up any spills in your garage right away. This includes oil or grease spills. What can I do in the bathroom? Use night lights. Install grab bars by the toilet and in the tub and shower. Do not use towel bars as grab bars. Use non-skid mats or decals in the tub or shower. If you need to sit down in  the shower, use a plastic, non-slip stool. Keep the floor dry. Clean up any water that spills on the floor as soon as it happens. Remove soap buildup in the tub or shower regularly. Attach bath mats securely with double-sided non-slip rug tape. Do not have throw rugs and other things on the floor that can make you trip. What can I do in the bedroom? Use night lights. Make sure that you have a light by your bed that is easy to reach. Do not use any sheets or blankets that are too big for your bed. They should not hang down onto the floor. Have a firm chair that has side arms. You can use this for support while you get dressed. Do not have throw rugs and other things on the floor that can make you trip. What can I do in the kitchen? Clean up any spills right away. Avoid walking on wet floors. Keep items that you use a lot in easy-to-reach places. If you need to reach something above you, use a strong step stool that has a grab bar. Keep electrical cords out of the way. Do not use floor polish or wax that makes floors slippery. If you must use wax, use non-skid floor wax. Do not have throw rugs and other things on the floor that can make you trip. What can I do with my stairs? Do not leave any items on the stairs. Make sure that there are  handrails on both sides of the stairs and use them. Fix handrails that are broken or loose. Make sure that handrails are as long as the stairways. Check any carpeting to make sure that it is firmly attached to the stairs. Fix any carpet that is loose or worn. Avoid having throw rugs at the top or bottom of the stairs. If you do have throw rugs, attach them to the floor with carpet tape. Make sure that you have a light switch at the top of the stairs and the bottom of the stairs. If you do not have them, ask someone to add them for you. What else can I do to help prevent falls? Wear shoes that: Do not have high heels. Have rubber bottoms. Are comfortable  and fit you well. Are closed at the toe. Do not wear sandals. If you use a stepladder: Make sure that it is fully opened. Do not climb a closed stepladder. Make sure that both sides of the stepladder are locked into place. Ask someone to hold it for you, if possible. Clearly mark and make sure that you can see: Any grab bars or handrails. First and last steps. Where the edge of each step is. Use tools that help you move around (mobility aids) if they are needed. These include: Canes. Walkers. Scooters. Crutches. Turn on the lights when you go into a dark area. Replace any light bulbs as soon as they burn out. Set up your furniture so you have a clear path. Avoid moving your furniture around. If any of your floors are uneven, fix them. If there are any pets around you, be aware of where they are. Review your medicines with your doctor. Some medicines can make you feel dizzy. This can increase your chance of falling. Ask your doctor what other things that you can do to help prevent falls. This information is not intended to replace advice given to you by your health care provider. Make sure you discuss any questions you have with your health care provider. Document Released: 11/22/2008 Document Revised: 07/04/2015 Document Reviewed: 03/02/2014 Elsevier Interactive Patient Education  2017 [employer].

## 2020-08-07 NOTE — Progress Notes (Signed)
PCP notes:  Health Maintenance: Shingrix- due Dexa- due   Abnormal Screenings: none   Patient concerns: none   Nurse concerns: none   Next PCP appt.: none

## 2020-08-07 NOTE — Progress Notes (Signed)
Subjective:   Tabitha Carlson is a 75 y.o. female who presents for Medicare Annual (Subsequent) preventive examination.  Review of Systems: N/A      I connected with the patient today by telephone and verified that I am speaking with the correct person using two identifiers. Location patient: home Location nurse: work Persons participating in the telephone visit: patient, nurse.   I discussed the limitations, risks, security and privacy concerns of performing an evaluation and management service by telephone and the availability of in person appointments. I also discussed with the patient that there may be a patient responsible charge related to this service. The patient expressed understanding and verbally consented to this telephonic visit.        Cardiac Risk Factors include: advanced age (>79men, >31 women);Other (see comment), Risk factor comments: hyperlipidemia     Objective:    Today's Vitals   There is no height or weight on file to calculate BMI.  Advanced Directives 08/07/2020 08/12/2019 07/20/2019 07/19/2018 06/29/2017 06/18/2016 06/18/2015  Does Patient Have a Medical Advance Directive? No No No No No No No  Does patient want to make changes to medical advance directive? Yes (MAU/Ambulatory/Procedural Areas - Information given) - - - Yes (MAU/Ambulatory/Procedural Areas - Information given) - -  Would patient like information on creating a medical advance directive? - No - Patient declined No - Patient declined No - Patient declined - - Yes - Educational materials given    Current Medications (verified) Outpatient Encounter Medications as of 08/07/2020  Medication Sig   acetaminophen (TYLENOL) 325 MG tablet Take 650 mg by mouth every 6 (six) hours as needed.   ALPRAZolam (XANAX) 0.5 MG tablet TAKE 1 TABLET BY MOUTH THREE TIMES A DAY AS NEEDED   atorvastatin (LIPITOR) 20 MG tablet Take 1 tablet (20 mg total) by mouth daily.   calcium gluconate 500 MG tablet Take 500 mg by mouth  daily.   Casanthranol-Docusate Sodium 30-100 MG CAPS Take 2 capsules by mouth daily.   Cholecalciferol (VITAMIN D) 2000 UNITS CAPS Take 1 capsule by mouth daily.   Ibuprofen (ADVIL PO) Take by mouth as needed.   Multiple Vitamins-Minerals (MULTIPLE VITAMINS/WOMENS PO) Take 1 tablet by mouth daily.   OVER THE COUNTER MEDICATION Fiber gummies   pantoprazole (PROTONIX) 40 MG tablet Take 1 tablet (40 mg total) by mouth daily.   triamcinolone cream (KENALOG) 0.1 % APPLY TO RASK TWICE A DAY AS DIRECTED FOR 3 WEEKS   No facility-administered encounter medications on file as of 08/07/2020.    Allergies (verified) Aspirin and Crab [shellfish allergy]   History: Past Medical History:  Diagnosis Date   Allergic rhinitis    Anxiety    Arthritis    in back. Physical Therapy 2016   Blood donor    Cancer ([place])    skin on back   Depression    DJD (degenerative joint disease)    Family history of ischemic heart disease    Gastroparesis    GERD (gastroesophageal reflux disease)    Hx of colonic polyps    Hypercholesteremia    Hyperlipidemia    IBS (irritable bowel syndrome)    Past Surgical History:  Procedure Laterality Date   ABDOMINAL HYSTERECTOMY  1985   TAH/BSO Age 58 DUB, pelvic pain   BREAST EXCISIONAL BIOPSY Left 1980, 1983   Benign Breast Biospy x 2   CYST EXCISION  04/2011   Thigh   left breast biopsy     benign x 2  SKIN CANCER EXCISION     on back   SKIN SURGERY     removal of small skin on face   Family History  Problem Relation Age of Onset   Leukemia Mother    Hyperlipidemia Sister    Other Sister        2 cardiac episodes   Breast cancer Maternal Aunt    Breast cancer Maternal Aunt    Colon cancer Neg Hx    Social History   Socioeconomic History   Marital status: Married    Spouse name: Printmaker   Number of children: 2   Years of education: Not on file   Highest education level: Not on file  Occupational History   Occupation: housewife  Tobacco  Use   Smoking status: Never   Smokeless tobacco: Never  Vaping Use   Vaping Use: Never used  Substance and Sexual Activity   Alcohol use: No    Alcohol/week: 0.0 standard drinks   Drug use: No   Sexual activity: Yes    Partners: Male    Birth control/protection: Surgical    Comment: Hysterectomy  Other Topics Concern   Not on file  Social History Narrative   Not on file   Social Determinants of Health   Financial Resource Strain: Low Risk    Difficulty of Paying Living Expenses: Not hard at all  Food Insecurity: No Food Insecurity   Worried About Charity fundraiser in the Last Year: Never true   [place] in the Last Year: Never true  Transportation Needs: No Transportation Needs   Lack of Transportation (Medical): No   Lack of Transportation (Non-Medical): No  Physical Activity: Sufficiently Active   Days of Exercise per Week: 7 days   Minutes of Exercise per Session: 60 min  Stress: Stress Concern Present   Feeling of Stress : To some extent  Social Connections: Not on file    Tobacco Counseling Counseling given: Not Answered   Clinical Intake:  Pre-visit preparation completed: Yes  Pain : No/denies pain     Nutritional Risks: None Diabetes: No  How often do you need to have someone help you when you read instructions, pamphlets, or other written materials from your doctor or pharmacy?: 1 - Never  Diabetic: No Nutrition Risk Assessment:  Has the patient had any N/V/D within the last 2 months?  No  Does the patient have any non-healing wounds?  No  Has the patient had any unintentional weight loss or weight gain?  No   Diabetes:  Is the patient diabetic?  No  If diabetic, was a CBG obtained today?   N/A Did the patient bring in their glucometer from home?   N/A How often do you monitor your CBG's? N/A.   Financial Strains and Diabetes Management:  Are you having any financial strains with the device, your supplies or your medication?  N/A .   Does the patient want to be seen by Chronic Care Management for management of their diabetes?   N/A Would the patient like to be referred to a Nutritionist or for Diabetic Management?   N/A  Interpreter Needed?: No  Information entered by :: CJohnson, LPN   Activities of Daily Living In your present state of health, do you have any difficulty performing the following activities: 08/07/2020 07/26/2020  Hearing? N N  Vision? N N  Difficulty concentrating or making decisions? N N  Walking or climbing stairs? N N  Dressing or bathing?  N N  Doing errands, shopping? N N  Preparing Food and eating ? N -  Using the Toilet? N -  In the past six months, have you accidently leaked urine? N -  Do you have problems with loss of bowel control? N -  Managing your Medications? N -  Managing your Finances? N -  Housekeeping or managing your Housekeeping? N -  Some recent data might be hidden    Patient Care Team: Tower, Wynelle Fanny, MD as PCP - General (Family Medicine) Ralene Bathe, MD as Consulting Physician (Ophthalmology) Regina Eck, CNM as Referring Physician (Certified Nurse Midwife)  Indicate any recent Medical Services you may have received from other than [facility] providers in the past year (date may be approximate).     Assessment:   This is a routine wellness examination for [place].  Hearing/Vision screen Vision Screening - Comments:: Patient gets annual eye exams   Dietary issues and exercise activities discussed: Current Exercise Habits: Home exercise routine, Type of exercise: strength training/weights;Other - see comments (step aerobics), Time (Minutes): 60, Frequency (Times/Week): 7, Weekly Exercise (Minutes/Week): 420, Intensity: Moderate, Exercise limited by: None identified   Goals Addressed             This Visit's Progress    Patient Stated       08/07/2020, I will continue to do weights and step aerobics daily for 1 hour.        Depression Screen PHQ  2/9 Scores 08/07/2020 07/26/2020 07/20/2019 07/19/2018 06/29/2017 06/18/2016 06/18/2015  PHQ - 2 Score 0 0 0 0 0 0 0  PHQ- 9 Score 0 0 0 0 0 - -    Fall Risk Fall Risk  08/07/2020 07/26/2020 07/20/2019 07/19/2018 06/29/2017  Falls in the past year? 0 0 0 0 No  Comment - - - - -  Number falls in past yr: 0 0 0 - -  Injury with Fall? 0 0 0 - -  Risk Factor Category  - - - - -  Risk for fall due to : Medication side effect - No Fall Risks - -  Follow up Falls evaluation completed;Falls prevention discussed - Falls evaluation completed;Falls prevention discussed - -  Comment - - - - -    FALL RISK PREVENTION PERTAINING TO THE HOME:  Any stairs in or around the home? Yes  If so, are there any without handrails? No  Home free of loose throw rugs in walkways, pet beds, electrical cords, etc? Yes  Adequate lighting in your home to reduce risk of falls? Yes   ASSISTIVE DEVICES UTILIZED TO PREVENT FALLS:  Life alert? No  Use of a cane, walker or w/c? No  Grab bars in the bathroom? No  Shower chair or bench in shower? No  Elevated toilet seat or a handicapped toilet? No   TIMED UP AND GO:  Was the test performed?  N/A telephone visit .   Cognitive Function: MMSE - Mini Mental State Exam 08/07/2020 07/20/2019 07/19/2018 06/29/2017 06/18/2016  Orientation to time 5 5 5 5 5   Orientation to Place 5 5 5 5 5   Registration 3 3 3 3 3   Attention/ Calculation 5 5 0 0 0  Recall 3 3 3 3 3   Language- name 2 objects - - 0 0 0  Language- repeat 1 1 1 1 1   Language- follow 3 step command - - 0 3 3  Language- read & follow direction - - 0 0 0  Write a sentence - -  0 0 0  Copy design - - 0 0 0  Total score - - 17 20 20   Mini Cog  Mini-Cog screen was completed. Maximum score is 22. A value of 0 denotes this part of the MMSE was not completed or the patient failed this part of the Mini-Cog screening.       Immunizations Immunization History  Administered Date(s) Administered   Fluad Quad(high Dose 65+)  10/13/2018   Influenza Split 11/01/2010, 10/23/2011   Influenza Whole 10/10/2009   Influenza, High Dose Seasonal PF 10/23/2014, 11/11/2016, 10/27/2017, 10/26/2019   Influenza, Seasonal, Injecte, Preservative Fre 11/14/2015   Influenza,inj,Quad PF,6+ Mos 10/26/2012, 10/20/2013   PFIZER(Purple Top)SARS-COV-2 Vaccination 03/26/2019, 04/18/2019, 12/07/2019   Pneumococcal Conjugate-13 06/04/2015   Pneumococcal Polysaccharide-23 10/20/2013   Td 03/04/2000, 03/20/2010   Zoster, Live 12/11/2014    TDAP status: Due, Education has been provided regarding the importance of this vaccine. Advised may receive this vaccine at local pharmacy or Health Dept. Aware to provide a copy of the vaccination record if obtained from local pharmacy or Health Dept. Verbalized acceptance and understanding.  Flu Vaccine status: Up to date  Pneumococcal vaccine status: Up to date  Covid-19 vaccine status: Completed 3 vaccines  Qualifies for Shingles Vaccine? Yes   Zostavax completed Yes   Shingrix Completed?: No.    Education has been provided regarding the importance of this vaccine. Patient has been advised to call insurance company to determine out of pocket expense if they have not yet received this vaccine. Advised may also receive vaccine at local pharmacy or Health Dept. Verbalized acceptance and understanding.  Screening Tests Health Maintenance  Topic Date Due   Zoster Vaccines- Shingrix (1 of 2) Never done   COVID-19 Vaccine (4 - Booster for Pfizer series) 04/08/2020   TETANUS/TDAP  07/27/2030 (Originally 03/20/2020)   INFLUENZA VACCINE  09/09/2020   MAMMOGRAM  11/28/2020   COLONOSCOPY (Pts 45-33yrs Insurance coverage will need to be confirmed)  12/18/2024   DEXA SCAN  Completed   Hepatitis C Screening  Completed   PNA vac Low Risk Adult  Completed   HPV VACCINES  Aged Out    Health Maintenance  Health Maintenance Due  Topic Date Due   Zoster Vaccines- Shingrix (1 of 2) Never done   COVID-19  Vaccine (4 - Booster for Pfizer series) 04/08/2020    Colorectal cancer screening: Type of screening: Colonoscopy. Completed 12/19/2014. Repeat every 10 years  Mammogram status: Completed 11/29/2019. Repeat every year  Bone Density status: due, will complete in the Fall along with mammogram  Lung Cancer Screening: (Low Dose CT Chest recommended if Age 47-80 years, 30 pack-year currently smoking OR have quit w/in 15 years.) does not qualify.  Additional Screening:  Hepatitis C Screening: does qualify; Completed 06/18/2015  Vision Screening: Recommended annual ophthalmology exams for early detection of glaucoma and other disorders of the eye. Is the patient up to date with their annual eye exam?  Yes  Who is the provider or what is the name of the office in which the patient attends annual eye exams? [facility] If pt is not established with a provider, would they like to be referred to a provider to establish care? No .   Dental Screening: Recommended annual dental exams for proper oral hygiene  Community Resource Referral / Chronic Care Management: CRR required this visit?  No   CCM required this visit?  No      Plan:     I have personally reviewed and  noted the following in the patient's chart:   Medical and social history Use of alcohol, tobacco or illicit drugs  Current medications and supplements including opioid prescriptions.  Functional ability and status Nutritional status Physical activity Advanced directives List of other physicians Hospitalizations, surgeries, and ER visits in previous 12 months Vitals Screenings to include cognitive, depression, and falls Referrals and appointments  In addition, I have reviewed and discussed with patient certain preventive protocols, quality metrics, and best practice recommendations. A written personalized care plan for preventive services as well as general preventive health recommendations were provided to  patient.   Due to this being a telephonic visit, the after visit summary with patients personalized plan was offered to patient via office or my-chart. Patient preferred to pick up at office at next visit or via mychart.   Andrez Grime, LPN   [id number]

## 2020-10-15 ENCOUNTER — Other Ambulatory Visit: Payer: Self-pay | Admitting: Family Medicine

## 2020-10-18 NOTE — Telephone Encounter (Signed)
Name of Medication: Xanax Name of Pharmacy: [facility] or Written Date and Quantity: 07/10/20 #90 tabs 0 refills Last Office Visit and Type: CPE 07/26/20 Next Office Visit and Type: med refill 10/22/20

## 2020-10-22 ENCOUNTER — Ambulatory Visit (INDEPENDENT_AMBULATORY_CARE_PROVIDER_SITE_OTHER): Payer: Medicare HMO | Admitting: Family Medicine

## 2020-10-22 ENCOUNTER — Encounter: Payer: Self-pay | Admitting: Family Medicine

## 2020-10-22 ENCOUNTER — Other Ambulatory Visit: Payer: Self-pay

## 2020-10-22 VITALS — BP 131/80 | HR 91 | Temp 98.2°F | Ht 64.75 in | Wt 154.0 lb

## 2020-10-22 DIAGNOSIS — F411 Generalized anxiety disorder: Secondary | ICD-10-CM | POA: Diagnosis not present

## 2020-10-22 DIAGNOSIS — R030 Elevated blood-pressure reading, without diagnosis of hypertension: Secondary | ICD-10-CM | POA: Diagnosis not present

## 2020-10-22 DIAGNOSIS — R69 Illness, unspecified: Secondary | ICD-10-CM | POA: Diagnosis not present

## 2020-10-22 MED ORDER — BUSPIRONE HCL 15 MG PO TABS
7.5000 mg | ORAL_TABLET | Freq: Two times a day (BID) | ORAL | 11 refills | Status: DC
Start: 2020-10-22 — End: 2020-11-13

## 2020-10-22 NOTE — Assessment & Plan Note (Signed)
Improved bp on 2nd check BP: 131/80

## 2020-10-22 NOTE — Progress Notes (Signed)
Subjective:    Patient ID: Tabitha Carlson, female    DOB: Jul 22, 1945, 75 y.o.   MRN: [id number]  This visit occurred during the SARS-CoV-2 public health emergency.  Safety protocols were in place, including screening questions prior to the visit, additional usage of staff PPE, and extensive cleaning of exam room while observing appropriate contact time as indicated for disinfecting solutions.   HPI Pt presents for f/u of medications    Wt Readings from Last 3 Encounters:  10/22/20 154 lb (69.9 kg)  07/26/20 153 lb (69.4 kg)  08/12/19 149 lb (67.6 kg)   25.83 kg/m  Takes xanax for anxiety  Tid prn Filled px for 90 on 9/10   Anxiety is still a problem Just a part of her life  This keeps it under control   Has a lot of tragedy in family  Sister has tried to commit suicide a number of times  She stays with them frequently  Other things also   Anxiety disorder runs in the family  [place] daughters   She takes one daily average    She tries to take care of herself   Has done some counseling with church in the past  New church -not comfortable doing this yet   She used to practice yoga -it helped a lot  Still has a mat and a tape  [facility]    Tends to last about 3 months    BP Readings from Last 3 Encounters:  10/22/20 131/80  07/26/20 (!) 144/86  08/12/19 (!) 164/73    Pulse Readings from Last 3 Encounters:  10/22/20 91  07/26/20 75  08/12/19 87   Patient Active Problem List   Diagnosis Date Noted   Elevated blood pressure reading 03/22/2019   Medicare annual wellness visit, subsequent 06/29/2017   Estrogen deficiency 06/04/2015   Routine general medical examination at a health care facility 05/18/2015   Encounter for Medicare annual wellness exam 05/18/2014   Osteopenia 10/20/2013   Gastroparesis 01/02/2008   ALLERGIC RHINITIS 09/12/2007   COLONIC POLYPS 03/16/2007   GERD 03/16/2007   Hyperlipidemia 02/21/2007   GAD (generalized  anxiety disorder) 02/21/2007   IBS 02/21/2007   DEGENERATIVE JOINT DISEASE 02/21/2007   Past Medical History:  Diagnosis Date   Allergic rhinitis    Anxiety    Arthritis    in back. Physical Therapy 2016   Blood donor    Cancer ([place])    skin on back   Depression    DJD (degenerative joint disease)    Family history of ischemic heart disease    Gastroparesis    GERD (gastroesophageal reflux disease)    Hx of colonic polyps    Hypercholesteremia    Hyperlipidemia    IBS (irritable bowel syndrome)    Past Surgical History:  Procedure Laterality Date   ABDOMINAL HYSTERECTOMY  1985   TAH/BSO Age 86 DUB, pelvic pain   BREAST EXCISIONAL BIOPSY Left 1980, 1983   Benign Breast Biospy x 2   CYST EXCISION  04/2011   Thigh   left breast biopsy     benign x 2   SKIN CANCER EXCISION     on back   SKIN SURGERY     removal of small skin on face   Social History   Tobacco Use   Smoking status: Never   Smokeless tobacco: Never  Vaping Use   Vaping Use: Never used  Substance Use Topics  Alcohol use: No    Alcohol/week: 0.0 standard drinks   Drug use: No   Family History  Problem Relation Age of Onset   Leukemia Mother    Hyperlipidemia Sister    Other Sister        2 cardiac episodes   Breast cancer Maternal Aunt    Breast cancer Maternal Aunt    Colon cancer Neg Hx    Allergies  Allergen Reactions   Aspirin     Unable to take a full aspirin--causes palpitations Tachycardia   Crab [Shellfish Allergy]     Rash from head to toe   Current Outpatient Medications on File Prior to Visit  Medication Sig Dispense Refill   acetaminophen (TYLENOL) 325 MG tablet Take 650 mg by mouth every 6 (six) hours as needed.     ALPRAZolam (XANAX) 0.5 MG tablet TAKE 1 TABLET BY MOUTH THREE TIMES A DAY AS NEEDED 90 tablet 0   atorvastatin (LIPITOR) 20 MG tablet Take 1 tablet (20 mg total) by mouth daily. 90 tablet 3   calcium gluconate 500 MG tablet Take 500 mg by mouth daily.      Casanthranol-Docusate Sodium 30-100 MG CAPS Take 2 capsules by mouth daily.     Cholecalciferol (VITAMIN D) 2000 UNITS CAPS Take 1 capsule by mouth daily.     Ibuprofen (ADVIL PO) Take by mouth as needed.     Multiple Vitamins-Minerals (MULTIPLE VITAMINS/WOMENS PO) Take 1 tablet by mouth daily.     OVER THE COUNTER MEDICATION Fiber gummies     triamcinolone cream (KENALOG) 0.1 % APPLY TO RASK TWICE A DAY AS DIRECTED FOR 3 WEEKS     No current facility-administered medications on file prior to visit.     Review of Systems  Constitutional:  Negative for activity change, appetite change, fatigue, fever and unexpected weight change.  HENT:  Negative for congestion, ear pain, rhinorrhea, sinus pressure and sore throat.   Eyes:  Negative for pain, redness and visual disturbance.  Respiratory:  Negative for cough, shortness of breath and wheezing.   Cardiovascular:  Negative for chest pain and palpitations.  Gastrointestinal:  Negative for abdominal pain, blood in stool, constipation and diarrhea.  Endocrine: Negative for polydipsia and polyuria.  Genitourinary:  Negative for dysuria, frequency and urgency.  Musculoskeletal:  Negative for arthralgias, back pain and myalgias.  Skin:  Negative for pallor and rash.  Allergic/Immunologic: Negative for environmental allergies.  Neurological:  Negative for dizziness, syncope and headaches.  Hematological:  Negative for adenopathy. Does not bruise/bleed easily.  Psychiatric/Behavioral:  Negative for decreased concentration, dysphoric mood, self-injury, sleep disturbance and suicidal ideas. The patient is nervous/anxious.       Objective:   Physical Exam Constitutional:      Appearance: Normal appearance. She is normal weight.  HENT:     Head: Normocephalic and atraumatic.  Eyes:     Conjunctiva/sclera: Conjunctivae normal.     Pupils: Pupils are equal, round, and reactive to light.  Cardiovascular:     Rate and Rhythm: Normal rate and regular  rhythm.     Heart sounds: Normal heart sounds.  Pulmonary:     Effort: Pulmonary effort is normal. No respiratory distress.  Skin:    Coloration: Skin is not pale.  Neurological:     Mental Status: She is alert.     Coordination: Coordination normal.     Deep Tendon Reflexes: Reflexes normal.     Comments: No tremor   Psychiatric:  Attention and Perception: Attention normal.        Mood and Affect: Mood is anxious.        Speech: Speech normal.        Behavior: Behavior normal.        Thought Content: Thought content normal.        Cognition and Memory: Cognition and memory normal.     Comments: Speaks candidly about symptoms and stressors           Assessment & Plan:   Problem List Items Addressed This Visit       Other   GAD (generalized anxiety disorder) - Primary    Long h/o anxiety in setting of stressors  Sister with bipolar dz and other fam members with anx and depression She does not feel depressed/ just anx  Disc options for tx / would rather get her off xanax in the future in light of age (disc fall and cognitive risks as well as habit forming potential) Reviewed stressors/ coping techniques/symptoms/ support sources/ tx options and side effects in detail today Good self care Recommended return to yoga and meditation Handouts given  Px buspar 7.5 mg bid  Discussed expectations of this medication including time to effectiveness and mechanism of action, also poss of side effects (early and late)- including mental fuzziness, weight or appetite change, nausea and poss of worse dep or anxiety (even suicidal thoughts)  Pt voiced understanding and will stop med and update if this occurs   Will plan to update  Plan to wean xanax once feeling better  She will start new med after return from upcoming trip       Relevant Medications   busPIRone (BUSPAR) 15 MG tablet   Elevated blood pressure reading    Improved bp on 2nd check BP: 131/80

## 2020-10-22 NOTE — Patient Instructions (Addendum)
Think about meditation for anxiety (apps like Calm and Headspace)  Get back to yoga if you can (chair yoga is fine as well)  At home or in a class  Getting outdoors also when you can  Keep taking good care of yourself   I would like you to start some counseling/therapy in the future  Church or other is fine   Try buspar 1/2 pill twice daily (when you get back in town)  Goal is to not need xanax eventually  When feeling better- go ahead and stop it   If any intolerable side effects or if you feel worse -stop it and let us know    Stay active/ mind and body

## 2020-10-22 NOTE — Assessment & Plan Note (Signed)
Long h/o anxiety in setting of stressors  Sister with bipolar dz and other fam members with anx and depression She does not feel depressed/ just anx  Disc options for tx / would rather get her off xanax in the future in light of age (disc fall and cognitive risks as well as habit forming potential) Reviewed stressors/ coping techniques/symptoms/ support sources/ tx options and side effects in detail today Good self care Recommended return to yoga and meditation Handouts given  Px buspar 7.5 mg bid  Discussed expectations of this medication including time to effectiveness and mechanism of action, also poss of side effects (early and late)- including mental fuzziness, weight or appetite change, nausea and poss of worse dep or anxiety (even suicidal thoughts)  Pt voiced understanding and will stop med and update if this occurs   Will plan to update  Plan to wean xanax once feeling better  She will start new med after return from upcoming trip

## 2020-11-04 DIAGNOSIS — L821 Other seborrheic keratosis: Secondary | ICD-10-CM | POA: Diagnosis not present

## 2020-11-04 DIAGNOSIS — D225 Melanocytic nevi of trunk: Secondary | ICD-10-CM | POA: Diagnosis not present

## 2020-11-04 DIAGNOSIS — Z85828 Personal history of other malignant neoplasm of skin: Secondary | ICD-10-CM | POA: Diagnosis not present

## 2020-11-04 DIAGNOSIS — L570 Actinic keratosis: Secondary | ICD-10-CM | POA: Diagnosis not present

## 2020-11-04 DIAGNOSIS — D2261 Melanocytic nevi of right upper limb, including shoulder: Secondary | ICD-10-CM | POA: Diagnosis not present

## 2020-11-13 ENCOUNTER — Telehealth (INDEPENDENT_AMBULATORY_CARE_PROVIDER_SITE_OTHER): Payer: Medicare HMO | Admitting: Family Medicine

## 2020-11-13 ENCOUNTER — Encounter: Payer: Self-pay | Admitting: Family Medicine

## 2020-11-13 ENCOUNTER — Other Ambulatory Visit: Payer: Self-pay | Admitting: Family Medicine

## 2020-11-13 ENCOUNTER — Other Ambulatory Visit: Payer: Self-pay

## 2020-11-13 DIAGNOSIS — J029 Acute pharyngitis, unspecified: Secondary | ICD-10-CM | POA: Insufficient documentation

## 2020-11-13 NOTE — Assessment & Plan Note (Addendum)
With ST and fever and nasal congestion since Friday First home covid test neg but one today was positive   Adv rest/fluids and symptom care  Tylenol/ibuprofen prn fever/pain/st Continue salt water gargle Update if not starting to improve in a week or if worsening    Disc tx symptoms  Isolate until resolution of symptoms  ER parameters discussed (severe symptom, unable to swallow or sob)

## 2020-11-13 NOTE — Progress Notes (Signed)
Virtual Visit via Video Note  I connected with Tabitha Carlson on 11/13/20 at 12:00 PM EDT by a video enabled telemedicine application and verified that I am speaking with the correct person using two identifiers.  Location: Patient: home Provider: office   I discussed the limitations of evaluation and management by telemedicine and the availability of in person appointments. The patient expressed understanding and agreed to proceed.  Parties involved in encounter  Patient: Tabitha Carlson  Provider:  Loura Pardon MD   History of Present Illness: Pt presents for c/o ST with a negative home covid test    Started Friday evening  Has had ST since then  Temp of 101 last night   Stuffy nose -that started yesterday  A little cough possibly from drainage No chest congestion   No rash  A little headache today behind eyes  Sinus pressure  Ears are fine   ST is severe  Hurts to swallow  Has not looked at it  Lymph nodes may be a bit swollen  Little nausea after eating yesterday No vomiting or diarrhea    All mucous is clear   No sick exposures    Covid immunized-has not had new booster yet  Just had flu shot   Did a home covid test on Sunday   Husband feels fine   OTC: tylenol and advil  Some gargling with warm salt water   2nd home covid test today just came back positive  Patient Active Problem List   Diagnosis Date Noted   Sore throat 11/13/2020   Elevated blood pressure reading 03/22/2019   Medicare annual wellness visit, subsequent 06/29/2017   Estrogen deficiency 06/04/2015   Routine general medical examination at a health care facility 05/18/2015   Encounter for Medicare annual wellness exam 05/18/2014   Osteopenia 10/20/2013   Gastroparesis 01/02/2008   ALLERGIC RHINITIS 09/12/2007   COLONIC POLYPS 03/16/2007   GERD 03/16/2007   Hyperlipidemia 02/21/2007   GAD (generalized anxiety disorder) 02/21/2007   IBS 02/21/2007   DEGENERATIVE JOINT DISEASE  02/21/2007   Past Medical History:  Diagnosis Date   Allergic rhinitis    Anxiety    Arthritis    in back. Physical Therapy 2016   Blood donor    Cancer ([place])    skin on back   Depression    DJD (degenerative joint disease)    Family history of ischemic heart disease    Gastroparesis    GERD (gastroesophageal reflux disease)    Hx of colonic polyps    Hypercholesteremia    Hyperlipidemia    IBS (irritable bowel syndrome)    Past Surgical History:  Procedure Laterality Date   ABDOMINAL HYSTERECTOMY  1985   TAH/BSO Age 77 DUB, pelvic pain   BREAST EXCISIONAL BIOPSY Left 1980, 1983   Benign Breast Biospy x 2   CYST EXCISION  04/2011   Thigh   left breast biopsy     benign x 2   SKIN CANCER EXCISION     on back   SKIN SURGERY     removal of small skin on face   Social History   Tobacco Use   Smoking status: Never   Smokeless tobacco: Never  Vaping Use   Vaping Use: Never used  Substance Use Topics   Alcohol use: No    Alcohol/week: 0.0 standard drinks   Drug use: No   Family History  Problem Relation Age of Onset   Leukemia Mother    Hyperlipidemia Sister  Other Sister        2 cardiac episodes   Breast cancer Maternal Aunt    Breast cancer Maternal Aunt    Colon cancer Neg Hx    Allergies  Allergen Reactions   Aspirin     Unable to take a full aspirin--causes palpitations Tachycardia   Crab [Shellfish Allergy]     Rash from head to toe   Current Outpatient Medications on File Prior to Visit  Medication Sig Dispense Refill   acetaminophen (TYLENOL) 325 MG tablet Take 650 mg by mouth every 6 (six) hours as needed.     ALPRAZolam (XANAX) 0.5 MG tablet TAKE 1 TABLET BY MOUTH THREE TIMES A DAY AS NEEDED 90 tablet 0   atorvastatin (LIPITOR) 20 MG tablet Take 1 tablet (20 mg total) by mouth daily. 90 tablet 3   busPIRone (BUSPAR) 15 MG tablet Take 0.5 tablets (7.5 mg total) by mouth 2 (two) times daily. 30 tablet 11   calcium gluconate 500 MG tablet Take  500 mg by mouth daily.     Casanthranol-Docusate Sodium 30-100 MG CAPS Take 2 capsules by mouth daily.     Cholecalciferol (VITAMIN D) 2000 UNITS CAPS Take 1 capsule by mouth daily.     Ibuprofen (ADVIL PO) Take by mouth as needed.     Multiple Vitamins-Minerals (MULTIPLE VITAMINS/WOMENS PO) Take 1 tablet by mouth daily.     OVER THE COUNTER MEDICATION Fiber gummies     triamcinolone cream (KENALOG) 0.1 % APPLY TO RASK TWICE A DAY AS DIRECTED FOR 3 WEEKS     No current facility-administered medications on file prior to visit.   Review of Systems  Constitutional:  Positive for fever and malaise/fatigue. Negative for chills.  HENT:  Positive for congestion and sore throat. Negative for ear pain and sinus pain.   Eyes:  Negative for blurred vision, discharge and redness.  Respiratory:  Positive for cough. Negative for sputum production, shortness of breath, wheezing and stridor.   Cardiovascular:  Negative for chest pain, palpitations and leg swelling.  Gastrointestinal:  Positive for nausea. Negative for abdominal pain, diarrhea and vomiting.  Musculoskeletal:  Negative for myalgias.  Skin:  Negative for rash.  Neurological:  Positive for headaches. Negative for dizziness.   Observations/Objective: Patient appears well, in no distress Weight is baseline  No facial swelling or asymmetry Voice is mildly hoarse No obvious tremor or mobility impairment Moving neck and UEs normally Able to hear the call well  No cough or shortness of breath during interview  Talkative and mentally sharp with no cognitive changes No skin changes on face or neck , no rash or pallor Affect is normal    Assessment and Plan: Problem List Items Addressed This Visit       Other   Sore throat    With ST and fever and nasal congestion since Friday First home covid test neg but one today was positive   Adv rest/fluids and symptom care  Tylenol/ibuprofen prn fever/pain/st Continue salt water gargle Update  if not starting to improve in a week or if worsening    Disc tx symptoms  Isolate until resolution of symptoms  ER parameters discussed (severe symptom, unable to swallow or sob)          Follow Up Instructions: Rest and drink fluids  Continue tylenol and /or ibuprofen for fever and pain and sore throat Continue salt water gargle An antihistamine like zyrtec otc may help runny nose if needed   If  symptoms become severe or you have trouble swallowing or breathing please alert [place] and go to the ER    I discussed the assessment and treatment plan with the patient. The patient was provided an opportunity to ask questions and all were answered. The patient agreed with the plan and demonstrated an understanding of the instructions.   The patient was advised to call back or seek an in-person evaluation if the symptoms worsen or if the condition fails to improve as anticipated.     Loura Pardon, MD

## 2020-11-13 NOTE — Patient Instructions (Signed)
Rest and drink fluids  Continue tylenol and /or ibuprofen for fever and pain and sore throat Continue salt water gargle An antihistamine like zyrtec otc may help runny nose if needed   If symptoms become severe or you have trouble swallowing or breathing please alert [place] and go to the ER

## 2020-12-02 DIAGNOSIS — H5213 Myopia, bilateral: Secondary | ICD-10-CM | POA: Diagnosis not present

## 2020-12-02 DIAGNOSIS — H2513 Age-related nuclear cataract, bilateral: Secondary | ICD-10-CM | POA: Diagnosis not present

## 2020-12-02 DIAGNOSIS — Z0100 Encounter for examination of eyes and vision without abnormal findings: Secondary | ICD-10-CM | POA: Diagnosis not present

## 2021-01-07 ENCOUNTER — Other Ambulatory Visit: Payer: Self-pay | Admitting: Family Medicine

## 2021-01-07 DIAGNOSIS — Z1231 Encounter for screening mammogram for malignant neoplasm of breast: Secondary | ICD-10-CM

## 2021-01-08 ENCOUNTER — Ambulatory Visit
Admission: RE | Admit: 2021-01-08 | Discharge: 2021-01-08 | Disposition: A | Discharge status: Home / Self Care | Payer: Medicare HMO | Source: Ambulatory Visit | Attending: Family Medicine | Admitting: Family Medicine

## 2021-01-08 DIAGNOSIS — Z1231 Encounter for screening mammogram for malignant neoplasm of breast: Secondary | ICD-10-CM | POA: Diagnosis not present

## 2021-03-03 DIAGNOSIS — Z0100 Encounter for examination of eyes and vision without abnormal findings: Secondary | ICD-10-CM | POA: Diagnosis not present

## 2021-03-24 DIAGNOSIS — Z0100 Encounter for examination of eyes and vision without abnormal findings: Secondary | ICD-10-CM | POA: Diagnosis not present

## 2021-06-03 DIAGNOSIS — Z0100 Encounter for examination of eyes and vision without abnormal findings: Secondary | ICD-10-CM | POA: Diagnosis not present

## 2021-07-20 ENCOUNTER — Telehealth: Payer: Self-pay | Admitting: Family Medicine

## 2021-07-20 DIAGNOSIS — K219 Gastro-esophageal reflux disease without esophagitis: Secondary | ICD-10-CM

## 2021-07-20 DIAGNOSIS — R030 Elevated blood-pressure reading, without diagnosis of hypertension: Secondary | ICD-10-CM

## 2021-07-20 DIAGNOSIS — K3184 Gastroparesis: Secondary | ICD-10-CM

## 2021-07-20 DIAGNOSIS — E7800 Pure hypercholesterolemia, unspecified: Secondary | ICD-10-CM

## 2021-07-20 NOTE — Telephone Encounter (Signed)
-----   Message from Ellamae Sia sent at 07/15/2021  3:59 PM EDT ----- Regarding: lab orders for Monday 6.12.23 Patient is scheduled for CPX labs, please order future labs, Thanks , Karna Christmas

## 2021-07-21 ENCOUNTER — Other Ambulatory Visit: Payer: Medicare HMO

## 2021-07-22 ENCOUNTER — Other Ambulatory Visit (INDEPENDENT_AMBULATORY_CARE_PROVIDER_SITE_OTHER): Payer: Medicare HMO

## 2021-07-22 DIAGNOSIS — R030 Elevated blood-pressure reading, without diagnosis of hypertension: Secondary | ICD-10-CM | POA: Diagnosis not present

## 2021-07-22 DIAGNOSIS — E7800 Pure hypercholesterolemia, unspecified: Secondary | ICD-10-CM

## 2021-07-22 DIAGNOSIS — K3184 Gastroparesis: Secondary | ICD-10-CM | POA: Diagnosis not present

## 2021-07-22 DIAGNOSIS — K219 Gastro-esophageal reflux disease without esophagitis: Secondary | ICD-10-CM | POA: Diagnosis not present

## 2021-07-22 LAB — COMPREHENSIVE METABOLIC PANEL
ALT: 17 U/L (ref 0–35)
AST: 18 U/L (ref 0–37)
Albumin: 4.3 g/dL (ref 3.5–5.2)
Alkaline Phosphatase: 67 U/L (ref 39–117)
BUN: 21 mg/dL (ref 6–23)
CO2: 29 mEq/L (ref 19–32)
Calcium: 9.9 mg/dL (ref 8.4–10.5)
Chloride: 102 mEq/L (ref 96–112)
Creatinine, Ser: 0.97 mg/dL (ref 0.40–1.20)
GFR: 57.56 mL/min — ABNORMAL LOW (ref >60.00)
Glucose, Bld: 101 mg/dL — ABNORMAL HIGH (ref 70–99)
Potassium: 4.4 mEq/L (ref 3.5–5.1)
Sodium: 138 mEq/L (ref 135–145)
Total Bilirubin: 0.5 mg/dL (ref 0.2–1.2)
Total Protein: 6.7 g/dL (ref 6.0–8.3)

## 2021-07-22 LAB — CBC WITH DIFFERENTIAL/PLATELET
Basophils Absolute: 0 10*3/uL (ref 0.0–0.1)
Basophils Relative: 0.6 % (ref 0.0–3.0)
Eosinophils Absolute: 0.3 10*3/uL (ref 0.0–0.7)
Eosinophils Relative: 5.5 % — ABNORMAL HIGH (ref 0.0–5.0)
HCT: 41.3 % (ref 36.0–46.0)
Hemoglobin: 13.5 g/dL (ref 12.0–15.0)
Lymphocytes Relative: 44 % (ref 12.0–46.0)
Lymphs Abs: 2.1 10*3/uL (ref 0.7–4.0)
MCHC: 32.8 g/dL (ref 30.0–36.0)
MCV: 92.1 fl (ref 78.0–100.0)
Monocytes Absolute: 0.4 10*3/uL (ref 0.1–1.0)
Monocytes Relative: 8.4 % (ref 3.0–12.0)
Neutro Abs: 2 10*3/uL (ref 1.4–7.7)
Neutrophils Relative %: 41.5 % — ABNORMAL LOW (ref 43.0–77.0)
Platelets: 185 10*3/uL (ref 150.0–400.0)
RBC: 4.48 Mil/uL (ref 3.87–5.11)
RDW: 13.1 % (ref 11.5–15.5)
WBC: 4.7 10*3/uL (ref 4.0–10.5)

## 2021-07-22 LAB — LIPID PANEL
Cholesterol: 159 mg/dL (ref 0–200)
HDL: 55.2 mg/dL (ref >39.00)
LDL Cholesterol: 80 mg/dL (ref 0–99)
NonHDL: 103.35
Total CHOL/HDL Ratio: 3
Triglycerides: 115 mg/dL (ref 0.0–149.0)
VLDL: 23 mg/dL (ref 0.0–40.0)

## 2021-07-22 LAB — TSH: TSH: 2.34 u[IU]/mL (ref 0.35–5.50)

## 2021-07-28 ENCOUNTER — Telehealth: Payer: Self-pay | Admitting: Family Medicine

## 2021-07-28 NOTE — Telephone Encounter (Signed)
-----   Message from Ellamae Sia sent at 07/14/2021 12:24 PM EDT ----- Regarding: Lab orders for Tuesday, 6.20.23 Patient is scheduled for CPX labs, please order future labs, Thanks , Karna Christmas

## 2021-07-29 ENCOUNTER — Other Ambulatory Visit: Payer: Medicare HMO

## 2021-07-31 ENCOUNTER — Ambulatory Visit (INDEPENDENT_AMBULATORY_CARE_PROVIDER_SITE_OTHER): Payer: Medicare HMO | Admitting: Family Medicine

## 2021-07-31 ENCOUNTER — Encounter: Payer: Self-pay | Admitting: Family Medicine

## 2021-07-31 VITALS — BP 126/80 | HR 80 | Ht 64.0 in | Wt 157.0 lb

## 2021-07-31 DIAGNOSIS — E2839 Other primary ovarian failure: Secondary | ICD-10-CM

## 2021-07-31 DIAGNOSIS — M85859 Other specified disorders of bone density and structure, unspecified thigh: Secondary | ICD-10-CM

## 2021-07-31 DIAGNOSIS — R69 Illness, unspecified: Secondary | ICD-10-CM | POA: Diagnosis not present

## 2021-07-31 DIAGNOSIS — Z0000 Encounter for general adult medical examination without abnormal findings: Secondary | ICD-10-CM

## 2021-07-31 DIAGNOSIS — E7800 Pure hypercholesterolemia, unspecified: Secondary | ICD-10-CM | POA: Diagnosis not present

## 2021-07-31 DIAGNOSIS — F411 Generalized anxiety disorder: Secondary | ICD-10-CM

## 2021-07-31 MED ORDER — ATORVASTATIN CALCIUM 20 MG PO TABS: 20.0000 mg | ORAL_TABLET | Freq: Every day | ORAL | 3 refills | Status: DC

## 2021-07-31 NOTE — Patient Instructions (Addendum)
Please call the [facility] of [facility] to set up your bone density test    Try to get most of your carbohydrates from produce (with the exception of white potatoes)  Eat less bread/pasta/rice/snack foods/cereals/sweets and other items from the middle of the grocery store (processed carbs)  Keep exercising  Add in walking when you can   Wear your sunscreen

## 2021-07-31 NOTE — Assessment & Plan Note (Signed)
Doing well with buspar and xanax prn Reviewed stressors/ coping techniques/symptoms/ support sources/ tx options and side effects in detail today PHQ is 0

## 2021-07-31 NOTE — Progress Notes (Signed)
Subjective:    Patient ID: Tabitha Carlson, female    DOB: 11-06-1945, 76 y.o.   MRN: [id number]  HPI Here for health maintenance exam and to review chronic medical problems    Wt Readings from Last 3 Encounters:  07/31/21 157 lb (71.2 kg)  11/13/20 154 lb (69.9 kg)  10/22/20 154 lb (69.9 kg)   26.95 kg/m Stable   Immunization History  Administered Date(s) Administered   Fluad Quad(high Dose 65+) 10/13/2018   Influenza Split 11/01/2010, 10/23/2011   Influenza Whole 10/10/2009   Influenza, High Dose Seasonal PF 10/23/2014, 11/11/2016, 10/27/2017, 10/26/2019   Influenza, Seasonal, Injecte, Preservative Fre 11/14/2015   Influenza,inj,Quad PF,6+ Mos 10/26/2012, 10/20/2013   PFIZER(Purple Top)SARS-COV-2 Vaccination 03/26/2019, 04/18/2019, 12/07/2019   Pfizer Covid-19 Vaccine Bivalent Booster 43yr & up 01/12/2021   Pneumococcal Conjugate-13 06/04/2015   Pneumococcal Polysaccharide-23 10/20/2013   Td 03/04/2000, 03/20/2010   Zoster Recombinat (Shingrix) 07/10/2021   Zoster, Live 12/11/2014    Had first shingrix It caused burning/tingling in arm and chest   Struggling with insomnia  Wants to try melatonin 1 mg    Was walking regularly/now watches a child and no time  Is active with the baby  Uses exercise bands every day   Caffeine-not much  Uses decaf  Occ sweet tea    Mammogram 12/2020 Self breast exam: no lumps   Colonoscopy 12/2014 with 10 y recall   Dexa 10 2019 -improved osteopenia  Falls : none  Fx: none  Supplements vit D Boost for calcium  Exercise -exercise resistance bands   Mood: good     07/31/2021    8:37 AM 08/07/2020    2:41 PM 07/26/2020    9:33 AM 07/20/2019   10:50 AM 07/19/2018    8:54 AM  Depression screen PHQ 2/9  Decreased Interest 0 0 0 0 0  Down, Depressed, Hopeless 0 0 0 0 0  PHQ - 2 Score 0 0 0 0 0  Altered sleeping  0 0 0 0  Tired, decreased energy  0 0 0 0  Change in appetite  0 0 0 0  Feeling bad or failure about yourself    0 0 0 0  Trouble concentrating  0 0 0 0  Moving slowly or fidgety/restless  0 0 0 0  Suicidal thoughts  0 0 0 0  PHQ-9 Score  0 0 0 0  Difficult doing work/chores  Not difficult at all Not difficult at all Not difficult at all Not difficult at all     Buspar- works very well for anxiety  Very seldom panic attack  Xanax prn   BP Readings from Last 3 Encounters:  10/22/20 131/80  07/26/20 (!) 144/86  08/12/19 (!) 164/73   Pulse Readings from Last 3 Encounters:  10/22/20 91  07/26/20 75  08/12/19 87   Hyperlipidemia  Lab Results  Component Value Date   CHOL 159 07/22/2021   CHOL 168 07/19/2020   CHOL 169 07/20/2019   Lab Results  Component Value Date   HDL 55.20 07/22/2021   HDL 54.60 07/19/2020   HDL 50.90 07/20/2019   Lab Results  Component Value Date   LDLCALC 80 07/22/2021   LDLCALC 89 07/19/2020   LDLCALC 93 07/20/2019   Lab Results  Component Value Date   TRIG 115.0 07/22/2021   TRIG 125.0 07/19/2020   TRIG 126.0 07/20/2019   Lab Results  Component Value Date   CHOLHDL 3 07/22/2021   CHOLHDL 3 07/19/2020  CHOLHDL 3 07/20/2019   No results found for: "LDLDIRECT"  Atorvastatin 20 mg daily  Tolerates well  Diet is good overall   Cheats once in a while     Lab Results  Component Value Date   CREATININE 0.97 07/22/2021   BUN 21 07/22/2021   NA 138 07/22/2021   K 4.4 07/22/2021   CL 102 07/22/2021   CO2 29 07/22/2021   Lab Results  Component Value Date   ALT 17 07/22/2021   AST 18 07/22/2021   ALKPHOS 67 07/22/2021   BILITOT 0.5 07/22/2021   Lab Results  Component Value Date   WBC 4.7 07/22/2021   HGB 13.5 07/22/2021   HCT 41.3 07/22/2021   MCV 92.1 07/22/2021   PLT 185.0 07/22/2021   Lab Results  Component Value Date   TSH 2.34 07/22/2021    Patient Active Problem List   Diagnosis Date Noted   Medicare annual wellness visit, subsequent 06/29/2017   Estrogen deficiency 06/04/2015   Routine general medical examination at a  health care facility 05/18/2015   Encounter for Medicare annual wellness exam 05/18/2014   Osteopenia 10/20/2013   Gastroparesis 01/02/2008   ALLERGIC RHINITIS 09/12/2007   COLONIC POLYPS 03/16/2007   GERD 03/16/2007   Hyperlipidemia 02/21/2007   GAD (generalized anxiety disorder) 02/21/2007   IBS 02/21/2007   DEGENERATIVE JOINT DISEASE 02/21/2007   Past Medical History:  Diagnosis Date   Allergic rhinitis    Anxiety    Arthritis    in back. Physical Therapy 2016   Blood donor    Cancer ([place])    skin on back   Depression    DJD (degenerative joint disease)    Family history of ischemic heart disease    Gastroparesis    GERD (gastroesophageal reflux disease)    Hx of colonic polyps    Hypercholesteremia    Hyperlipidemia    IBS (irritable bowel syndrome)    Past Surgical History:  Procedure Laterality Date   ABDOMINAL HYSTERECTOMY  1985   TAH/BSO Age 22 DUB, pelvic pain   BREAST EXCISIONAL BIOPSY Left 1980, 1983   Benign Breast Biospy x 2   CYST EXCISION  04/2011   Thigh   left breast biopsy     benign x 2   SKIN CANCER EXCISION     on back   SKIN SURGERY     removal of small skin on face   Social History   Tobacco Use   Smoking status: Never   Smokeless tobacco: Never  Vaping Use   Vaping Use: Never used  Substance Use Topics   Alcohol use: No    Alcohol/week: 0.0 standard drinks of alcohol   Drug use: No   Family History  Problem Relation Age of Onset   Leukemia Mother    Hyperlipidemia Sister    Other Sister        2 cardiac episodes   Breast cancer Maternal Aunt    Breast cancer Maternal Aunt    Colon cancer Neg Hx    Allergies  Allergen Reactions   Aspirin     Unable to take a full aspirin--causes palpitations Tachycardia   Crab [Shellfish Allergy]     Rash from head to toe   Current Outpatient Medications on File Prior to Visit  Medication Sig Dispense Refill   acetaminophen (TYLENOL) 325 MG tablet Take 650 mg by mouth every 6 (six)  hours as needed.     ALPRAZolam (XANAX) 0.5 MG tablet TAKE 1 TABLET  BY MOUTH THREE TIMES A DAY AS NEEDED 90 tablet 0   busPIRone (BUSPAR) 15 MG tablet TAKE 1/2 TABLETS (7.5 MG TOTAL) BY MOUTH 2 (TWO) TIMES DAILY. 90 tablet 3   calcium gluconate 500 MG tablet Take 500 mg by mouth daily.     Casanthranol-Docusate Sodium 30-100 MG CAPS Take 2 capsules by mouth daily.     Cholecalciferol (VITAMIN D) 2000 UNITS CAPS Take 1 capsule by mouth daily.     Ibuprofen (ADVIL PO) Take by mouth as needed.     Multiple Vitamins-Minerals (MULTIPLE VITAMINS/WOMENS PO) Take 1 tablet by mouth daily.     OVER THE COUNTER MEDICATION Fiber gummies     triamcinolone cream (KENALOG) 0.1 % APPLY TO RASK TWICE A DAY AS DIRECTED FOR 3 WEEKS     No current facility-administered medications on file prior to visit.     Review of Systems  Constitutional:  Positive for fatigue. Negative for activity change, appetite change, fever and unexpected weight change.  HENT:  Negative for congestion, ear pain, rhinorrhea, sinus pressure and sore throat.   Eyes:  Negative for pain, redness and visual disturbance.  Respiratory:  Negative for cough, shortness of breath and wheezing.   Cardiovascular:  Negative for chest pain and palpitations.  Gastrointestinal:  Negative for abdominal pain, blood in stool, constipation and diarrhea.  Endocrine: Negative for polydipsia and polyuria.  Genitourinary:  Negative for dysuria, frequency and urgency.  Musculoskeletal:  Negative for arthralgias, back pain and myalgias.  Skin:  Negative for pallor and rash.  Allergic/Immunologic: Negative for environmental allergies.  Neurological:  Negative for dizziness, syncope and headaches.  Hematological:  Negative for adenopathy. Does not bruise/bleed easily.  Psychiatric/Behavioral:  Negative for decreased concentration and dysphoric mood. The patient is nervous/anxious.        Objective:   Physical Exam Constitutional:      General: She is not  in acute distress.    Appearance: Normal appearance. She is well-developed and normal weight. She is not ill-appearing or diaphoretic.  HENT:     Head: Normocephalic and atraumatic.     Right Ear: Tympanic membrane, ear canal and external ear normal.     Left Ear: Tympanic membrane, ear canal and external ear normal.     Nose: Nose normal. No congestion.     Mouth/Throat:     Mouth: Mucous membranes are moist.     Pharynx: Oropharynx is clear. No posterior oropharyngeal erythema.  Eyes:     General: No scleral icterus.    Extraocular Movements: Extraocular movements intact.     Conjunctiva/sclera: Conjunctivae normal.     Pupils: Pupils are equal, round, and reactive to light.  Neck:     Thyroid: No thyromegaly.     Vascular: No carotid bruit or JVD.  Cardiovascular:     Rate and Rhythm: Normal rate and regular rhythm.     Pulses: Normal pulses.     Heart sounds: Normal heart sounds.     No gallop.  Pulmonary:     Effort: Pulmonary effort is normal. No respiratory distress.     Breath sounds: Normal breath sounds. No wheezing.     Comments: Good air exch Chest:     Chest wall: No tenderness.  Abdominal:     General: Bowel sounds are normal. There is no distension or abdominal bruit.     Palpations: Abdomen is soft. There is no mass.     Tenderness: There is no abdominal tenderness.     Hernia:  No hernia is present.  Genitourinary:    Comments: Breast exam: No mass, nodules, thickening, tenderness, bulging, retraction, inflamation, nipple discharge or skin changes noted.  No axillary or clavicular LA.     Musculoskeletal:        General: No tenderness. Normal range of motion.     Cervical back: Normal range of motion and neck supple. No rigidity. No muscular tenderness.     Right lower leg: No edema.     Left lower leg: No edema.     Comments: No kyphosis   Lymphadenopathy:     Cervical: No cervical adenopathy.  Skin:    General: Skin is warm and dry.     Coloration: Skin  is not pale.     Findings: No erythema or rash.     Comments: Solar lentigines diffusely   Neurological:     Mental Status: She is alert. Mental status is at baseline.     Cranial Nerves: No cranial nerve deficit.     Motor: No abnormal muscle tone.     Coordination: Coordination normal.     Gait: Gait normal.     Deep Tendon Reflexes: Reflexes are normal and symmetric. Reflexes normal.  Psychiatric:        Mood and Affect: Mood normal.        Cognition and Memory: Cognition and memory normal.           Assessment & Plan:   Problem List Items Addressed This Visit       Musculoskeletal and Integument   Osteopenia    Due for dexa  Ordered, pt will schedule  No falls or fractures  Takes vit D High ca diet  Encouraged more exercise         Other   Estrogen deficiency   Relevant Orders   DG Bone Density   GAD (generalized anxiety disorder)    Doing well with buspar and xanax prn Reviewed stressors/ coping techniques/symptoms/ support sources/ tx options and side effects in detail today PHQ is 0      Hyperlipidemia   Relevant Medications   atorvastatin (LIPITOR) 20 MG tablet   Routine general medical examination at a health care facility - Primary    Reviewed health habits including diet and exercise and skin cancer prevention Reviewed appropriate screening tests for age  Also reviewed health mt list, fam hx and immunization status , as well as social and family history   See HPI Labs reviewed  Had first shingrix (some local reaction) Disc sleep hygiene and need for more exercise  Mammogram utd dexa ordered  Colonoscopy utd  phq score 0 Doing well

## 2021-07-31 NOTE — Assessment & Plan Note (Signed)
Reviewed health habits including diet and exercise and skin cancer prevention Reviewed appropriate screening tests for age  Also reviewed health mt list, fam hx and immunization status , as well as social and family history   See HPI Labs reviewed  Had first shingrix (some local reaction) Disc sleep hygiene and need for more exercise  Mammogram utd dexa ordered  Colonoscopy utd  phq score 0 Doing well

## 2021-08-14 DIAGNOSIS — Z0100 Encounter for examination of eyes and vision without abnormal findings: Secondary | ICD-10-CM | POA: Diagnosis not present

## 2021-08-18 ENCOUNTER — Telehealth: Payer: Self-pay | Admitting: Family Medicine

## 2021-08-18 NOTE — Telephone Encounter (Signed)
LVM for pt to rtn my call to schedule AWV with [facility] call back # [phone] 

## 2021-10-10 ENCOUNTER — Encounter: Payer: Self-pay | Admitting: Family Medicine

## 2021-10-10 ENCOUNTER — Ambulatory Visit (INDEPENDENT_AMBULATORY_CARE_PROVIDER_SITE_OTHER): Payer: Medicare HMO | Admitting: Family Medicine

## 2021-10-10 ENCOUNTER — Telehealth: Payer: Self-pay | Admitting: Family Medicine

## 2021-10-10 VITALS — BP 124/82 | HR 89 | Temp 98.1°F | Ht 64.0 in | Wt 160.1 lb

## 2021-10-10 DIAGNOSIS — R051 Acute cough: Secondary | ICD-10-CM

## 2021-10-10 MED ORDER — LIDOCAINE VISCOUS HCL 2 % MT SOLN: 5.0000 mL | Freq: Four times a day (QID) | OROMUCOSAL | 0 refills | Status: DC | PRN

## 2021-10-10 MED ORDER — AZITHROMYCIN 250 MG PO TABS: ORAL_TABLET | ORAL | 0 refills | Status: DC

## 2021-10-10 NOTE — Telephone Encounter (Signed)
Patient called in stating that she has been having a sore throat and cough with mucus since Tuesday. She stated that her husband came in to be seen with the same symptoms on last week. She would like to know if the same medication that was sent in for him,could be sent in for her,without having to come in to be seen? The medication was amoxicillin-clavulanate (AUGMENTIN) 875-125 MG tablet

## 2021-10-10 NOTE — Progress Notes (Addendum)
Patient ID: Tabitha Carlson, female    DOB: [birth year], 76 y.o.   MRN: [id number]  This visit was conducted in person.  BP 124/82   Pulse 89   Temp 98.1 F (36.7 C) (Oral)   Ht '5\' 4"'$  (1.626 m)   Wt 160 lb 2 oz (72.6 kg)   SpO2 96%   BMI 27.49 kg/m    CC: Chief Complaint  Patient presents with   Sore Throat    Started on Tuesday-Negative home Covid test this morning   Cough    With yellow phlegm    Subjective:   HPI: Tabitha Carlson is a 76 y.o. female  patient of Dr. Marliss Coots presenting on 10/10/2021 for Sore Throat (Started on Tuesday-Negative home Covid test this morning) and Cough (With yellow phlegm)  Date of onset: 3 days ago  Initially started with ST, productive cough, yellow in last 8 hours. No fever, chill. No myalgia.  She is feeling fatigued.  No ear pain,  no sinus pain.   No SOB, no wheeze.  Using tylenol, mucinex, chloropeptic spray.  Husband has been sick as well.  Treated with antibiotics, negative, COVID    Nonsmoker, no chronic lung disease. COVID 19 screen COVID testing: negative this AM COVID vaccine: yes x 4  COVID exposure: No recent travel or known exposure to COVID19  The importance of social distancing was discussed today.        Relevant past medical, surgical, family and social history reviewed and updated as indicated. Interim medical history since our last visit reviewed. Allergies and medications reviewed and updated. Outpatient Medications Prior to Visit  Medication Sig Dispense Refill   acetaminophen (TYLENOL) 325 MG tablet Take 650 mg by mouth every 6 (six) hours as needed.     ALPRAZolam (XANAX) 0.5 MG tablet TAKE 1 TABLET BY MOUTH THREE TIMES A DAY AS NEEDED 90 tablet 0   atorvastatin (LIPITOR) 20 MG tablet Take 1 tablet (20 mg total) by mouth daily. 90 tablet 3   busPIRone (BUSPAR) 15 MG tablet TAKE 1/2 TABLETS (7.5 MG TOTAL) BY MOUTH 2 (TWO) TIMES DAILY. 90 tablet 3   calcium gluconate 500 MG tablet Take 500 mg by mouth  daily.     Casanthranol-Docusate Sodium 30-100 MG CAPS Take 2 capsules by mouth daily.     Cholecalciferol (VITAMIN D) 2000 UNITS CAPS Take 1 capsule by mouth daily.     Ibuprofen (ADVIL PO) Take by mouth as needed.     Multiple Vitamins-Minerals (MULTIPLE VITAMINS/WOMENS PO) Take 1 tablet by mouth daily.     OVER THE COUNTER MEDICATION Fiber gummies     triamcinolone cream (KENALOG) 0.1 % APPLY TO RASK TWICE A DAY AS DIRECTED FOR 3 WEEKS     No facility-administered medications prior to visit.     Per HPI unless specifically indicated in ROS section below Review of Systems  Constitutional:  Negative for fatigue and fever.  HENT:  Negative for ear pain.   Eyes:  Negative for pain.  Respiratory:  Negative for chest tightness and shortness of breath.   Cardiovascular:  Negative for chest pain, palpitations and leg swelling.  Gastrointestinal:  Negative for abdominal pain.  Genitourinary:  Negative for dysuria.   Objective:  BP 124/82   Pulse 89   Temp 98.1 F (36.7 C) (Oral)   Ht '5\' 4"'$  (1.626 m)   Wt 160 lb 2 oz (72.6 kg)   SpO2 96%   BMI 27.49 kg/m   Wt  Readings from Last 3 Encounters:  10/10/21 160 lb 2 oz (72.6 kg)  07/31/21 157 lb (71.2 kg)  11/13/20 154 lb (69.9 kg)      Physical Exam Constitutional:      General: She is not in acute distress.    Appearance: Normal appearance. She is well-developed. She is not ill-appearing or toxic-appearing.  HENT:     Head: Normocephalic.     Right Ear: Hearing, tympanic membrane, ear canal and external ear normal. Tympanic membrane is not erythematous, retracted or bulging.     Left Ear: Hearing, tympanic membrane, ear canal and external ear normal. Tympanic membrane is not erythematous, retracted or bulging.     Nose: No mucosal edema or rhinorrhea.     Right Sinus: No maxillary sinus tenderness or frontal sinus tenderness.     Left Sinus: No maxillary sinus tenderness or frontal sinus tenderness.     Mouth/Throat:      Pharynx: Uvula midline. Posterior oropharyngeal erythema present.  Eyes:     General: Lids are normal. Lids are everted, no foreign bodies appreciated.     Conjunctiva/sclera: Conjunctivae normal.     Pupils: Pupils are equal, round, and reactive to light.  Neck:     Thyroid: No thyroid mass or thyromegaly.     Vascular: No carotid bruit.     Trachea: Trachea normal.  Cardiovascular:     Rate and Rhythm: Normal rate and regular rhythm.     Pulses: Normal pulses.     Heart sounds: Normal heart sounds, S1 normal and S2 normal. No murmur heard.    No friction rub. No gallop.  Pulmonary:     Effort: Pulmonary effort is normal. No tachypnea or respiratory distress.     Breath sounds: Normal breath sounds. No decreased breath sounds, wheezing, rhonchi or rales.  Abdominal:     General: Bowel sounds are normal.     Palpations: Abdomen is soft.     Tenderness: There is no abdominal tenderness.  Musculoskeletal:     Cervical back: Normal range of motion and neck supple.  Skin:    General: Skin is warm and dry.     Findings: No rash.  Neurological:     Mental Status: She is alert.  Psychiatric:        Mood and Affect: Mood is not anxious or depressed.        Speech: Speech normal.        Behavior: Behavior normal. Behavior is cooperative.        Thought Content: Thought content normal.        Judgment: Judgment normal.       Results for orders placed or performed in visit on 07/22/21  CBC with Differential/Platelet  Result Value Ref Range   WBC 4.7 4.0 - 10.5 K/uL   RBC 4.48 3.87 - 5.11 Mil/uL   Hemoglobin 13.5 12.0 - 15.0 g/dL   HCT 41.3 36.0 - 46.0 %   MCV 92.1 78.0 - 100.0 fl   MCHC 32.8 30.0 - 36.0 g/dL   RDW 13.1 11.5 - 15.5 %   Platelets 185.0 150.0 - 400.0 K/uL   Neutrophils Relative % 41.5 (L) 43.0 - 77.0 %   Lymphocytes Relative 44.0 12.0 - 46.0 %   Monocytes Relative 8.4 3.0 - 12.0 %   Eosinophils Relative 5.5 (H) 0.0 - 5.0 %   Basophils Relative 0.6 0.0 - 3.0 %    Neutro Abs 2.0 1.4 - 7.7 K/uL   Lymphs Abs 2.1  0.7 - 4.0 K/uL   Monocytes Absolute 0.4 0.1 - 1.0 K/uL   Eosinophils Absolute 0.3 0.0 - 0.7 K/uL   Basophils Absolute 0.0 0.0 - 0.1 K/uL  Comprehensive metabolic panel  Result Value Ref Range   Sodium 138 135 - 145 mEq/L   Potassium 4.4 3.5 - 5.1 mEq/L   Chloride 102 96 - 112 mEq/L   CO2 29 19 - 32 mEq/L   Glucose, Bld 101 (H) 70 - 99 mg/dL   BUN 21 6 - 23 mg/dL   Creatinine, Ser 0.97 0.40 - 1.20 mg/dL   Total Bilirubin 0.5 0.2 - 1.2 mg/dL   Alkaline Phosphatase 67 39 - 117 U/L   AST 18 0 - 37 U/L   ALT 17 0 - 35 U/L   Total Protein 6.7 6.0 - 8.3 g/dL   Albumin 4.3 3.5 - 5.2 g/dL   GFR 57.56 (L) >60.00 mL/min   Calcium 9.9 8.4 - 10.5 mg/dL  Lipid panel  Result Value Ref Range   Cholesterol 159 0 - 200 mg/dL   Triglycerides 115.0 0.0 - 149.0 mg/dL   HDL 55.20 >39.00 mg/dL   VLDL 23.0 0.0 - 40.0 mg/dL   LDL Cholesterol 80 0 - 99 mg/dL   Total CHOL/HDL Ratio 3    NonHDL 103.35   TSH  Result Value Ref Range   TSH 2.34 0.35 - 5.50 uIU/mL     COVID 19 screen:  No recent travel or known exposure to COVID19 The patient denies respiratory symptoms of COVID 19 at this time. The importance of social distancing was discussed today.   Assessment and Plan    Problem List Items Addressed This Visit     Acute cough - Primary    Acute, most likely viral infection. Negative COVID testing on day 3 of illness at home.  Can consider retesting day 5.  Rest fluids, symptomatic care.  Treat with ibuprofen and Magic mouthwash for severe sore throat.  Continue Mucinex DM for cough.  If not improving by day 7-10 of illness, she can fill the prescription for antibiotics to cover for bacterial superinfection/bacterial bronchitis        Meds ordered this encounter  Medications   magic mouthwash (lidocaine, diphenhydrAMINE, alum & mag hydroxide) suspension    Sig: Swish and swallow 5 mLs 4 (four) times daily as needed for mouth pain.     Dispense:  360 mL    Refill:  0   azithromycin (ZITHROMAX) 250 MG tablet    Sig: 2 tab po x 1 day then 1 tab po daily  FILL IF NOT IMPROVING IN 2-3 Days, VOID after 10/24/2021    Dispense:  6 tablet    Refill:  0     Eliezer Lofts, MD

## 2021-10-10 NOTE — Telephone Encounter (Signed)
Needs a visit

## 2021-10-10 NOTE — Assessment & Plan Note (Addendum)
Acute, most likely viral infection. Negative COVID testing on day 3 of illness at home.  Can consider retesting day 5.  Rest fluids, symptomatic care.  Treat with ibuprofen and Magic mouthwash for severe sore throat.  Continue Mucinex DM for cough.  If not improving by day 7-10 of illness, she can fill the prescription for antibiotics to cover for bacterial superinfection/bacterial bronchitis

## 2021-10-10 NOTE — Patient Instructions (Addendum)
Continue Mucinex DM twice daily.  Change tylenol  to ibuprofen 600 mg every 6-8 hours as needed for sore throat.  Can use Magic Mouthwash  for sore throat if needed.  If not improving in 2 to 3 days, fill antibiotics. Go to the emergency room if severe shortness of breath or wheezing.

## 2021-10-10 NOTE — Telephone Encounter (Signed)
Patient has been scheduled

## 2021-10-24 DIAGNOSIS — Z0100 Encounter for examination of eyes and vision without abnormal findings: Secondary | ICD-10-CM | POA: Diagnosis not present

## 2021-11-04 DIAGNOSIS — L821 Other seborrheic keratosis: Secondary | ICD-10-CM | POA: Diagnosis not present

## 2021-11-04 DIAGNOSIS — Z85828 Personal history of other malignant neoplasm of skin: Secondary | ICD-10-CM | POA: Diagnosis not present

## 2021-11-04 DIAGNOSIS — L814 Other melanin hyperpigmentation: Secondary | ICD-10-CM | POA: Diagnosis not present

## 2021-11-04 DIAGNOSIS — D2261 Melanocytic nevi of right upper limb, including shoulder: Secondary | ICD-10-CM | POA: Diagnosis not present

## 2021-11-04 DIAGNOSIS — D2372 Other benign neoplasm of skin of left lower limb, including hip: Secondary | ICD-10-CM | POA: Diagnosis not present

## 2021-11-10 ENCOUNTER — Other Ambulatory Visit: Payer: Self-pay | Admitting: Family Medicine

## 2021-11-25 ENCOUNTER — Other Ambulatory Visit: Payer: Self-pay | Admitting: Family Medicine

## 2021-11-25 DIAGNOSIS — Z1231 Encounter for screening mammogram for malignant neoplasm of breast: Secondary | ICD-10-CM

## 2021-11-28 ENCOUNTER — Telehealth: Payer: Self-pay | Admitting: Family Medicine

## 2021-11-28 NOTE — Telephone Encounter (Signed)
Left message for patient to call back and schedule Medicare Annual Wellness Visit (AWV) either virtually or phone  Left  my jabber number [phone]   Last AWV  08/07/20    45 min for awv-i and in office appointments 30 min for awv-s  phone/virtual appointments

## 2021-12-17 ENCOUNTER — Ambulatory Visit (INDEPENDENT_AMBULATORY_CARE_PROVIDER_SITE_OTHER): Payer: Medicare HMO

## 2021-12-17 VITALS — Ht 64.0 in | Wt 160.0 lb

## 2021-12-17 DIAGNOSIS — Z0000 Encounter for general adult medical examination without abnormal findings: Secondary | ICD-10-CM | POA: Diagnosis not present

## 2021-12-17 NOTE — Patient Instructions (Signed)
Tabitha Carlson , Thank you for taking time to come for your Medicare Wellness Visit. I appreciate your ongoing commitment to your health goals. Please review the following plan we discussed and let me know if I can assist you in the future.   Screening recommendations/referrals: Colonoscopy: 12/19/14 Mammogram: scheduled 01/13/22 Bone Density: scheduled 01/13/22  Recommended yearly ophthalmology/optometry visit for glaucoma screening and checkup Recommended yearly dental visit for hygiene and checkup  Vaccinations: Influenza vaccine: n/d Pneumococcal vaccine: 06/04/15 Tdap vaccine: 03/20/10, due if have injury Shingles vaccine: Zostavax 12/11/14  Shingrix 07/10/21   Covid-19:03/26/19, 04/18/19, 11/29/19, 01/12/21, 11/11/21  Advanced directives: no  Conditions/risks identified: none  Next appointment: Follow up in one year for your annual wellness visit 12/21/22 @ 1:15 pm by phone   Preventive Care 65 Years and Older, Female Preventive care refers to lifestyle choices and visits with your health care provider that can promote health and wellness. What does preventive care include? A yearly physical exam. This is also called an annual well check. Dental exams once or twice a year. Routine eye exams. Ask your health care provider how often you should have your eyes checked. Personal lifestyle choices, including: Daily care of your teeth and gums. Regular physical activity. Eating a healthy diet. Avoiding tobacco and drug use. Limiting alcohol use. Practicing safe sex. Taking low-dose aspirin every day. Taking vitamin and mineral supplements as recommended by your health care provider. What happens during an annual well check? The services and screenings done by your health care provider during your annual well check will depend on your age, overall health, lifestyle risk factors, and family history of disease. Counseling  Your health care provider may ask you questions about your: Alcohol  use. Tobacco use. Drug use. Emotional well-being. Home and relationship well-being. Sexual activity. Eating habits. History of falls. Memory and ability to understand (cognition). Work and work Statistician. Reproductive health. Screening  You may have the following tests or measurements: Height, weight, and BMI. Blood pressure. Lipid and cholesterol levels. These may be checked every 5 years, or more frequently if you are over 63 years old. Skin check. Lung cancer screening. You may have this screening every year starting at age 29 if you have a 30-pack-year history of smoking and currently smoke or have quit within the past 15 years. Fecal occult blood test (FOBT) of the stool. You may have this test every year starting at age 54. Flexible sigmoidoscopy or colonoscopy. You may have a sigmoidoscopy every 5 years or a colonoscopy every 10 years starting at age 38. Hepatitis C blood test. Hepatitis B blood test. Sexually transmitted disease (STD) testing. Diabetes screening. This is done by checking your blood sugar (glucose) after you have not eaten for a while (fasting). You may have this done every 1-3 years. Bone density scan. This is done to screen for osteoporosis. You may have this done starting at age 9. Mammogram. This may be done every 1-2 years. Talk to your health care provider about how often you should have regular mammograms. Talk with your health care provider about your test results, treatment options, and if necessary, the need for more tests. Vaccines  Your health care provider may recommend certain vaccines, such as: Influenza vaccine. This is recommended every year. Tetanus, diphtheria, and acellular pertussis (Tdap, Td) vaccine. You may need a Td booster every 10 years. Zoster vaccine. You may need this after age 14. Pneumococcal 13-valent conjugate (PCV13) vaccine. One dose is recommended after age 56. Pneumococcal polysaccharide (PPSV23)  vaccine. One dose is  recommended after age 63. Talk to your health care provider about which screenings and vaccines you need and how often you need them. This information is not intended to replace advice given to you by your health care provider. Make sure you discuss any questions you have with your health care provider. Document Released: 02/22/2015 Document Revised: 10/16/2015 Document Reviewed: 11/27/2014 Elsevier Interactive Patient Education  2017 [employer] Prevention in the Home Falls can cause injuries. They can happen to people of all ages. There are many things you can do to make your home safe and to help prevent falls. What can I do on the outside of my home? Regularly fix the edges of walkways and driveways and fix any cracks. Remove anything that might make you trip as you walk through a door, such as a raised step or threshold. Trim any bushes or trees on the path to your home. Use bright outdoor lighting. Clear any walking paths of anything that might make someone trip, such as rocks or tools. Regularly check to see if handrails are loose or broken. Make sure that both sides of any steps have handrails. Any raised decks and porches should have guardrails on the edges. Have any leaves, snow, or ice cleared regularly. Use sand or salt on walking paths during winter. Clean up any spills in your garage right away. This includes oil or grease spills. What can I do in the bathroom? Use night lights. Install grab bars by the toilet and in the tub and shower. Do not use towel bars as grab bars. Use non-skid mats or decals in the tub or shower. If you need to sit down in the shower, use a plastic, non-slip stool. Keep the floor dry. Clean up any water that spills on the floor as soon as it happens. Remove soap buildup in the tub or shower regularly. Attach bath mats securely with double-sided non-slip rug tape. Do not have throw rugs and other things on the floor that can make you  trip. What can I do in the bedroom? Use night lights. Make sure that you have a light by your bed that is easy to reach. Do not use any sheets or blankets that are too big for your bed. They should not hang down onto the floor. Have a firm chair that has side arms. You can use this for support while you get dressed. Do not have throw rugs and other things on the floor that can make you trip. What can I do in the kitchen? Clean up any spills right away. Avoid walking on wet floors. Keep items that you use a lot in easy-to-reach places. If you need to reach something above you, use a strong step stool that has a grab bar. Keep electrical cords out of the way. Do not use floor polish or wax that makes floors slippery. If you must use wax, use non-skid floor wax. Do not have throw rugs and other things on the floor that can make you trip. What can I do with my stairs? Do not leave any items on the stairs. Make sure that there are handrails on both sides of the stairs and use them. Fix handrails that are broken or loose. Make sure that handrails are as long as the stairways. Check any carpeting to make sure that it is firmly attached to the stairs. Fix any carpet that is loose or worn. Avoid having throw rugs at the top or bottom  of the stairs. If you do have throw rugs, attach them to the floor with carpet tape. Make sure that you have a light switch at the top of the stairs and the bottom of the stairs. If you do not have them, ask someone to add them for you. What else can I do to help prevent falls? Wear shoes that: Do not have high heels. Have rubber bottoms. Are comfortable and fit you well. Are closed at the toe. Do not wear sandals. If you use a stepladder: Make sure that it is fully opened. Do not climb a closed stepladder. Make sure that both sides of the stepladder are locked into place. Ask someone to hold it for you, if possible. Clearly mark and make sure that you can  see: Any grab bars or handrails. First and last steps. Where the edge of each step is. Use tools that help you move around (mobility aids) if they are needed. These include: Canes. Walkers. Scooters. Crutches. Turn on the lights when you go into a dark area. Replace any light bulbs as soon as they burn out. Set up your furniture so you have a clear path. Avoid moving your furniture around. If any of your floors are uneven, fix them. If there are any pets around you, be aware of where they are. Review your medicines with your doctor. Some medicines can make you feel dizzy. This can increase your chance of falling. Ask your doctor what other things that you can do to help prevent falls. This information is not intended to replace advice given to you by your health care provider. Make sure you discuss any questions you have with your health care provider. Document Released: 11/22/2008 Document Revised: 07/04/2015 Document Reviewed: 03/02/2014 Elsevier Interactive Patient Education  2017 [employer].

## 2021-12-17 NOTE — Progress Notes (Signed)
Virtual Visit via Telephone Note  I connected with  Tabitha Carlson on 12/17/21 at  3:00 PM EST by telephone and verified that I am speaking with the correct person using two identifiers.  Location: Patient: home Provider: [place] Persons participating in the virtual visit: [place]   I discussed the limitations, risks, security and privacy concerns of performing an evaluation and management service by telephone and the availability of in person appointments. The patient expressed understanding and agreed to proceed.  Interactive audio and video telecommunications were attempted between this nurse and patient, however failed, due to patient having technical difficulties OR patient did not have access to video capability.  We continued and completed visit with audio only.  Some vital signs may be absent or patient reported.   Dionisio David, LPN  Subjective:   Tabitha Carlson is a 76 y.o. female who presents for Medicare Annual (Subsequent) preventive examination.  Review of Systems     Cardiac Risk Factors include: advanced age (>63mn, >>75women)     Objective:    There were no vitals filed for this visit. There is no height or weight on file to calculate BMI.     12/17/2021    3:08 PM 08/07/2020    2:39 PM 08/12/2019    1:04 PM 07/20/2019   10:49 AM 07/19/2018    8:55 AM 06/29/2017    9:11 AM 06/18/2016   12:33 PM  Advanced Directives  Does Patient Have a Medical Advance Directive? No No No No No No No  Does patient want to make changes to medical advance directive?  Yes (MAU/Ambulatory/Procedural Areas - Information given)    Yes (MAU/Ambulatory/Procedural Areas - Information given)   Would patient like information on creating a medical advance directive? No - Patient declined  No - Patient declined No - Patient declined No - Patient declined      Current Medications (verified) Outpatient Encounter Medications as of 12/17/2021  Medication Sig    acetaminophen (TYLENOL) 325 MG tablet Take 650 mg by mouth every 6 (six) hours as needed.   ALPRAZolam (XANAX) 0.5 MG tablet TAKE 1 TABLET BY MOUTH THREE TIMES A DAY AS NEEDED   atorvastatin (LIPITOR) 20 MG tablet Take 1 tablet (20 mg total) by mouth daily.   busPIRone (BUSPAR) 15 MG tablet TAKE 1/2 TABLETS (7.5 MG TOTAL) BY MOUTH 2 (TWO) TIMES DAILY.   Casanthranol-Docusate Sodium 30-100 MG CAPS Take 2 capsules by mouth daily.   Cholecalciferol (VITAMIN D) 2000 UNITS CAPS Take 1 capsule by mouth daily.   Ibuprofen (ADVIL PO) Take by mouth as needed.   Multiple Vitamins-Minerals (MULTIPLE VITAMINS/WOMENS PO) Take 1 tablet by mouth daily.   OVER THE COUNTER MEDICATION Fiber gummies   triamcinolone cream (KENALOG) 0.1 % APPLY TO RASK TWICE A DAY AS DIRECTED FOR 3 WEEKS   azithromycin (ZITHROMAX) 250 MG tablet 2 tab po x 1 day then 1 tab po daily  FILL IF NOT IMPROVING IN 2-3 Days, VOID after 10/24/2021 (Patient not taking: Reported on 12/17/2021)   calcium gluconate 500 MG tablet Take 500 mg by mouth daily. (Patient not taking: Reported on 12/17/2021)   magic mouthwash (lidocaine, diphenhydrAMINE, alum & mag hydroxide) suspension Swish and swallow 5 mLs 4 (four) times daily as needed for mouth pain. (Patient not taking: Reported on 12/17/2021)   No facility-administered encounter medications on file as of 12/17/2021.    Allergies (verified) Aspirin and Crab [shellfish allergy]   History: Past Medical  History:  Diagnosis Date   Allergic rhinitis    Anxiety    Arthritis    in back. Physical Therapy 2016   Blood donor    Cancer ([place])    skin on back   Depression    DJD (degenerative joint disease)    Family history of ischemic heart disease    Gastroparesis    GERD (gastroesophageal reflux disease)    Hx of colonic polyps    Hypercholesteremia    Hyperlipidemia    IBS (irritable bowel syndrome)    Past Surgical History:  Procedure Laterality Date   ABDOMINAL HYSTERECTOMY  1985    TAH/BSO Age 49 DUB, pelvic pain   BREAST EXCISIONAL BIOPSY Left 1980, 1983   Benign Breast Biospy x 2   CYST EXCISION  04/2011   Thigh   left breast biopsy     benign x 2   SKIN CANCER EXCISION     on back   SKIN SURGERY     removal of small skin on face   Family History  Problem Relation Age of Onset   Leukemia Mother    Hyperlipidemia Sister    Other Sister        2 cardiac episodes   Breast cancer Maternal Aunt    Breast cancer Maternal Aunt    Colon cancer Neg Hx    Social History   Socioeconomic History   Marital status: Married    Spouse name: Printmaker   Number of children: 2   Years of education: Not on file   Highest education level: Not on file  Occupational History   Occupation: housewife  Tobacco Use   Smoking status: Never   Smokeless tobacco: Never  Vaping Use   Vaping Use: Never used  Substance and Sexual Activity   Alcohol use: No    Alcohol/week: 0.0 standard drinks of alcohol   Drug use: No   Sexual activity: Yes    Partners: Male    Birth control/protection: Surgical    Comment: Hysterectomy  Other Topics Concern   Not on file  Social History Narrative   Not on file   Social Determinants of Health   Financial Resource Strain: Low Risk  (12/17/2021)   Overall Financial Resource Strain (CARDIA)    Difficulty of Paying Living Expenses: Not hard at all  Food Insecurity: No Food Insecurity (12/17/2021)   Hunger Vital Sign    Worried About Running Out of Food in the Last Year: Never true    [place] in the Last Year: Never true  Transportation Needs: No Transportation Needs (12/17/2021)   PRAPARE - Hydrologist (Medical): No    Lack of Transportation (Non-Medical): No  Physical Activity: Sufficiently Active (12/17/2021)   Exercise Vital Sign    Days of Exercise per Week: 7 days    Minutes of Exercise per Session: 40 min  Stress: No Stress Concern Present (12/17/2021)   [place]    Feeling of Stress : Not at all  Social Connections: Moderately Integrated (12/17/2021)   Social Connection and Isolation Panel [NHANES]    Frequency of Communication with Friends and Family: More than three times a week    Frequency of Social Gatherings with Friends and Family: More than three times a week    Attends Religious Services: More than 4 times per year    Active Member of Clubs or Organizations: No  Attends Archivist Meetings: Never    Marital Status: Married    Tobacco Counseling Counseling given: Not Answered   Clinical Intake:  Pre-visit preparation completed: Yes  Pain : No/denies pain     Nutritional Risks: None Diabetes: No  How often do you need to have someone help you when you read instructions, pamphlets, or other written materials from your doctor or pharmacy?: 1 - Never  Diabetic?no  Interpreter Needed?: No  Information entered by :: Kirke Shaggy, LPN   Activities of Daily Living    12/17/2021    3:09 PM  In your present state of health, do you have any difficulty performing the following activities:  Hearing? 0  Vision? 0  Difficulty concentrating or making decisions? 0  Walking or climbing stairs? 0  Dressing or bathing? 0  Doing errands, shopping? 0  Preparing Food and eating ? N  Using the Toilet? N  In the past six months, have you accidently leaked urine? N  Do you have problems with loss of bowel control? N  Managing your Medications? N  Managing your Finances? N  Housekeeping or managing your Housekeeping? N    Patient Care Team: Tower, Wynelle Fanny, MD as PCP - General (Family Medicine) Ralene Bathe, MD as Consulting Physician (Ophthalmology) Regina Eck, CNM as Referring Physician (Certified Nurse Midwife)  Indicate any recent Medical Services you may have received from other than [facility] providers in the past year (date may be approximate).     Assessment:    This is a routine wellness examination for [place].  Hearing/Vision screen Hearing Screening - Comments:: No aids Vision Screening - Comments:: Wears glasses/contacts- Dr.Gould  Dietary issues and exercise activities discussed: Current Exercise Habits: Home exercise routine, Type of exercise: walking, Time (Minutes): 45, Frequency (Times/Week): 7, Weekly Exercise (Minutes/Week): 315, Intensity: Mild   Goals Addressed             This Visit's Progress    DIET - EAT MORE FRUITS AND VEGETABLES         Depression Screen    12/17/2021    3:07 PM 07/31/2021    8:37 AM 08/07/2020    2:41 PM 07/26/2020    9:33 AM 07/20/2019   10:50 AM 07/19/2018    8:54 AM 06/29/2017    9:18 AM  PHQ 2/9 Scores  PHQ - 2 Score 0 0 0 0 0 0 0  PHQ- 9 Score 0  0 0 0 0 0    Fall Risk    12/17/2021    3:09 PM 07/31/2021    8:37 AM 08/07/2020    2:41 PM 07/26/2020    9:34 AM 07/20/2019   10:50 AM  [place] in the past year? 0 0 0 0 0  Number falls in past yr: 0  0 0 0  Injury with Fall? 0  0 0 0  Risk for fall due to : No Fall Risks  Medication side effect  No Fall Risks  Follow up Falls prevention discussed;Falls evaluation completed Falls evaluation completed Falls evaluation completed;Falls prevention discussed  Falls evaluation completed;Falls prevention discussed    FALL RISK PREVENTION PERTAINING TO THE HOME:  Any stairs in or around the home? No  If so, are there any without handrails? No  Home free of loose throw rugs in walkways, pet beds, electrical cords, etc? Yes  Adequate lighting in your home to reduce risk of falls? Yes   ASSISTIVE DEVICES UTILIZED TO PREVENT FALLS:  Life alert? No  Use of a cane, walker or w/c? No  Grab bars in the bathroom? No  Shower chair or bench in shower? Yes  Elevated toilet seat or a handicapped toilet? Yes   Cognitive Function:    08/07/2020    2:47 PM 07/20/2019   10:56 AM 07/19/2018    8:55 AM 06/29/2017    9:22 AM 06/18/2016   12:38 PM  MMSE -  Mini Mental State Exam  Orientation to time '5 5 5 5 5  '$ Orientation to Place '5 5 5 5 5  '$ Registration '3 3 3 3 3  '$ Attention/ Calculation 5 5 0 0 0  Recall '3 3 3 3 3  '$ Language- name 2 objects   0 0 0  Language- repeat '1 1 1 1 1  '$ Language- follow 3 step command   0 3 3  Language- read & follow direction   0 0 0  Write a sentence   0 0 0  Copy design   0 0 0  Total score   '17 20 20        '$ 12/17/2021    3:10 PM  6CIT Screen  What Year? 0 points  What month? 0 points  What time? 0 points  Count back from 20 0 points  Months in reverse 0 points  Repeat phrase 0 points  Total Score 0 points    Immunizations Immunization History  Administered Date(s) Administered   Fluad Quad(high Dose 65+) 10/13/2018, 11/22/2021   Influenza Split 11/01/2010, 10/23/2011   Influenza Whole 10/10/2009   Influenza, High Dose Seasonal PF 10/23/2014, 11/11/2016, 10/27/2017, 10/26/2019   Influenza, Seasonal, Injecte, Preservative Fre 11/14/2015   Influenza,inj,Quad PF,6+ Mos 10/26/2012, 10/20/2013   PFIZER Comirnaty(Gray Top)Covid-19 Tri-Sucrose Vaccine 11/11/2021   PFIZER(Purple Top)SARS-COV-2 Vaccination 03/26/2019, 04/18/2019, 12/07/2019   Pfizer Covid-19 Vaccine Bivalent Booster 31yr & up 01/12/2021   Pneumococcal Conjugate-13 06/04/2015   Pneumococcal Polysaccharide-23 10/20/2013   Td 03/04/2000, 03/20/2010   Zoster Recombinat (Shingrix) 07/10/2021   Zoster, Live 12/11/2014    TDAP status: Due, Education has been provided regarding the importance of this vaccine. Advised may receive this vaccine at local pharmacy or Health Dept. Aware to provide a copy of the vaccination record if obtained from local pharmacy or Health Dept. Verbalized acceptance and understanding.  Flu Vaccine status: Up to date  Pneumococcal vaccine status: Up to date  Covid-19 vaccine status: Completed vaccines  Qualifies for Shingles Vaccine? Yes   Zostavax completed Yes   Shingrix Completed?: No.    Education has been  provided regarding the importance of this vaccine. Patient has been advised to call insurance company to determine out of pocket expense if they have not yet received this vaccine. Advised may also receive vaccine at local pharmacy or Health Dept. Verbalized acceptance and understanding.  Screening Tests Health Maintenance  Topic Date Due   Zoster Vaccines- Shingrix (2 of 2) 09/04/2021   TETANUS/TDAP  07/27/2030 (Originally 03/20/2020)   MAMMOGRAM  01/08/2022   COVID-19 Vaccine (5 - Pfizer series) 03/14/2022   Medicare Annual Wellness (AWV)  12/18/2022   COLONOSCOPY (Pts 45-46yrInsurance coverage will need to be confirmed)  12/18/2024   Pneumonia Vaccine 6575Years old  Completed   INFLUENZA VACCINE  Completed   DEXA SCAN  Completed   Hepatitis C Screening  Completed   HPV VACCINES  Aged Out    Health Maintenance  Health Maintenance Due  Topic Date Due   Zoster Vaccines- Shingrix (2 of 2) 09/04/2021  Colorectal cancer screening: Type of screening: Colonoscopy. Completed 12/19/14. Repeat every 10 years  Mammogram status: Completed scheduled 01/13/22. Repeat every year  Bone Density status: Completed scheduled 01/13/22. Results reflect: Bone density results: NORMAL. Repeat every 5 years.  Lung Cancer Screening: (Low Dose CT Chest recommended if Age 36-80 years, 30 pack-year currently smoking OR have quit w/in 15years.) does not qualify.   Additional Screening:  Hepatitis C Screening: does qualify; Completed 06/18/15  Vision Screening: Recommended annual ophthalmology exams for early detection of glaucoma and other disorders of the eye. Is the patient up to date with their annual eye exam?  Yes  Who is the provider or what is the name of the office in which the patient attends annual eye exams? Dr.Gould If pt is not established with a provider, would they like to be referred to a provider to establish care? No .   Dental Screening: Recommended annual dental exams for proper oral  hygiene  Community Resource Referral / Chronic Care Management: CRR required this visit?  No   CCM required this visit?  No      Plan:     I have personally reviewed and noted the following in the patient's chart:   Medical and social history Use of alcohol, tobacco or illicit drugs  Current medications and supplements including opioid prescriptions. Patient is not currently taking opioid prescriptions. Functional ability and status Nutritional status Physical activity Advanced directives List of other physicians Hospitalizations, surgeries, and ER visits in previous 12 months Vitals Screenings to include cognitive, depression, and falls Referrals and appointments  In addition, I have reviewed and discussed with patient certain preventive protocols, quality metrics, and best practice recommendations. A written personalized care plan for preventive services as well as general preventive health recommendations were provided to patient.     Dionisio David, LPN   10/17/1189   Nurse Notes: none

## 2021-12-24 DIAGNOSIS — H43813 Vitreous degeneration, bilateral: Secondary | ICD-10-CM | POA: Diagnosis not present

## 2021-12-24 DIAGNOSIS — H5213 Myopia, bilateral: Secondary | ICD-10-CM | POA: Diagnosis not present

## 2022-01-13 ENCOUNTER — Ambulatory Visit
Admission: RE | Admit: 2022-01-13 | Discharge: 2022-01-13 | Disposition: A | Discharge status: Home / Self Care | Payer: Medicare HMO | Source: Ambulatory Visit | Attending: Family Medicine | Admitting: Family Medicine

## 2022-01-13 DIAGNOSIS — Z780 Asymptomatic menopausal state: Secondary | ICD-10-CM | POA: Diagnosis not present

## 2022-01-13 DIAGNOSIS — Z1231 Encounter for screening mammogram for malignant neoplasm of breast: Secondary | ICD-10-CM | POA: Diagnosis not present

## 2022-01-13 DIAGNOSIS — M8589 Other specified disorders of bone density and structure, multiple sites: Secondary | ICD-10-CM | POA: Diagnosis not present

## 2022-01-13 DIAGNOSIS — E2839 Other primary ovarian failure: Secondary | ICD-10-CM

## 2022-02-15 IMAGING — MG MM DIGITAL SCREENING BILAT W/ TOMO AND CAD
8 series · 8 of 24 positions shown · non-contrast
Comparison: Previous exam(s).

CLINICAL DATA: Screening.

EXAM:
DIGITAL SCREENING BILATERAL MAMMOGRAM WITH TOMOSYNTHESIS AND CAD
TECHNIQUE: Bilateral screening digital craniocaudal and mediolateral oblique
mammograms were obtained. Bilateral screening digital breast
tomosynthesis was performed. The images were evaluated with
computer-aided detection.

[L CC synth-2D]
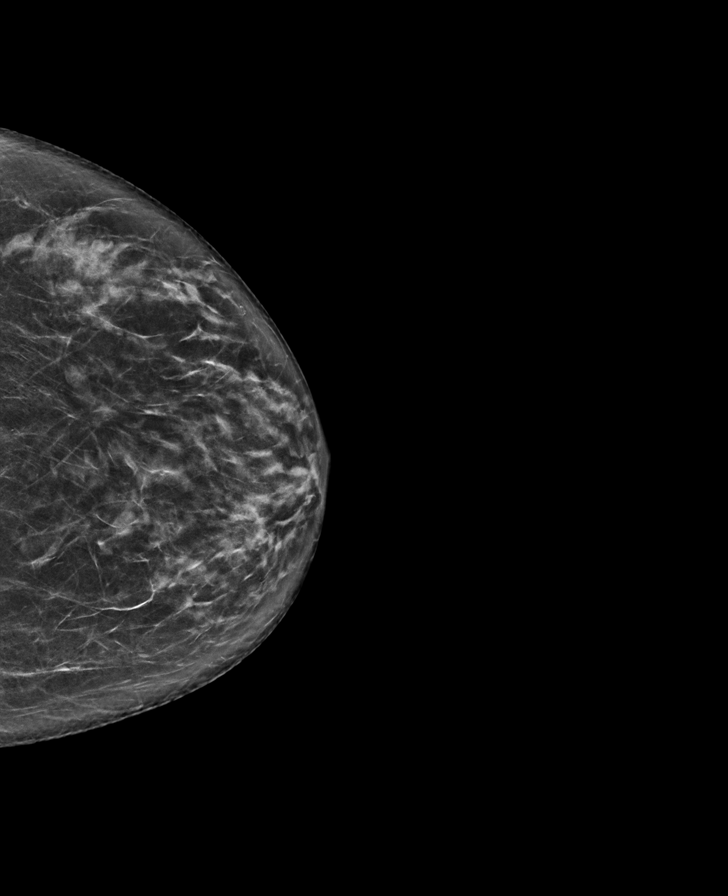

[L MLO synth-2D]
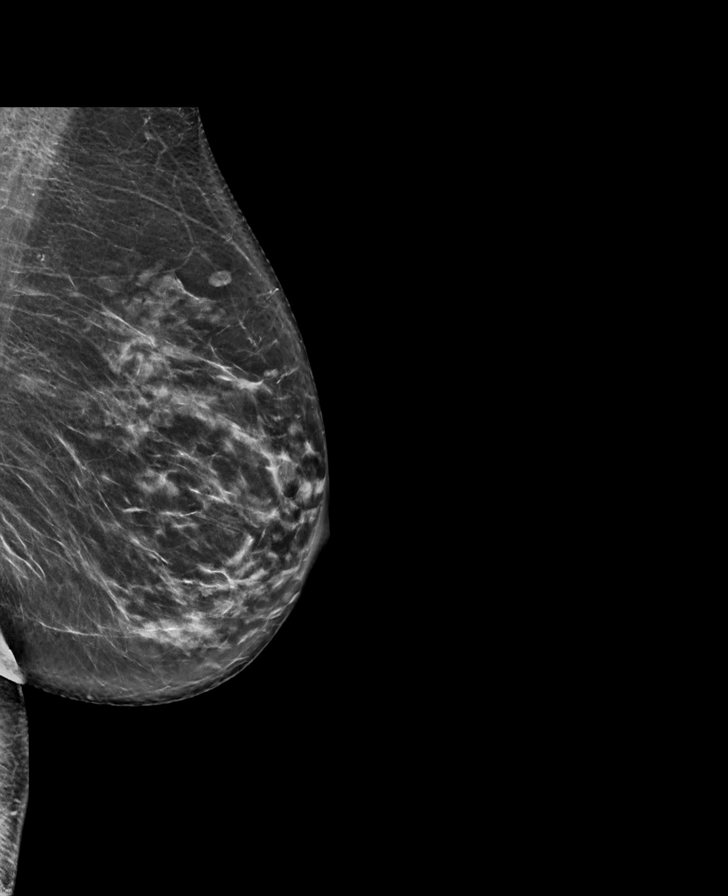

[R MLO synth-2D]
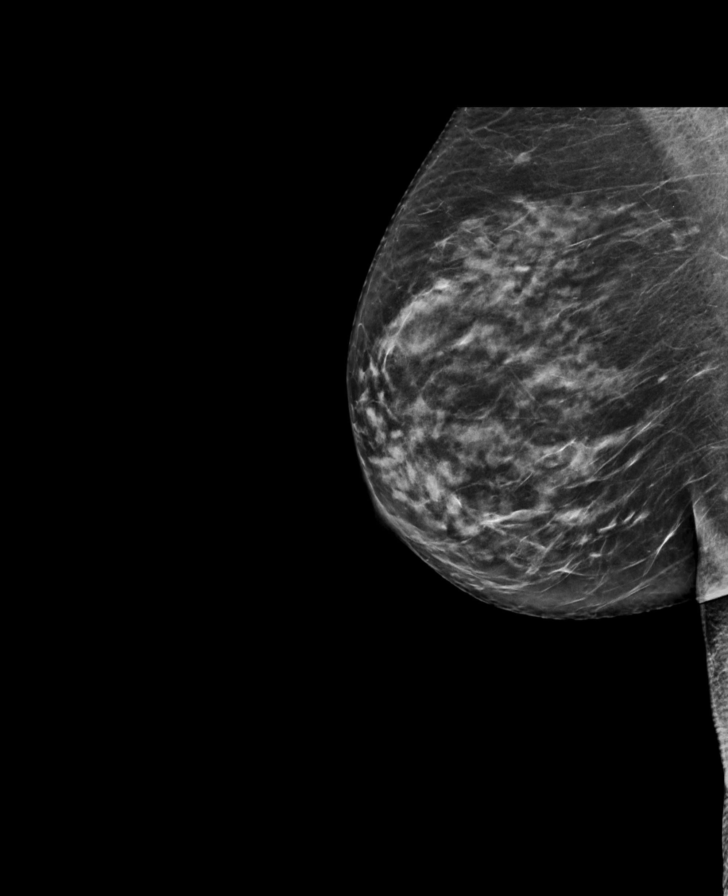

[R CC synth-2D]
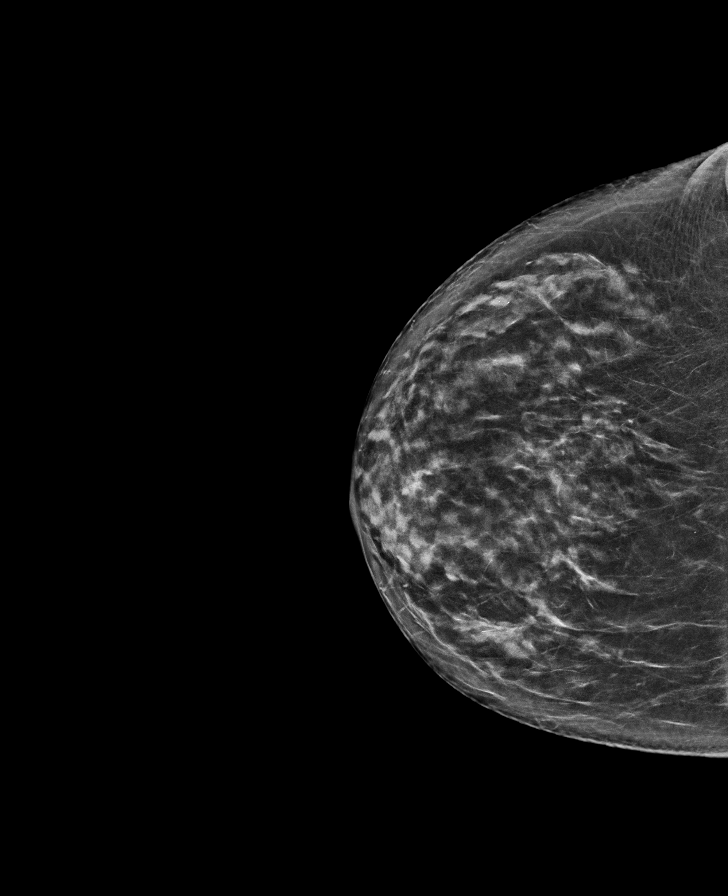

[L MLO tomo · tomo slice 38/75.0]
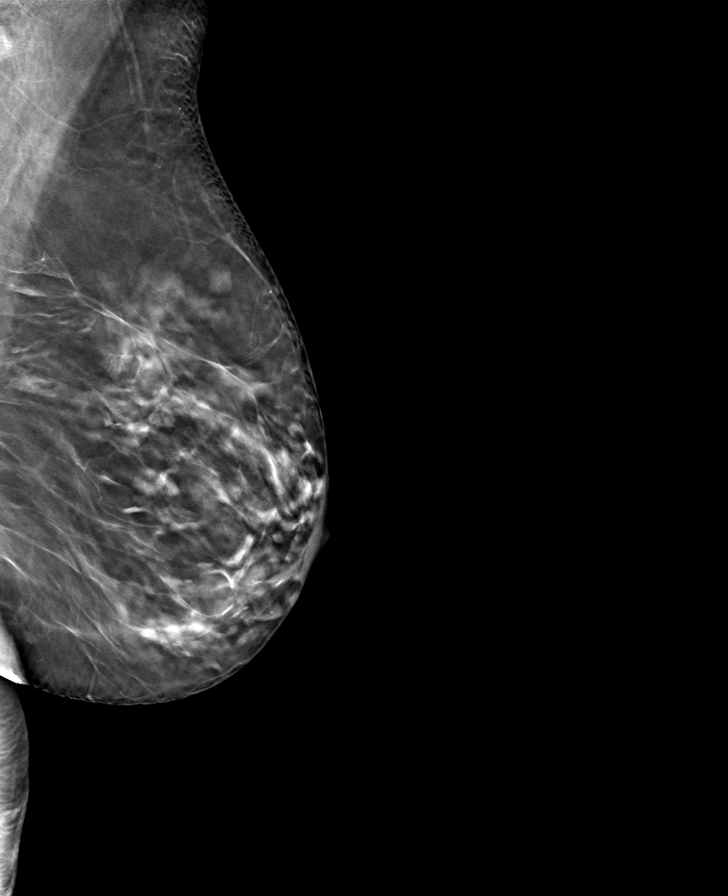

[R CC tomo · tomo slice 35/70.0]
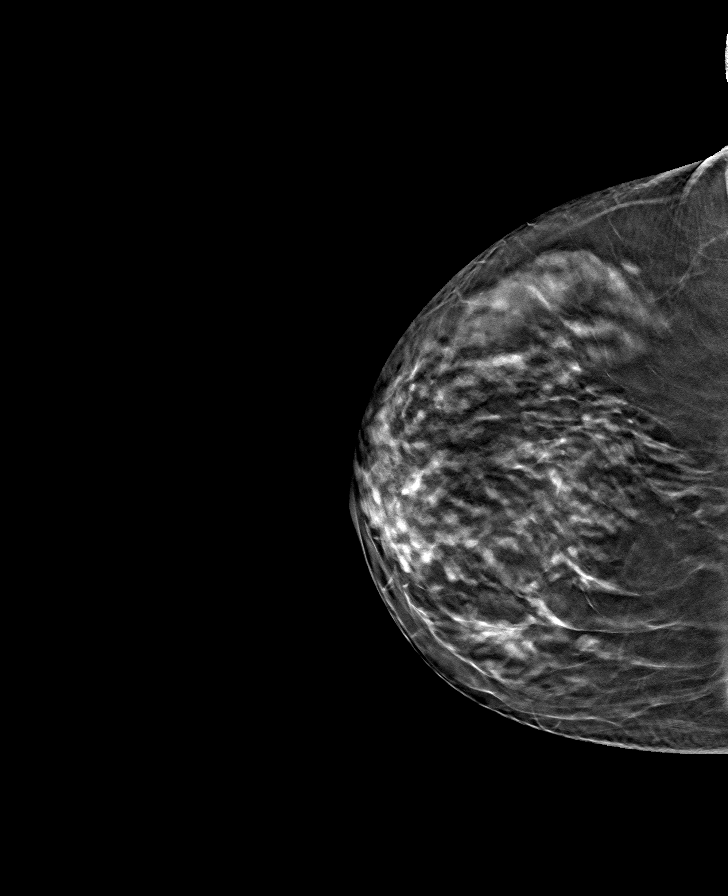

[R MLO tomo · tomo slice 38/75.0]
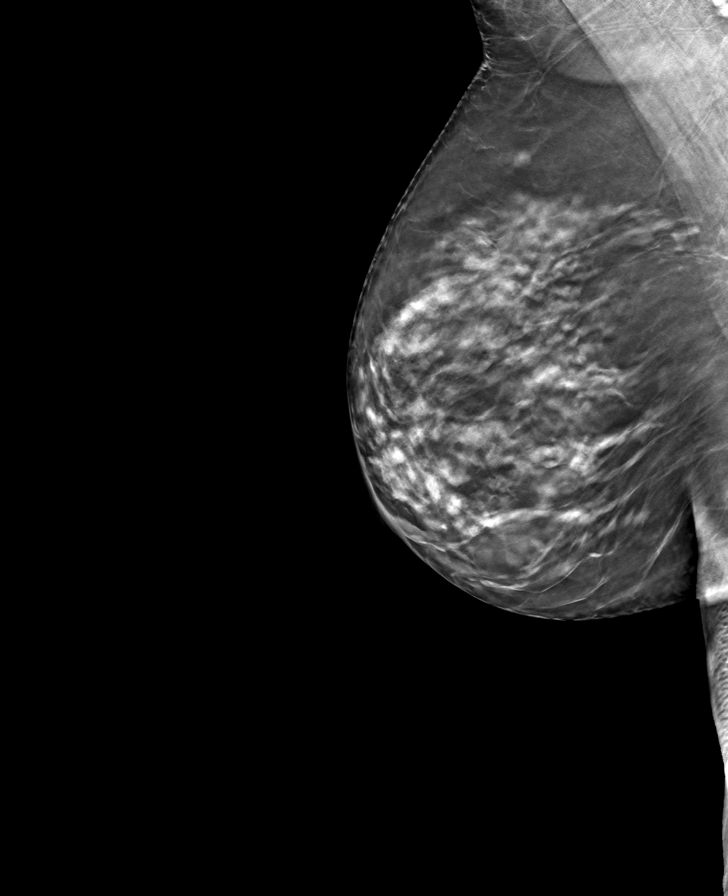

[L CC tomo · tomo slice 37/72.0]
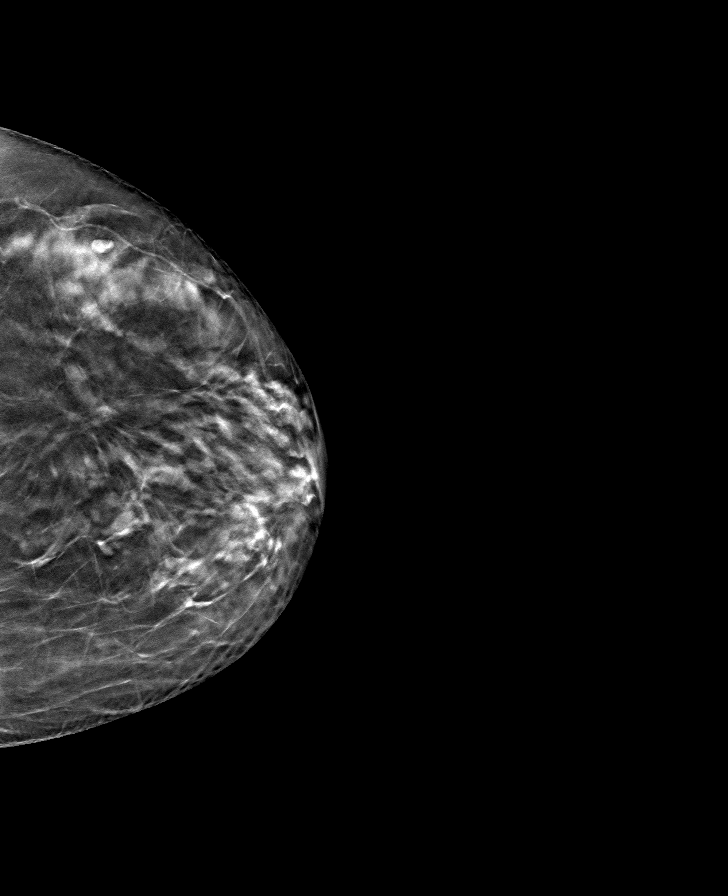

[8 of 24 positions shown; findings below may reference images not displayed]

ACR Breast Density Category c: The breast tissue is heterogeneously
dense, which may obscure small masses.
FINDINGS: There are no findings suspicious for malignancy.
IMPRESSION: No mammographic evidence of malignancy. A result letter of this
screening mammogram will be mailed directly to the patient.

RECOMMENDATION:
Screening mammogram in one year. (Code:Q3-W-BC3)

BI-RADS CATEGORY  1: Negative.

## 2022-05-08 ENCOUNTER — Other Ambulatory Visit: Payer: Self-pay | Admitting: Family Medicine

## 2022-05-24 ENCOUNTER — Other Ambulatory Visit: Payer: Self-pay | Admitting: Family Medicine

## 2022-07-26 ENCOUNTER — Telehealth: Payer: Self-pay | Admitting: Family Medicine

## 2022-07-26 DIAGNOSIS — Z0000 Encounter for general adult medical examination without abnormal findings: Secondary | ICD-10-CM

## 2022-07-26 DIAGNOSIS — F411 Generalized anxiety disorder: Secondary | ICD-10-CM

## 2022-07-26 DIAGNOSIS — E7800 Pure hypercholesterolemia, unspecified: Secondary | ICD-10-CM

## 2022-07-26 NOTE — Telephone Encounter (Signed)
-----   Message from Alvina Chou sent at 07/17/2022 12:17 PM EDT ----- Regarding: Lab orders for Monday, 6.17.24 Patient is scheduled for CPX labs, please order future labs, Thanks , Camelia Eng

## 2022-07-27 ENCOUNTER — Other Ambulatory Visit (INDEPENDENT_AMBULATORY_CARE_PROVIDER_SITE_OTHER): Payer: Medicare HMO

## 2022-07-27 DIAGNOSIS — Z0000 Encounter for general adult medical examination without abnormal findings: Secondary | ICD-10-CM

## 2022-07-27 DIAGNOSIS — E7800 Pure hypercholesterolemia, unspecified: Secondary | ICD-10-CM | POA: Diagnosis not present

## 2022-07-27 DIAGNOSIS — F411 Generalized anxiety disorder: Secondary | ICD-10-CM

## 2022-07-27 LAB — CBC WITH DIFFERENTIAL/PLATELET
Basophils Absolute: 0 10*3/uL (ref 0.0–0.1)
Basophils Relative: 0.6 % (ref 0.0–3.0)
Eosinophils Absolute: 0.3 10*3/uL (ref 0.0–0.7)
Eosinophils Relative: 6.4 % — ABNORMAL HIGH (ref 0.0–5.0)
HCT: 42.6 % (ref 36.0–46.0)
Hemoglobin: 14 g/dL (ref 12.0–15.0)
Lymphocytes Relative: 39.2 % (ref 12.0–46.0)
Lymphs Abs: 2 10*3/uL (ref 0.7–4.0)
MCHC: 32.8 g/dL (ref 30.0–36.0)
MCV: 91.7 fl (ref 78.0–100.0)
Monocytes Absolute: 0.4 10*3/uL (ref 0.1–1.0)
Monocytes Relative: 7.3 % (ref 3.0–12.0)
Neutro Abs: 2.3 10*3/uL (ref 1.4–7.7)
Neutrophils Relative %: 46.5 % (ref 43.0–77.0)
Platelets: 196 10*3/uL (ref 150.0–400.0)
RBC: 4.65 Mil/uL (ref 3.87–5.11)
RDW: 13.2 % (ref 11.5–15.5)
WBC: 5 10*3/uL (ref 4.0–10.5)

## 2022-07-27 LAB — COMPREHENSIVE METABOLIC PANEL
ALT: 18 U/L (ref 0–35)
AST: 20 U/L (ref 0–37)
Albumin: 4.3 g/dL (ref 3.5–5.2)
Alkaline Phosphatase: 72 U/L (ref 39–117)
BUN: 19 mg/dL (ref 6–23)
CO2: 28 mEq/L (ref 19–32)
Calcium: 9.8 mg/dL (ref 8.4–10.5)
Chloride: 104 mEq/L (ref 96–112)
Creatinine, Ser: 1.03 mg/dL (ref 0.40–1.20)
GFR: 53.19 mL/min — ABNORMAL LOW (ref >60.00)
Glucose, Bld: 108 mg/dL — ABNORMAL HIGH (ref 70–99)
Potassium: 4.6 mEq/L (ref 3.5–5.1)
Sodium: 141 mEq/L (ref 135–145)
Total Bilirubin: 0.5 mg/dL (ref 0.2–1.2)
Total Protein: 7.1 g/dL (ref 6.0–8.3)

## 2022-07-27 LAB — LIPID PANEL
Cholesterol: 166 mg/dL (ref 0–200)
HDL: 53.1 mg/dL (ref >39.00)
LDL Cholesterol: 83 mg/dL (ref 0–99)
NonHDL: 113.21
Total CHOL/HDL Ratio: 3
Triglycerides: 153 mg/dL — ABNORMAL HIGH (ref 0.0–149.0)
VLDL: 30.6 mg/dL (ref 0.0–40.0)

## 2022-07-27 LAB — TSH: TSH: 2.95 u[IU]/mL (ref 0.35–5.50)

## 2022-08-01 ENCOUNTER — Other Ambulatory Visit: Payer: Self-pay | Admitting: Family Medicine

## 2022-08-03 ENCOUNTER — Encounter: Payer: Self-pay | Admitting: Family Medicine

## 2022-08-03 ENCOUNTER — Ambulatory Visit (INDEPENDENT_AMBULATORY_CARE_PROVIDER_SITE_OTHER): Payer: Medicare HMO | Admitting: Family Medicine

## 2022-08-03 VITALS — BP 131/70 | HR 80 | Temp 97.8°F | Ht 64.75 in | Wt 158.1 lb

## 2022-08-03 DIAGNOSIS — Z0000 Encounter for general adult medical examination without abnormal findings: Secondary | ICD-10-CM | POA: Diagnosis not present

## 2022-08-03 DIAGNOSIS — M85859 Other specified disorders of bone density and structure, unspecified thigh: Secondary | ICD-10-CM

## 2022-08-03 DIAGNOSIS — F411 Generalized anxiety disorder: Secondary | ICD-10-CM

## 2022-08-03 DIAGNOSIS — R21 Rash and other nonspecific skin eruption: Secondary | ICD-10-CM | POA: Diagnosis not present

## 2022-08-03 DIAGNOSIS — E7800 Pure hypercholesterolemia, unspecified: Secondary | ICD-10-CM | POA: Diagnosis not present

## 2022-08-03 DIAGNOSIS — R7303 Prediabetes: Secondary | ICD-10-CM | POA: Insufficient documentation

## 2022-08-03 DIAGNOSIS — R7309 Other abnormal glucose: Secondary | ICD-10-CM | POA: Diagnosis not present

## 2022-08-03 MED ORDER — TRIAMCINOLONE ACETONIDE 0.1 % EX CREA: TOPICAL_CREAM | Freq: Two times a day (BID) | CUTANEOUS | 0 refills | Status: AC

## 2022-08-03 MED ORDER — BUSPIRONE HCL 15 MG PO TABS: ORAL_TABLET | ORAL | 3 refills | Status: DC

## 2022-08-03 MED ORDER — ATORVASTATIN CALCIUM 20 MG PO TABS: 20.0000 mg | ORAL_TABLET | Freq: Every day | ORAL | 3 refills | Status: DC

## 2022-08-03 NOTE — Assessment & Plan Note (Signed)
Dexa 01/2022 One fall (discussed fall prevention) No fracture  Continues mvi and boost- encouraged 2000 international units vit D 3  Encouraged strength building exercise

## 2022-08-03 NOTE — Assessment & Plan Note (Signed)
Disc goals for lipids and reasons to control them Rev last labs with pt Rev low sat fat diet in detail Well controlled with atorvastatin 20 mg daily and diet   

## 2022-08-03 NOTE — Progress Notes (Signed)
Subjective:    Patient ID: Tabitha Carlson, female    DOB: 29-Sep-1945, 77 y.o.   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   (needs refill ot triamcinolone cream 0.1%)  [facility]- Dr Nicholas Lose - first rash in years  Has never been treated / no biologic medicines Did biopsy in 2021   Has been outdoors/in pool  Gets a fair amount of sun   Derm Dr Clayborn Bigness derm    Immunization History  Administered Date(s) Administered   Fluad Quad(high Dose 65+) 10/13/2018, 11/22/2021   Influenza Split 11/01/2010, 10/23/2011   Influenza Whole 10/10/2009   Influenza, High Dose Seasonal PF 10/23/2014, 11/11/2016, 10/27/2017, 10/26/2019   Influenza, Seasonal, Injecte, Preservative Fre 11/14/2015   Influenza,inj,Quad PF,6+ Mos 10/26/2012, 10/20/2013   PFIZER Comirnaty(Gray Top)Covid-19 Tri-Sucrose Vaccine 11/11/2021   PFIZER(Purple Top)SARS-COV-2 Vaccination 03/26/2019, 04/18/2019, 12/07/2019   Pfizer Covid-19 Vaccine Bivalent Booster 56yrs & up 01/12/2021   Pneumococcal Conjugate-13 06/04/2015   Pneumococcal Polysaccharide-23 10/20/2013   Td 03/04/2000, 03/20/2010   Zoster Recombinat (Shingrix) 07/10/2021   Zoster, Live 12/11/2014    Health Maintenance Due  Topic Date Due   DTaP/Tdap/Td (3 - Tdap) 03/20/2020   Due for tetanus shot   Mammogram 01/2022  Self breast exam- no lumps   Gyn care/ pap  No issues or problems    Colon cancer screening -colonoscopy 12/2014  No polyps    Dexa 01/2022-osteopenia  Falls- one, stepped backwards into wheel [place]arrow  Fractures-none Supplements - taking mvi and boost   Exercise -pool, uses exercise bands    Mood    08/03/2022    8:56 AM 12/17/2021    3:07 PM 07/31/2021    8:37 AM 08/07/2020    2:41 PM 07/26/2020    9:33 AM  Depression screen PHQ 2/9  Decreased Interest 0 0 0 0 0  Down, Depressed, Hopeless 0 0 0 0 0  PHQ - 2 Score 0 0 0 0 0  Altered sleeping 3 0  0 0  Tired, decreased energy 2 0  0 0  Change in appetite 0 0  0 0  Feeling bad or failure about yourself  0 0  0 0  Trouble concentrating 0 0  0 0  Moving slowly or fidgety/restless 0 0  0 0  Suicidal thoughts 0 0  0 0  PHQ-9 Score 5 0  0 0  Difficult doing work/chores Somewhat difficult Not difficult at all  Not difficult at all Not difficult at all   Buspar for anxiety  Takes 1/4 xanax if needed for "attack" if she travels   Still some altered sleeping/ fatigue   BP Readings from Last 3 Encounters:  08/03/22 131/70  10/10/21 124/82  07/31/21 126/80   Pulse Readings from Last 3 Encounters:  08/03/22 80  10/10/21 89  07/31/21 80    Hyperlipidemia Lab Results  Component Value Date   CHOL 166 07/27/2022   CHOL 159 07/22/2021   CHOL 168 07/19/2020   Lab Results  Component  Value Date   HDL 53.10 07/27/2022   HDL 55.20 07/22/2021   HDL 54.60 07/19/2020   Lab Results  Component Value Date   LDLCALC 83 07/27/2022   LDLCALC 80 07/22/2021   LDLCALC 89 07/19/2020   Lab Results  Component Value Date   TRIG 153.0 (H) 07/27/2022   TRIG 115.0 07/22/2021   TRIG 125.0 07/19/2020   Lab Results  Component Value Date   CHOLHDL 3 07/27/2022   CHOLHDL 3 07/22/2021   CHOLHDL 3 07/19/2020   No results found for: "LDLDIRECT" Atorvastatin 20 mg daily  Other labs  Lab Results  Component Value Date   NA 141 07/27/2022   K 4.6 07/27/2022   CO2 28 07/27/2022   GLUCOSE 108 (H) 07/27/2022   BUN 19 07/27/2022   CREATININE 1.03 07/27/2022   CALCIUM 9.8 07/27/2022   GFR 53.19 (L) 07/27/2022   GFRNONAA 77.21 03/18/2009   Glucose was 108 fasting  Lab Results  Component  Value Date   ALT 18 07/27/2022   AST 20 07/27/2022   ALKPHOS 72 07/27/2022   BILITOT 0.5 07/27/2022    Lab Results  Component Value Date   WBC 5.0 07/27/2022   HGB 14.0 07/27/2022   HCT 42.6 07/27/2022   MCV 91.7 07/27/2022   PLT 196.0 07/27/2022   Lab Results  Component Value Date   TSH 2.95 07/27/2022    GM had diabetes     Patient Active Problem List   Diagnosis Date Noted   Elevated glucose level 08/03/2022   Rash and nonspecific skin eruption 03/22/2019   Medicare annual wellness visit, subsequent 06/29/2017   Estrogen deficiency 06/04/2015   Routine general medical examination at a health care facility 05/18/2015   Encounter for Medicare annual wellness exam 05/18/2014   Osteopenia 10/20/2013   Gastroparesis 01/02/2008   ALLERGIC RHINITIS 09/12/2007   COLONIC POLYPS 03/16/2007   GERD 03/16/2007   Hyperlipidemia 02/21/2007   GAD (generalized anxiety disorder) 02/21/2007   IBS 02/21/2007   DEGENERATIVE JOINT DISEASE 02/21/2007   Past Medical History:  Diagnosis Date   Allergic rhinitis    Allergy    See Chart   Anxiety    Arthritis    in back. Physical Therapy 2016   Blood donor    Cancer (HCC)    skin on back   Depression    DJD (degenerative joint disease)    Family history of ischemic heart disease    Gastroparesis    GERD (gastroesophageal reflux disease)    Hx of colonic polyps    Hypercholesteremia    Hyperlipidemia    IBS (irritable bowel syndrome)    Past Surgical History:  Procedure Laterality Date   ABDOMINAL HYSTERECTOMY  1985   TAH/BSO Age 20 DUB, pelvic pain   BREAST EXCISIONAL BIOPSY Left 1980, 1983   Benign Breast Biospy x 2   BREAST SURGERY  1980, 1983   CYST EXCISION  04/2011   Thigh   left breast biopsy     benign x 2   SKIN CANCER EXCISION     on back   SKIN SURGERY     removal of small skin on face   Social History   Tobacco Use   Smoking status: Never   Smokeless tobacco: Never  Vaping Use   Vaping Use:  Never used  Substance Use Topics   Alcohol use: Never   Drug use: Never   Family History  Problem Relation Age of Onset   Leukemia Mother  Hyperlipidemia Sister    Other Sister        2 cardiac episodes   Breast cancer Maternal Aunt    Breast cancer Maternal Aunt    Colon cancer Neg Hx    Allergies  Allergen Reactions   Aspirin     Unable to take a full aspirin--causes palpitations Tachycardia   Crab [Shellfish Allergy]     Rash from head to toe   Current Outpatient Medications on File Prior to Visit  Medication Sig Dispense Refill   acetaminophen (TYLENOL) 325 MG tablet Take 650 mg by mouth every 6 (six) hours as needed.     ALPRAZolam (XANAX) 0.5 MG tablet TAKE 1 TABLET BY MOUTH THREE TIMES A DAY AS NEEDED 90 tablet 0   Casanthranol-Docusate Sodium 30-100 MG CAPS Take 2 capsules by mouth daily.     Cholecalciferol (VITAMIN D) 2000 UNITS CAPS Take 1 capsule by mouth daily.     Ibuprofen (ADVIL PO) Take by mouth as needed.     Multiple Vitamins-Minerals (MULTIPLE VITAMINS/WOMENS PO) Take 1 tablet by mouth daily.     OVER THE COUNTER MEDICATION Fiber gummies     No current facility-administered medications on file prior to visit.    Review of Systems  Constitutional:  Positive for fatigue. Negative for activity change, appetite change, fever and unexpected weight change.  HENT:  Negative for congestion, ear pain, rhinorrhea, sinus pressure and sore throat.   Eyes:  Negative for pain, redness and visual disturbance.  Respiratory:  Negative for cough, shortness of breath and wheezing.   Cardiovascular:  Negative for chest pain and palpitations.  Gastrointestinal:  Negative for abdominal pain, blood in stool, constipation and diarrhea.  Endocrine: Negative for polydipsia and polyuria.  Genitourinary:  Negative for dysuria, frequency and urgency.  Musculoskeletal:  Positive for arthralgias. Negative for back pain and myalgias.  Skin:  Positive for rash. Negative for pallor.   Allergic/Immunologic: Negative for environmental allergies.  Neurological:  Negative for dizziness, syncope and headaches.  Hematological:  Negative for adenopathy. Does not bruise/bleed easily.  Psychiatric/Behavioral:  Negative for decreased concentration and dysphoric mood. The patient is not nervous/anxious.        Objective:   Physical Exam Constitutional:      General: She is not in acute distress.    Appearance: Normal appearance. She is well-developed and normal weight. She is not ill-appearing or diaphoretic.  HENT:     Head: Normocephalic and atraumatic.     Right Ear: Tympanic membrane, ear canal and external ear normal.     Left Ear: Tympanic membrane, ear canal and external ear normal.     Nose: Nose normal. No congestion.     Mouth/Throat:     Mouth: Mucous membranes are moist.     Pharynx: Oropharynx is clear. No posterior oropharyngeal erythema.  Eyes:     General: No scleral icterus.    Extraocular Movements: Extraocular movements intact.     Conjunctiva/sclera: Conjunctivae normal.     Pupils: Pupils are equal, round, and reactive to light.  Neck:     Thyroid: No thyromegaly.     Vascular: No carotid bruit or JVD.  Cardiovascular:     Rate and Rhythm: Normal rate and regular rhythm.     Pulses: Normal pulses.     Heart sounds: Normal heart sounds.     No gallop.  Pulmonary:     Effort: Pulmonary effort is normal. No respiratory distress.     Breath sounds: Normal breath sounds.  No wheezing.     Comments: Good air exch Chest:     Chest wall: No tenderness.  Abdominal:     General: Bowel sounds are normal. There is no distension or abdominal bruit.     Palpations: Abdomen is soft. There is no mass.     Tenderness: There is no abdominal tenderness.     Hernia: No hernia is present.  Genitourinary:    Comments: Breast exam: No mass, nodules, thickening, tenderness, bulging, retraction, inflamation, nipple discharge or skin changes noted.  No axillary or  clavicular LA.     Musculoskeletal:        General: No tenderness. Normal range of motion.     Cervical back: Normal range of motion and neck supple. No rigidity. No muscular tenderness.     Right lower leg: No edema.     Left lower leg: No edema.     Comments: No kyphosis   Lymphadenopathy:     Cervical: No cervical adenopathy.  Skin:    General: Skin is warm and dry.     Coloration: Skin is not pale.     Findings: No erythema or rash.     Comments: Solar lentigines diffusely  Right ankle/foot - area of erythema and scale/ patchy and slightly plaque like   Neurological:     Mental Status: She is alert. Mental status is at baseline.     Cranial Nerves: No cranial nerve deficit.     Motor: No abnormal muscle tone.     Coordination: Coordination normal.     Gait: Gait normal.     Deep Tendon Reflexes: Reflexes are normal and symmetric. Reflexes normal.  Psychiatric:        Mood and Affect: Mood normal.        Cognition and Memory: Cognition and memory normal.           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       Osteopenia    Dexa 01/2022 One fall (discussed fall prevention) No fracture  Continues mvi and boost- encouraged 2000 international units vit D 3  Encouraged strength building exercise         Other   Routine general medical examination at a health care facility - Primary    Reviewed health habits including diet and exercise and skin cancer prevention Reviewed appropriate screening tests for age  Also reviewed health mt list, fam hx and immunization status , as well as social and family history   See HPI Labs reviewed and ordered Pt will look into getting Td at  pharmacy Mammogram utd 01/2022 Colonoscopy neg 12/2014-no recall due to age Dexa utd 01/2022   one fall/no fracture Discussed fall preventoin and also opt for strength building exercise        Hyperlipidemia    Disc goals for lipids and reasons to control them Rev last labs with pt Rev low sat fat diet in detail Well controlled with atorvastatin 20 mg daily and diet        Relevant Medications   atorvastatin (LIPITOR) 20 MG tablet   GAD (generalized anxiety disorder)  Continues buspar Infrequent xanax  Encouraged good self care       Relevant Medications   busPIRone (BUSPAR) 15 MG tablet   Elevated glucose level    Glucose of 108  Good diet and exercise habits Will check A1c next time Encouraged low glycemic diet and exercise

## 2022-08-03 NOTE — Assessment & Plan Note (Signed)
Continues buspar Infrequent xanax  Encouraged good self care

## 2022-08-03 NOTE — Assessment & Plan Note (Signed)
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

## 2022-08-03 NOTE — Patient Instructions (Addendum)
Get back in with dermatology about ? Lupus / rash   Look into tetanus at pharmacy  Make sure you get at least 2000 international units of vitamin D3 daily  Add some strength training to your routine, this is important for bone and brain health and can reduce your risk of falls and help your body use insulin properly and regulate weight  Light weights, exercise bands , and internet videos are a good way to start  Yoga (chair or regular), machines , floor exercises or a gym with machines are also good options   The next time we do labs I will check A1c (a 3 month blood sugar check)  disc imp of low glycemic diet and wt loss to prevent DM2   Take care of yourself  Take breaks from the sun  Wear sun protection

## 2022-08-03 NOTE — Assessment & Plan Note (Signed)
Glucose of 108  Good diet and exercise habits Will check A1c next time Encouraged low glycemic diet and exercise

## 2022-08-03 NOTE — Assessment & Plan Note (Signed)
Reviewed health habits including diet and exercise and skin cancer prevention Reviewed appropriate screening tests for age  Also reviewed health mt list, fam hx and immunization status , as well as social and family history   See HPI Labs reviewed and ordered Pt will look into getting Td at pharmacy Mammogram utd 01/2022 Colonoscopy neg 12/2014-no recall due to age Dexa utd 01/2022   one fall/no fracture Discussed fall preventoin and also opt for strength building exercise

## 2022-12-21 ENCOUNTER — Ambulatory Visit (INDEPENDENT_AMBULATORY_CARE_PROVIDER_SITE_OTHER): Payer: Medicare HMO

## 2022-12-21 VITALS — Ht 65.5 in | Wt 158.0 lb

## 2022-12-21 DIAGNOSIS — Z0000 Encounter for general adult medical examination without abnormal findings: Secondary | ICD-10-CM | POA: Diagnosis not present

## 2022-12-21 NOTE — Progress Notes (Signed)
Subjective:   Tabitha Carlson is a 77 y.o. female who presents for Medicare Annual (Subsequent) preventive examination.  Visit Complete: Virtual I connected with  Tabitha Carlson on 12/21/22 by a audio enabled telemedicine application and verified that I am speaking with the correct person using two identifiers.  Patient Location: Home  Provider Location: Home Office  I discussed the limitations of evaluation and management by telemedicine. The patient expressed understanding and agreed to proceed.  Vital Signs: Because this visit was a virtual/telehealth visit, some criteria may be missing or patient reported. Any vitals not documented were not able to be obtained and vitals that have been documented are patient reported.  Patient Medicare AWV questionnaire was completed by the patient on (not done); I have confirmed that all information answered by patient is correct and no changes since this date. Cardiac Risk Factors include: advanced age (>30men, >63 women);dyslipidemia    Objective:    Today's Vitals   12/21/22 1305  Weight: 158 lb (71.7 kg)  Height: 5' 5.5" (1.664 m)   Body mass index is 25.89 kg/m.     12/21/2022    1:20 PM 12/17/2021    3:08 PM 08/07/2020    2:39 PM 08/12/2019    1:04 PM 07/20/2019   10:49 AM 07/19/2018    8:55 AM 06/29/2017    9:11 AM  Advanced Directives  Does Patient Have a Medical Advance Directive? No No No No No No No  Does patient want to make changes to medical advance directive?   Yes (MAU/Ambulatory/Procedural Areas - Information given)    Yes (MAU/Ambulatory/Procedural Areas - Information given)  Would patient like information on creating a medical advance directive?  No - Patient declined  No - Patient declined No - Patient declined No - Patient declined     Current Medications (verified) Outpatient Encounter Medications as of 12/21/2022  Medication Sig   acetaminophen (TYLENOL) 325 MG tablet Take 650 mg by mouth every 6 (six) hours as needed.    ALPRAZolam (XANAX) 0.5 MG tablet TAKE 1 TABLET BY MOUTH THREE TIMES A DAY AS NEEDED   atorvastatin (LIPITOR) 20 MG tablet Take 1 tablet (20 mg total) by mouth daily.   busPIRone (BUSPAR) 15 MG tablet TAKE 1/2 TABLETS (7.5 MG TOTAL) BY MOUTH 2 (TWO) TIMES DAILY.   Casanthranol-Docusate Sodium 30-100 MG CAPS Take 2 capsules by mouth daily.   Cholecalciferol (VITAMIN D) 2000 UNITS CAPS Take 1 capsule by mouth daily.   Ibuprofen (ADVIL PO) Take by mouth as needed.   Multiple Vitamins-Minerals (MULTIPLE VITAMINS/WOMENS PO) Take 1 tablet by mouth daily.   OVER THE COUNTER MEDICATION Fiber gummies   triamcinolone cream (KENALOG) 0.1 % Apply topically 2 (two) times daily. To affected area   No facility-administered encounter medications on file as of 12/21/2022.    Allergies (verified) Aspirin and Crab [shellfish allergy]   History: Past Medical History:  Diagnosis Date   Allergic rhinitis    Allergy    See Chart   Anxiety    Arthritis    in back. Physical Therapy 2016   Blood donor    Cancer (HCC)    skin on back   Depression    DJD (degenerative joint disease)    Family history of ischemic heart disease    Gastroparesis    GERD (gastroesophageal reflux disease)    Hx of colonic polyps    Hypercholesteremia    Hyperlipidemia    IBS (irritable bowel syndrome)    Past  Surgical History:  Procedure Laterality Date   ABDOMINAL HYSTERECTOMY  1985   TAH/BSO Age 89 DUB, pelvic pain   BREAST EXCISIONAL BIOPSY Left 1980, 1983   Benign Breast Biospy x 2   BREAST SURGERY  1980, 1983   CYST EXCISION  04/2011   Thigh   left breast biopsy     benign x 2   SKIN CANCER EXCISION     on back   SKIN SURGERY     removal of small skin on face   Family History  Problem Relation Age of Onset   Leukemia Mother    Hyperlipidemia Sister    Other Sister        2 cardiac episodes   Breast cancer Maternal Aunt    Breast cancer Maternal Aunt    Colon cancer Neg Hx    Social History    Socioeconomic History   Marital status: Married    Spouse name: Automotive engineer   Number of children: 2   Years of education: Not on file   Highest education level: Not on file  Occupational History   Occupation: housewife  Tobacco Use   Smoking status: Never   Smokeless tobacco: Never  Vaping Use   Vaping status: Never Used  Substance and Sexual Activity   Alcohol use: Never   Drug use: Never   Sexual activity: Yes    Partners: Male    Birth control/protection: Surgical    Comment: Hysterectomy  Other Topics Concern   Not on file  Social History Narrative   Not on file   Social Determinants of Health   Financial Resource Strain: Low Risk  (12/21/2022)   Overall Financial Resource Strain (CARDIA)    Difficulty of Paying Living Expenses: Not hard at all  Food Insecurity: No Food Insecurity (12/21/2022)   Hunger Vital Sign    Worried About Running Out of Food in the Last Year: Never true    Ran Out of Food in the Last Year: Never true  Transportation Needs: No Transportation Needs (12/21/2022)   PRAPARE - Administrator, Civil Service (Medical): No    Lack of Transportation (Non-Medical): No  Physical Activity: Sufficiently Active (12/21/2022)   Exercise Vital Sign    Days of Exercise per Week: 7 days    Minutes of Exercise per Session: 40 min  Stress: No Stress Concern Present (12/21/2022)   [employer] of Occupational Health - Occupational Stress Questionnaire    Feeling of Stress : Not at all  Social Connections: Moderately Integrated (12/21/2022)   Social Connection and Isolation Panel [NHANES]    Frequency of Communication with Friends and Family: More than three times a week    Frequency of Social Gatherings with Friends and Family: More than three times a week    Attends Religious Services: More than 4 times per year    Active Member of [employer] or Organizations: No    Attends Engineer, structural: Never    Marital Status: Married     Tobacco Counseling Counseling given: Not Answered  Clinical Intake:  Pre-visit preparation completed: No  Pain : No/denies pain   BMI - recorded: 25.89 Nutritional Status: BMI 25 -29 Overweight Nutritional Risks: None Diabetes: No  How often do you need to have someone help you when you read instructions, pamphlets, or other written materials from your doctor or pharmacy?: 1 - Never  Interpreter Needed?: No  Comments: lives with husband Information entered by :: B.Leland Raver,LPN   Activities of  Daily Living    12/21/2022    1:21 PM  In your present state of health, do you have any difficulty performing the following activities:  Hearing? 0  Vision? 0  Difficulty concentrating or making decisions? 0  Walking or climbing stairs? 0  Dressing or bathing? 0  Doing errands, shopping? 0  Preparing Food and eating ? N  Using the Toilet? N  In the past six months, have you accidently leaked urine? N  Do you have problems with loss of bowel control? N  Managing your Medications? N  Managing your Finances? N  Housekeeping or managing your Housekeeping? N    Patient Care Team: Tower, Audrie Gallus, MD as PCP - General (Family Medicine) Loletha Carrow, MD as Consulting Physician (Ophthalmology) Verner Chol, CNM as Referring Physician (Certified Nurse Midwife)  Indicate any recent Medical Services you may have received from other than [facility] providers in the past year (date may be approximate).     Assessment:   This is a routine wellness examination for [place].  Hearing/Vision screen Hearing Screening - Comments:: Pt says her hearing is good Vision Screening - Comments:: Pt says she wears contacts and vision is good Building control surveyor   Goals Addressed             This Visit's Progress    DIET - EAT MORE FRUITS AND VEGETABLES   On track    Increase physical activity   On track    Starting 07/19/2018, I will continue to walk at 60 minutes daily.       Patient Stated   On track    07/20/2019, I will continue to do leg exercises everyday for 30 minutes.     Patient Stated   On track    08/07/2020, I will continue to do weights and step aerobics daily for 1 hour.       Depression Screen    12/21/2022    1:16 PM 08/03/2022    8:56 AM 12/17/2021    3:07 PM 07/31/2021    8:37 AM 08/07/2020    2:41 PM 07/26/2020    9:33 AM 07/20/2019   10:50 AM  PHQ 2/9 Scores  PHQ - 2 Score 0 0 0 0 0 0 0  PHQ- 9 Score  5 0  0 0 0    Fall Risk    12/21/2022    1:07 PM 08/03/2022    8:56 AM 12/17/2021    3:09 PM 07/31/2021    8:37 AM 08/07/2020    2:41 PM  Fall Risk   Falls in the past year? 0 0 0 0 0  Number falls in past yr: 0 0 0  0  Injury with Fall? 0 0 0  0  Risk for fall due to : No Fall Risks No Fall Risks No Fall Risks  Medication side effect  Follow up Falls prevention discussed;Education provided Falls evaluation completed Falls prevention discussed;Falls evaluation completed Falls evaluation completed Falls evaluation completed;Falls prevention discussed    MEDICARE RISK AT HOME: Medicare Risk at Home Any stairs in or around the home?: No If so, are there any without handrails?: No Home free of loose throw rugs in walkways, pet beds, electrical cords, etc?: Yes Adequate lighting in your home to reduce risk of falls?: Yes Life alert?: No Use of a cane, walker or w/c?: No Grab bars in the bathroom?: No Shower chair or bench in shower?: Yes Elevated toilet seat or a handicapped toilet?: Yes  TIMED UP  AND GO:  Was the test performed?  No    Cognitive Function:    08/07/2020    2:47 PM 07/20/2019   10:56 AM 07/19/2018    8:55 AM 06/29/2017    9:22 AM 06/18/2016   12:38 PM  MMSE - Mini Mental State Exam  Orientation to time 5 5 5 5 5   Orientation to Place 5 5 5 5 5   Registration 3 3 3 3 3   Attention/ Calculation 5 5 0 0 0  Recall 3 3 3 3 3   Language- name 2 objects   0 0 0  Language- repeat 1 1 1 1 1   Language- follow 3 step  command   0 3 3  Language- read & follow direction   0 0 0  Write a sentence   0 0 0  Copy design   0 0 0  Total score   17 20 20         12/21/2022    1:27 PM 12/17/2021    3:10 PM  6CIT Screen  What Year? 0 points 0 points  What month? 0 points 0 points  What time? 0 points 0 points  Count back from 20 0 points 0 points  Months in reverse 0 points 0 points  Repeat phrase 0 points 0 points  Total Score 0 points 0 points    Immunizations Immunization History  Administered Date(s) Administered   Fluad Quad(high Dose 65+) 10/13/2018, 11/22/2021   Influenza Split 11/01/2010, 10/23/2011   Influenza Whole 10/10/2009   Influenza, High Dose Seasonal PF 10/23/2014, 11/11/2016, 10/27/2017, 10/26/2019, 10/10/2022   Influenza, Seasonal, Injecte, Preservative Fre 11/14/2015   Influenza,inj,Quad PF,6+ Mos 10/26/2012, 10/20/2013   PFIZER Comirnaty(Gray Top)Covid-19 Tri-Sucrose Vaccine 11/11/2021   PFIZER(Purple Top)SARS-COV-2 Vaccination 03/26/2019, 04/18/2019, 12/07/2019   Pfizer Covid-19 Vaccine Bivalent Booster 20yrs & up 01/12/2021   Pfizer(Comirnaty)Fall Seasonal Vaccine 12 years and older 11/09/2022   Pneumococcal Conjugate-13 06/04/2015   Pneumococcal Polysaccharide-23 10/20/2013   Td 03/04/2000, 03/20/2010   Zoster Recombinant(Shingrix) 07/10/2021   Zoster, Live 12/11/2014    TDAP status: Up to date  Flu Vaccine status: Up to date  Pneumococcal vaccine status: Up to date  Covid-19 vaccine status: Completed vaccines  Qualifies for Shingles Vaccine? Yes   Zostavax completed Yes  #1 had reaction to this:does not want #2 Shingrix Completed?: No.    Education has been provided regarding the importance of this vaccine. Patient has been advised to call insurance company to determine out of pocket expense if they have not yet received this vaccine. Advised may also receive vaccine at local pharmacy or Health Dept. Verbalized acceptance and understanding.  Screening Tests Health  Maintenance  Topic Date Due   DTaP/Tdap/Td (3 - Tdap) 03/20/2020   MAMMOGRAM  01/14/2023   COVID-19 Vaccine (7 - 2023-24 season) 03/11/2023   Medicare Annual Wellness (AWV)  12/21/2023   Colonoscopy  12/18/2024   Pneumonia Vaccine 38+ Years old  Completed   INFLUENZA VACCINE  Completed   DEXA SCAN  Completed   Hepatitis C Screening  Completed   HPV VACCINES  Aged Out   Zoster Vaccines- Shingrix  Discontinued    Health Maintenance  Health Maintenance Due  Topic Date Due   DTaP/Tdap/Td (3 - Tdap) 03/20/2020    Colorectal cancer screening: No longer required.   Mammogram status: No longer required due to age.  Bone Density status: Completed 01/13/22. Results reflect: Bone density results: OSTEOPENIA. Repeat every 3-5 years.  Lung Cancer Screening: (Low Dose CT Chest  recommended if Age 53-80 years, 20 pack-year currently smoking OR have quit w/in 15years.) does not qualify.   Lung Cancer Screening Referral: no  Additional Screening:  Hepatitis C Screening: does not qualify; Completed 06/18/2015  Vision Screening: Recommended annual ophthalmology exams for early detection of glaucoma and other disorders of the eye. Is the patient up to date with their annual eye exam?  Yes  Who is the provider or what is the name of the office in which the patient attends annual eye exams? [facility] If pt is not established with a provider, would they like to be referred to a provider to establish care? No .   Dental Screening: Recommended annual dental exams for proper oral hygiene  Diabetic Foot Exam: n/a  Community Resource Referral / Chronic Care Management: CRR required this visit?  No   CCM required this visit?  No    Plan:     I have personally reviewed and noted the following in the patient's chart:   Medical and social history Use of alcohol, tobacco or illicit drugs  Current medications and supplements including opioid prescriptions. Patient is not currently  taking opioid prescriptions. Functional ability and status Nutritional status Physical activity Advanced directives List of other physicians Hospitalizations, surgeries, and ER visits in previous 12 months Vitals Screenings to include cognitive, depression, and falls Referrals and appointments  In addition, I have reviewed and discussed with patient certain preventive protocols, quality metrics, and best practice recommendations. A written personalized care plan for preventive services as well as general preventive health recommendations were provided to patient.   Sue Lush, LPN   [id number]   After Visit Summary: (MyChart) Due to this being a telephonic visit, the after visit summary with patients personalized plan was offered to patient via MyChart   Nurse Notes: The patient states she is doing well and has no concerns or questions at this time.

## 2022-12-21 NOTE — Patient Instructions (Signed)
Tabitha Carlson , Thank you for taking time to come for your Medicare Wellness Visit. I appreciate your ongoing commitment to your health goals. Please review the following plan we discussed and let me know if I can assist you in the future.   Referrals/Orders/Follow-Ups/Clinician Recommendations: none  This is a list of the screening recommended for you and due dates:  Health Maintenance  Topic Date Due   DTaP/Tdap/Td vaccine (3 - Tdap) 03/20/2020   Mammogram  01/14/2023   COVID-19 Vaccine (7 - 2023-24 season) 03/11/2023   Medicare Annual Wellness Visit  12/21/2023   Colon Cancer Screening  12/18/2024   Pneumonia Vaccine  Completed   Flu Shot  Completed   DEXA scan (bone density measurement)  Completed   Hepatitis C Screening  Completed   HPV Vaccine  Aged Out   Zoster (Shingles) Vaccine  Discontinued    Advanced directives: (Declined) Advance directive discussed with you today. Even though you declined this today, please call our office should you change your mind, and we can give you the proper paperwork for you to fill out.  Next Medicare Annual Wellness Visit scheduled for next year: Yes 12/22/2023 @ 1:40pm telephone

## 2022-12-28 DIAGNOSIS — H25813 Combined forms of age-related cataract, bilateral: Secondary | ICD-10-CM | POA: Diagnosis not present

## 2022-12-28 DIAGNOSIS — H5213 Myopia, bilateral: Secondary | ICD-10-CM | POA: Diagnosis not present

## 2022-12-28 DIAGNOSIS — H52203 Unspecified astigmatism, bilateral: Secondary | ICD-10-CM | POA: Diagnosis not present

## 2023-01-14 ENCOUNTER — Other Ambulatory Visit: Payer: Self-pay

## 2023-01-14 ENCOUNTER — Encounter (HOSPITAL_COMMUNITY): Payer: Self-pay | Admitting: Emergency Medicine

## 2023-01-14 ENCOUNTER — Ambulatory Visit (HOSPITAL_COMMUNITY)
Admission: EM | Admit: 2023-01-14 | Discharge: 2023-01-14 | Disposition: A | Discharge status: Home / Self Care | Payer: Medicare HMO | Attending: Family Medicine | Admitting: Family Medicine

## 2023-01-14 ENCOUNTER — Emergency Department (HOSPITAL_COMMUNITY)
Admission: EM | Admit: 2023-01-14 | Discharge: 2023-01-15 | Discharge status: Against Medical Advice | Payer: Medicare HMO | Attending: Emergency Medicine | Admitting: Emergency Medicine

## 2023-01-14 ENCOUNTER — Telehealth: Payer: Self-pay | Admitting: Family Medicine

## 2023-01-14 DIAGNOSIS — R202 Paresthesia of skin: Secondary | ICD-10-CM | POA: Insufficient documentation

## 2023-01-14 DIAGNOSIS — H538 Other visual disturbances: Secondary | ICD-10-CM

## 2023-01-14 DIAGNOSIS — Z5321 Procedure and treatment not carried out due to patient leaving prior to being seen by health care provider: Secondary | ICD-10-CM | POA: Diagnosis not present

## 2023-01-14 DIAGNOSIS — R200 Anesthesia of skin: Secondary | ICD-10-CM | POA: Diagnosis not present

## 2023-01-14 DIAGNOSIS — R110 Nausea: Secondary | ICD-10-CM

## 2023-01-14 LAB — BASIC METABOLIC PANEL
Anion gap: 10 (ref 5–15)
BUN: 23 mg/dL (ref 8–23)
CO2: 24 mmol/L (ref 22–32)
Calcium: 9.8 mg/dL (ref 8.9–10.3)
Chloride: 105 mmol/L (ref 98–111)
Creatinine, Ser: 0.98 mg/dL (ref 0.44–1.00)
GFR, Estimated: >60 mL/min (ref >60)
Glucose, Bld: 128 mg/dL — ABNORMAL HIGH (ref 70–99)
Potassium: 4.9 mmol/L (ref 3.5–5.1)
Sodium: 139 mmol/L (ref 135–145)

## 2023-01-14 LAB — CBC WITH DIFFERENTIAL/PLATELET
Abs Immature Granulocytes: 0.02 10*3/uL (ref 0.00–0.07)
Basophils Absolute: 0 10*3/uL (ref 0.0–0.1)
Basophils Relative: 1 %
Eosinophils Absolute: 0.1 10*3/uL (ref 0.0–0.5)
Eosinophils Relative: 1 %
HCT: 44 % (ref 36.0–46.0)
Hemoglobin: 14.1 g/dL (ref 12.0–15.0)
Immature Granulocytes: 0 %
Lymphocytes Relative: 20 %
Lymphs Abs: 1.3 10*3/uL (ref 0.7–4.0)
MCH: 29.6 pg (ref 26.0–34.0)
MCHC: 32 g/dL (ref 30.0–36.0)
MCV: 92.2 fL (ref 80.0–100.0)
Monocytes Absolute: 0.4 10*3/uL (ref 0.1–1.0)
Monocytes Relative: 5 %
Neutro Abs: 4.9 10*3/uL (ref 1.7–7.7)
Neutrophils Relative %: 73 %
Platelets: 204 10*3/uL (ref 150–400)
RBC: 4.77 MIL/uL (ref 3.87–5.11)
RDW: 12.1 % (ref 11.5–15.5)
WBC: 6.6 10*3/uL (ref 4.0–10.5)
nRBC: 0 % (ref 0.0–0.2)

## 2023-01-14 LAB — POCT CBG MONITORING: CCT (OD): 123

## 2023-01-14 NOTE — ED Triage Notes (Addendum)
Pt ambulatory to triage.  sent by UC for eval of an episode earlier this afternoon, around 1pm where she was riding in a car and had blurry vision and numbness and tingling to both hands and feet with nausea.  Has had nausea the past 2 mornings as well. .  Symptoms resolved about 40 min later.

## 2023-01-14 NOTE — ED Provider Triage Note (Signed)
Emergency Medicine Provider Triage Evaluation Note  Tabitha Carlson , a 77 y.o. female  was evaluated in triage.  Pt complains of an episode of blurred vision and tingliness to her hands and feet while driving.  Symptoms lasted 40 minutes and completely resolved spontaneously.  She denies chest pain, shortness of breath, weakness, headache, fever, chills.  Review of Systems  Positive:  Negative: See above   Physical Exam  BP (!) 168/91 (BP Location: Right Arm)   Pulse (!) 109   Temp 98.7 F (37.1 C)   Resp 16   SpO2 97%  Gen:   Awake, no distress   Resp:  Normal effort  MSK:   Moves extremities without difficulty  Other:  Cranial nerves II through XII are intact.  5/5 strength to the upper and lower extremities.  Normal sensation to the upper and lower extremities.  No dysmetria with finger-nose.  Medical Decision Making  Medically screening exam initiated at 5:39 PM.  Appropriate orders placed.  MAI KOTTLER was informed that the remainder of the evaluation will be completed by another provider, this initial triage assessment does not replace that evaluation, and the importance of remaining in the ED until their evaluation is complete.     Honor Loh Maitland, [place] 01/14/23 1740

## 2023-01-14 NOTE — Telephone Encounter (Signed)
I spoke with pt about 1:15  pt suddenly had blurred and wavy visio, tingling in feet and hands on both sides.pt was nauseated but did not vomit. Pt was also lightheaded. Pt has never felt like this before. No CP, SOB or H/A. BP now 147/89 P 99. New cuff, pt symptoms have resolved but did concern pt; pts husband is going to take her to [facility] in few mins. UC & ED precautions given and pt voiced understanding. Sending note to Dr Milinda Antis and [employer]

## 2023-01-14 NOTE — ED Notes (Signed)
Patient is being discharged from the Urgent Care and sent to the Emergency Department via pov . Per Dr Tracie Harrier, patient is in need of higher level of care due to presentation and complaint. Patient is aware and verbalizes understanding of plan of care.  Vitals:   01/14/23 1601  BP: (!) 166/86  Pulse: (!) 104  Resp: 18  Temp: 98.6 F (37 C)  SpO2: 98%

## 2023-01-14 NOTE — Telephone Encounter (Signed)
Aware- agree with that advisement  Will watch for correspondence

## 2023-01-14 NOTE — Telephone Encounter (Signed)
Pt called in and stated that she was sitting in her car and her vision was off and pt had tinging in her feet and hands pt would like a call back if possible

## 2023-01-14 NOTE — ED Triage Notes (Signed)
Around 1:00 pm today, patient was riding with spouse in car.  Vision changes included squigly lines and both hands and both feet were numb and tingling.  Patient reports nausea yesterday morning and today.    Reports "equilibrium is off a tiny bit".  States numbness has gone away.  Patient denies any pain

## 2023-01-15 NOTE — ED Notes (Signed)
Patient LWBS, despite being advised not to. Dragging OTF.

## 2023-01-22 ENCOUNTER — Telehealth: Payer: Self-pay | Admitting: Family Medicine

## 2023-01-22 MED ORDER — ALPRAZOLAM 0.5 MG PO TABS: 0.5000 mg | ORAL_TABLET | Freq: Two times a day (BID) | ORAL | 0 refills | Status: DC | PRN

## 2023-01-22 NOTE — Telephone Encounter (Signed)
Spoke to pt, told pt Dr. Milinda Antis called in meds .

## 2023-01-22 NOTE — Telephone Encounter (Signed)
Left VM letting pt know Rx sent 

## 2023-01-22 NOTE — Addendum Note (Signed)
Addended by: Roxy Manns A on: 01/22/2023 10:47 AM   Modules accepted: Orders

## 2023-01-22 NOTE — Telephone Encounter (Signed)
Patient called in and wanted to know if Dr. Milinda Antis could call send in 7 tablets of ALPRAZolam (XANAX) 0.5 MG tablet. She stated that she is getting ready to travel for the holidays and gets anxiety when away from home. Please advise. Thank you!

## 2023-01-22 NOTE — Telephone Encounter (Signed)
Done

## 2023-03-04 ENCOUNTER — Ambulatory Visit: Payer: Self-pay | Admitting: Family Medicine

## 2023-03-04 NOTE — Telephone Encounter (Signed)
Left message to return call to our office.  

## 2023-03-04 NOTE — Telephone Encounter (Signed)
Does need appointment with first available or UC  Agree with advisement that she declined  If fever /flank pain / worsening symptoms- recommend ER Thanks

## 2023-03-04 NOTE — Telephone Encounter (Signed)
  Chief Complaint: Pain with Urination Symptoms: dark colored urine, pain with urination, urgency Frequency: three days Pertinent Negatives: Patient denies fever Disposition: [] ED /[] Urgent Care (no appt availability in office) / [] Appointment(In office/virtual)/ []  [place] [facility]/ [] Home Care/ [x] Refused Recommended Disposition /[]  Mobile Bus/ []  Follow-up with PCP Additional Notes: patient called with concerns of dark colored urine, pain with urination and urgency. Patient states these are the same symptoms she had with a previous UTI. Urgency is intermittent. Patient endorses having had 4 episodes of painful urination today. Symptoms have been going on for three days. Per protocol, the recommendation would be Urgent Care. Patient refused disposition as she is unable to go to Urgent Care-taking care of her grandchildren currently and unable to be checked out. Patient verbalized understanding and all questions answered.   Copied from CRM [id number]. Topic: Clinical - Red Word Triage >> Mar 04, 2023  4:18 PM [place] wrote: Red Word that prompted transfer to Nurse Triage: Patient has possible UTI. Dark colored urine, painful when urinating. Started about 3 days ago. Reason for Disposition  [1] SEVERE pain with urination (e.g., excruciating) AND [2] not improved after 2 hours of pain medicine and Sitz bath  Answer Assessment - Initial Assessment Questions 1. SEVERITY: "How bad is the pain?"  (e.g., Scale 1-10; mild, moderate, or severe)   - MILD (1-3): complains slightly about urination hurting   - MODERATE (4-7): interferes with normal activities     - SEVERE (8-10): excruciating, unwilling or unable to urinate because of the pain      4 out of 10 2. FREQUENCY: "How many times have you had painful urination today?"      4 times today 3. PATTERN: "Is pain present every time you urinate or just sometimes?"      Just some times 4. ONSET: "When did the painful urination start?"       Three days ago 5. FEVER: "Do you have a fever?" If Yes, ask: "What is your temperature, how was it measured, and when did it start?"     No 6. PAST UTI: "Have you had a urine infection before?" If Yes, ask: "When was the last time?" and "What happened that time?"      Yes-last time was a couple of years ago 7. CAUSE: "What do you think is causing the painful urination?"  (e.g., UTI, scratch, Herpes sore)     UTI 8. OTHER SYMPTOMS: "Do you have any other symptoms?" (e.g., blood in urine, flank pain, genital sores, urgency, vaginal discharge)     Dark colored urine, urgency  Protocols used: Urination Pain - Female-A-AH

## 2023-04-22 ENCOUNTER — Other Ambulatory Visit: Payer: Self-pay | Admitting: Family Medicine

## 2023-04-22 DIAGNOSIS — Z1231 Encounter for screening mammogram for malignant neoplasm of breast: Secondary | ICD-10-CM

## 2023-05-26 ENCOUNTER — Ambulatory Visit
Admission: RE | Admit: 2023-05-26 | Discharge: 2023-05-26 | Disposition: A | Discharge status: Home / Self Care | Source: Ambulatory Visit | Attending: Family Medicine | Admitting: Family Medicine

## 2023-05-26 DIAGNOSIS — Z1231 Encounter for screening mammogram for malignant neoplasm of breast: Secondary | ICD-10-CM

## 2023-05-30 ENCOUNTER — Encounter: Payer: Self-pay | Admitting: Family Medicine

## 2023-06-27 ENCOUNTER — Other Ambulatory Visit: Payer: Self-pay | Admitting: Family Medicine

## 2023-06-29 NOTE — Telephone Encounter (Signed)
 Name of Medication: Xanax  Name of Pharmacy: [facility]/ [address] Last Fill or Written Date and Quantity: 01/22/23 # 7 tabs/ 0 refills  Last Office Visit and Type: CPE 08/03/22 Next Office Visit and Type: CPE 08/09/23

## 2023-07-23 ENCOUNTER — Other Ambulatory Visit: Payer: Self-pay | Admitting: Family Medicine

## 2023-08-01 ENCOUNTER — Telehealth: Payer: Self-pay | Admitting: Family Medicine

## 2023-08-01 DIAGNOSIS — K219 Gastro-esophageal reflux disease without esophagitis: Secondary | ICD-10-CM

## 2023-08-01 DIAGNOSIS — R7309 Other abnormal glucose: Secondary | ICD-10-CM

## 2023-08-01 DIAGNOSIS — F411 Generalized anxiety disorder: Secondary | ICD-10-CM

## 2023-08-01 DIAGNOSIS — E7800 Pure hypercholesterolemia, unspecified: Secondary | ICD-10-CM

## 2023-08-01 NOTE — Telephone Encounter (Signed)
-----   Message from Harlene Du sent at 07/19/2023  9:14 AM EDT ----- Regarding: Lab Mon 08/02/23 Hello,  Patient is coming in for CPE labs on Monday 08/02/23. Can we get orders please.   Thanks

## 2023-08-02 ENCOUNTER — Other Ambulatory Visit (INDEPENDENT_AMBULATORY_CARE_PROVIDER_SITE_OTHER): Payer: Medicare HMO

## 2023-08-02 ENCOUNTER — Ambulatory Visit: Payer: Self-pay | Admitting: Family Medicine

## 2023-08-02 DIAGNOSIS — E7800 Pure hypercholesterolemia, unspecified: Secondary | ICD-10-CM | POA: Diagnosis not present

## 2023-08-02 DIAGNOSIS — R7309 Other abnormal glucose: Secondary | ICD-10-CM

## 2023-08-02 DIAGNOSIS — F411 Generalized anxiety disorder: Secondary | ICD-10-CM

## 2023-08-02 LAB — COMPREHENSIVE METABOLIC PANEL WITH GFR
ALT: 16 U/L (ref 0–35)
AST: 19 U/L (ref 0–37)
Albumin: 4.3 g/dL (ref 3.5–5.2)
Alkaline Phosphatase: 67 U/L (ref 39–117)
BUN: 17 mg/dL (ref 6–23)
CO2: 29 meq/L (ref 19–32)
Calcium: 9.6 mg/dL (ref 8.4–10.5)
Chloride: 105 meq/L (ref 96–112)
Creatinine, Ser: 0.94 mg/dL (ref 0.40–1.20)
GFR: 58.93 mL/min — ABNORMAL LOW (ref >60.00)
Glucose, Bld: 91 mg/dL (ref 70–99)
Potassium: 4.5 meq/L (ref 3.5–5.1)
Sodium: 141 meq/L (ref 135–145)
Total Bilirubin: 0.5 mg/dL (ref 0.2–1.2)
Total Protein: 6.9 g/dL (ref 6.0–8.3)

## 2023-08-02 LAB — TSH: TSH: 2.8 u[IU]/mL (ref 0.35–5.50)

## 2023-08-02 LAB — LIPID PANEL
Cholesterol: 163 mg/dL (ref 0–200)
HDL: 50.6 mg/dL (ref >39.00)
LDL Cholesterol: 88 mg/dL (ref 0–99)
NonHDL: 112.5
Total CHOL/HDL Ratio: 3
Triglycerides: 122 mg/dL (ref 0.0–149.0)
VLDL: 24.4 mg/dL (ref 0.0–40.0)

## 2023-08-02 LAB — HEMOGLOBIN A1C: Hgb A1c MFr Bld: 6.1 % (ref 4.6–6.5)

## 2023-08-09 ENCOUNTER — Encounter: Payer: Self-pay | Admitting: Family Medicine

## 2023-08-09 ENCOUNTER — Ambulatory Visit (INDEPENDENT_AMBULATORY_CARE_PROVIDER_SITE_OTHER): Payer: Medicare HMO | Admitting: Family Medicine

## 2023-08-09 VITALS — BP 130/84 | HR 75 | Temp 97.6°F | Ht 64.5 in | Wt 158.1 lb

## 2023-08-09 DIAGNOSIS — K219 Gastro-esophageal reflux disease without esophagitis: Secondary | ICD-10-CM

## 2023-08-09 DIAGNOSIS — F411 Generalized anxiety disorder: Secondary | ICD-10-CM | POA: Diagnosis not present

## 2023-08-09 DIAGNOSIS — N951 Menopausal and female climacteric states: Secondary | ICD-10-CM | POA: Insufficient documentation

## 2023-08-09 DIAGNOSIS — R7303 Prediabetes: Secondary | ICD-10-CM | POA: Diagnosis not present

## 2023-08-09 DIAGNOSIS — E7800 Pure hypercholesterolemia, unspecified: Secondary | ICD-10-CM

## 2023-08-09 DIAGNOSIS — M85859 Other specified disorders of bone density and structure, unspecified thigh: Secondary | ICD-10-CM | POA: Diagnosis not present

## 2023-08-09 DIAGNOSIS — Z0000 Encounter for general adult medical examination without abnormal findings: Secondary | ICD-10-CM

## 2023-08-09 MED ORDER — ESTRADIOL 0.1 MG/GM VA CREA: TOPICAL_CREAM | VAGINAL | 1 refills | Status: AC

## 2023-08-09 NOTE — Assessment & Plan Note (Signed)
 Dexa 01/2022  No falls or fractures and taking D Discussed fall prevention, supplements and exercise for bone density    Encouraged to keep working on Runner, broadcasting/film/video

## 2023-08-09 NOTE — Assessment & Plan Note (Signed)
 Off ppi  Encouraged to watch diet for triggers

## 2023-08-09 NOTE — Assessment & Plan Note (Signed)
 Disc goals for lipids and reasons to control them Rev last labs with pt Rev low sat fat diet in detail Well controlled with atorvastatin  20 mg daily and diet   LDL of 88, overall stable

## 2023-08-09 NOTE — Assessment & Plan Note (Addendum)
 Lab Results  Component Value Date   HGBA1C 6.1 08/02/2023   disc imp of low glycemic diet and wt loss to prevent DM2   Will change her boost to a lower sugar alternative or water/increase fluids   Handout given

## 2023-08-09 NOTE — Patient Instructions (Addendum)
 You are due a tetanus shot Ask at the pharmacy fl  Stay active  Add some strength training to your routine, this is important for bone and brain health and can reduce your risk of falls and help your body use insulin properly and regulate weight  Light weights, exercise bands , and internet videos are a good way to start  Yoga (chair or regular), machines , floor exercises or a gym with machines are also good options    Your glucose is in the prediabetic range Try to get most of your carbohydrates from produce (with the exception of white potatoes) and whole grains Eat less bread/pasta/rice/snack foods/cereals/sweets and other items from the middle of the grocery store (processed carbs)  If you need the meal supplement shake - look at the label and choose one that is lower sugar and higher protein

## 2023-08-09 NOTE — Assessment & Plan Note (Addendum)
 Some dryness and pain with intercourse Prescription estrace  vaginal cream given to try -pea sized amt vaginally twice a week    Instructed to update if not helpful

## 2023-08-09 NOTE — Assessment & Plan Note (Signed)
 Reviewed health habits including diet and exercise and skin cancer prevention Reviewed appropriate screening tests for age  Also reviewed health mt list, fam hx and immunization status , as well as social and family history   See HPI Labs reviewed and ordered Health Maintenance  Topic Date Due   DTaP/Tdap/Td vaccine (3 - Tdap) 12/21/2023*   COVID-19 Vaccine (7 - 2024-25 season) 08/24/2025*   Flu Shot  09/10/2023   Mammogram  05/25/2024   Medicare Annual Wellness Visit  08/08/2024   Pneumococcal Vaccine for age over 71  Completed   DEXA scan (bone density measurement)  Completed   Hepatitis C Screening  Completed   Hepatitis B Vaccine  Aged Out   HPV Vaccine  Aged Out   Meningitis B Vaccine  Aged Out   Colon Cancer Screening  Discontinued   Zoster (Shingles) Vaccine  Discontinued  *Topic was postponed. The date shown is not the original due date.    Due for Td- will get at pharmacy  Discussed fall prevention, supplements and exercise for bone density  No falls or fracture  PHQ 3 due to altered sleeping / some fatigue but mood is good Encouraged more fluids/ water and diet low in added sugar

## 2023-08-09 NOTE — Progress Notes (Signed)
 Subjective:    Patient ID: Tabitha Carlson, female    DOB: September 02, 1945, 78 y.o.   MRN: [id number]  HPI  Here for health maintenance exam and to review chronic medical problems   Wt Readings from Last 3 Encounters:  08/09/23 158 lb 2 oz (71.7 kg)  12/21/22 158 lb (71.7 kg)  08/03/22 158 lb 2 oz (71.7 kg)   26.72 kg/m  Vitals:   08/09/23 0847  BP: 130/84  Pulse: 75  Temp: 97.6 F (36.4 C)  SpO2: 96%    Immunization History  Administered Date(s) Administered   Fluad Quad(high Dose 65+) 10/13/2018, 11/22/2021   Influenza Split 11/01/2010, 10/23/2011   Influenza Whole 10/10/2009   Influenza, High Dose Seasonal PF 10/23/2014, 11/11/2016, 10/27/2017, 10/26/2019, 10/10/2022   Influenza, Seasonal, Injecte, Preservative Fre 11/14/2015   Influenza,inj,Quad PF,6+ Mos 10/26/2012, 10/20/2013   PFIZER Comirnaty(Gray Top)Covid-19 Tri-Sucrose Vaccine 11/11/2021   PFIZER(Purple Top)SARS-COV-2 Vaccination 03/26/2019, 04/18/2019, 12/07/2019   Pfizer Covid-19 Vaccine Bivalent Booster 56yrs & up 01/12/2021   Pfizer(Comirnaty)Fall Seasonal Vaccine 12 years and older 11/09/2022   Pneumococcal Conjugate-13 06/04/2015   Pneumococcal Polysaccharide-23 10/20/2013   Td 03/04/2000, 03/20/2010   Zoster Recombinant(Shingrix) 07/10/2021   Zoster, Live 12/11/2014    There are no preventive care reminders to display for this patient.  Feeling pretty good  Busy with family    Tetanus shot 2012    Mammogram 05/2023  Self breast exam- no lumps   Gyn health Vaginal dryness/ pain with intercourse    Colon cancer screening -colonoscopy 12/2014   Bone health  Dexa 01/2022 osteopenia   Falls- none Fractures-none  Supplements - vitamin D , boost for her calcium  Last vitamin D  Lab Results  Component Value Date   VD25OH 49.33 05/11/2014    Exercise :  Floor exercises  Some weights  Some walking    Mood    08/09/2023    8:50 AM 12/21/2022    1:16 PM 08/03/2022    8:56 AM 12/17/2021     3:07 PM 07/31/2021    8:37 AM  Depression screen PHQ 2/9  Decreased Interest 0 0 0 0 0  Down, Depressed, Hopeless 0 0 0 0 0  PHQ - 2 Score 0 0 0 0 0  Altered sleeping 2  3 0   Tired, decreased energy 1  2 0   Change in appetite 0  0 0   Feeling bad or failure about yourself  0  0 0   Trouble concentrating 0  0 0   Moving slowly or fidgety/restless 0  0 0   Suicidal thoughts 0  0 0   PHQ-9 Score 3  5 0   Difficult doing work/chores Somewhat difficult  Somewhat difficult Not difficult at all       08/09/2023    8:50 AM 08/03/2022    8:56 AM  GAD 7 : Generalized Anxiety Score  Nervous, Anxious, on Edge 1 0  Control/stop worrying 0 0  Worry too much - different things 0 0  Trouble relaxing 0 0  Restless 0 0  Easily annoyed or irritable 0 0  Afraid - awful might happen 0 0  Total GAD 7 Score 1 0  Anxiety Difficulty Not difficult at all Not difficult at all     GAD Buspar  7.5 mg bid  Xanax  prn   Glucose  Lab Results  Component Value Date   HGBA1C 6.1 08/02/2023    Not a lot of sweets  Once in a while -limits it  Eats a lot of fish/ salmon     Hyperlipidemia Lab Results  Component Value Date   CHOL 163 08/02/2023   CHOL 166 07/27/2022   CHOL 159 07/22/2021   Lab Results  Component Value Date   HDL 50.60 08/02/2023   HDL 53.10 07/27/2022   HDL 55.20 07/22/2021   Lab Results  Component Value Date   LDLCALC 88 08/02/2023   LDLCALC 83 07/27/2022   LDLCALC 80 07/22/2021   Lab Results  Component Value Date   TRIG 122.0 08/02/2023   TRIG 153.0 (H) 07/27/2022   TRIG 115.0 07/22/2021   Lab Results  Component Value Date   CHOLHDL 3 08/02/2023   CHOLHDL 3 07/27/2022   CHOLHDL 3 07/22/2021   No results found for: LDLDIRECT  Atorvastatin  20 mg daily     Lab Results  Component Value Date   ALT 16 08/02/2023   AST 19 08/02/2023   ALKPHOS 67 08/02/2023   BILITOT 0.5 08/02/2023      Lab Results  Component Value Date   TSH 2.80 08/02/2023    Lab Results  Component Value Date   WBC 6.6 01/14/2023   HGB 14.1 01/14/2023   HCT 44.0 01/14/2023   MCV 92.2 01/14/2023   PLT 204 01/14/2023     Patient Active Problem List   Diagnosis Date Noted   Vaginal dryness, menopausal 08/09/2023   Prediabetes 08/03/2022   Estrogen deficiency 06/04/2015   Routine general medical examination at a health care facility 05/18/2015   Osteopenia 10/20/2013   Gastroparesis 01/02/2008   ALLERGIC RHINITIS 09/12/2007   COLONIC POLYPS 03/16/2007   GERD 03/16/2007   Hyperlipidemia 02/21/2007   GAD (generalized anxiety disorder) 02/21/2007   IBS 02/21/2007   DEGENERATIVE JOINT DISEASE 02/21/2007   Past Medical History:  Diagnosis Date   Allergic rhinitis    Allergy    See Chart   Anxiety    SeeChart   Arthritis    in back. Physical Therapy 2016   Blood donor    Cancer (HCC)    skin on back   Depression    DJD (degenerative joint disease)    Family history of ischemic heart disease    Gastroparesis    GERD (gastroesophageal reflux disease)    Hx of colonic polyps    Hypercholesteremia    Hyperlipidemia    IBS (irritable bowel syndrome)    Past Surgical History:  Procedure Laterality Date   ABDOMINAL HYSTERECTOMY  1985   TAH/BSO Age 13 DUB, pelvic pain   BREAST EXCISIONAL BIOPSY Left 1980, 1983   Benign Breast Biospy x 2   BREAST SURGERY  1980, 1983   CYST EXCISION  04/2011   Thigh   left breast biopsy     benign x 2   SKIN CANCER EXCISION     on back   SKIN SURGERY     removal of small skin on face   Social History   Tobacco Use   Smoking status: Never   Smokeless tobacco: Never  Vaping Use   Vaping status: Never Used  Substance Use Topics   Alcohol use: Never   Drug use: Never   Family History  Problem Relation Age of Onset   Leukemia Mother    Hyperlipidemia Sister    Other Sister        2 cardiac episodes   Breast cancer Maternal Aunt    Breast cancer Maternal Aunt    Colon cancer Neg Hx     Allergies  Allergen  Reactions   Aspirin     Unable to take a full aspirin--causes palpitations Tachycardia   Crab [Shellfish Allergy]     Rash from head to toe   Current Outpatient Medications on File Prior to Visit  Medication Sig Dispense Refill   acetaminophen (TYLENOL) 325 MG tablet Take 650 mg by mouth every 6 (six) hours as needed.     ALPRAZolam  (XANAX ) 0.5 MG tablet TAKE 1 TABLET (0.5 MG TOTAL) BY MOUTH 2 (TWO) TIMES DAILY AS NEEDED FOR ANXIETY OR SLEEP. 7 tablet 0   atorvastatin  (LIPITOR) 20 MG tablet TAKE 1 TABLET BY MOUTH EVERY DAY 90 tablet 0   busPIRone  (BUSPAR ) 15 MG tablet TAKE 1/2 TABLETS (7.5 MG TOTAL) BY MOUTH 2 (TWO) TIMES DAILY. 90 tablet 0   Casanthranol-Docusate Sodium 30-100 MG CAPS Take 2 capsules by mouth daily.     Cholecalciferol (VITAMIN D ) 2000 UNITS CAPS Take 1 capsule by mouth daily.     Ibuprofen (ADVIL PO) Take by mouth as needed.     Multiple Vitamins-Minerals (MULTIPLE VITAMINS/WOMENS PO) Take 1 tablet by mouth daily.     OVER THE COUNTER MEDICATION Fiber gummies     triamcinolone  cream (KENALOG ) 0.1 % Apply topically 2 (two) times daily. To affected area 30 g 0   No current facility-administered medications on file prior to visit.    Review of Systems  Constitutional:  Negative for activity change, appetite change, fatigue, fever and unexpected weight change.  HENT:  Negative for congestion, ear pain, rhinorrhea, sinus pressure and sore throat.   Eyes:  Negative for pain, redness and visual disturbance.  Respiratory:  Negative for cough, shortness of breath and wheezing.   Cardiovascular:  Negative for chest pain and palpitations.  Gastrointestinal:  Negative for abdominal pain, blood in stool, constipation and diarrhea.  Endocrine: Negative for polydipsia and polyuria.  Genitourinary:  Negative for dysuria, frequency and urgency.       Vaginal dryness Discomfort with intercourse   Musculoskeletal:  Negative for arthralgias, back pain and  myalgias.  Skin:  Negative for pallor and rash.  Allergic/Immunologic: Negative for environmental allergies.  Neurological:  Negative for dizziness, syncope and headaches.  Hematological:  Negative for adenopathy. Does not bruise/bleed easily.  Psychiatric/Behavioral:  Negative for decreased concentration and dysphoric mood. The patient is nervous/anxious.        Objective:   Physical Exam Constitutional:      General: She is not in acute distress.    Appearance: Normal appearance. She is well-developed and normal weight. She is not ill-appearing or diaphoretic.  HENT:     Head: Normocephalic and atraumatic.     Right Ear: Tympanic membrane, ear canal and external ear normal.     Left Ear: Tympanic membrane, ear canal and external ear normal.     Nose: Nose normal. No congestion.     Mouth/Throat:     Mouth: Mucous membranes are moist.     Pharynx: Oropharynx is clear. No posterior oropharyngeal erythema.   Eyes:     General: No scleral icterus.    Extraocular Movements: Extraocular movements intact.     Conjunctiva/sclera: Conjunctivae normal.     Pupils: Pupils are equal, round, and reactive to light.   Neck:     Thyroid : No thyromegaly.     Vascular: No carotid bruit or JVD.   Cardiovascular:     Rate and Rhythm: Normal rate and regular rhythm.     Pulses: Normal pulses.     Heart sounds: Normal  heart sounds.     No gallop.  Pulmonary:     Effort: Pulmonary effort is normal. No respiratory distress.     Breath sounds: Normal breath sounds. No wheezing.     Comments: Good air exch Chest:     Chest wall: No tenderness.  Abdominal:     General: Bowel sounds are normal. There is no distension or abdominal bruit.     Palpations: Abdomen is soft. There is no mass.     Tenderness: There is no abdominal tenderness.     Hernia: No hernia is present.  Genitourinary:    Comments: Breast exam: No mass, nodules, thickening, tenderness, bulging, retraction, inflamation, nipple  discharge or skin changes noted.  No axillary or clavicular LA.      Musculoskeletal:        General: No tenderness. Normal range of motion.     Cervical back: Normal range of motion and neck supple. No rigidity. No muscular tenderness.     Right lower leg: No edema.     Left lower leg: No edema.     Comments: No kyphosis   Lymphadenopathy:     Cervical: No cervical adenopathy.   Skin:    General: Skin is warm and dry.     Coloration: Skin is not pale.     Findings: No erythema or rash.     Comments: Solar lentigines diffusely    Neurological:     Mental Status: She is alert. Mental status is at baseline.     Cranial Nerves: No cranial nerve deficit.     Motor: No abnormal muscle tone.     Coordination: Coordination normal.     Gait: Gait normal.     Deep Tendon Reflexes: Reflexes are normal and symmetric. Reflexes normal.   Psychiatric:        Mood and Affect: Mood normal.        Cognition and Memory: Cognition and memory normal.           Assessment & Plan:   Problem List Items Addressed This Visit       Digestive   GERD   Off ppi  Encouraged to watch diet for triggers         Musculoskeletal and Integument   Osteopenia   Dexa 01/2022  No falls or fractures and taking D Discussed fall prevention, supplements and exercise for bone density    Encouraged to keep working on strength training         Genitourinary   Vaginal dryness, menopausal   Some dryness and pain with intercourse Prescription estrace  vaginal cream given to try -pea sized amt vaginally twice a week    Instructed to update if not helpful          Other   Routine general medical examination at a health care facility - Primary   Reviewed health habits including diet and exercise and skin cancer prevention Reviewed appropriate screening tests for age  Also reviewed health mt list, fam hx and immunization status , as well as social and family history   See HPI Labs reviewed and  ordered Health Maintenance  Topic Date Due   DTaP/Tdap/Td vaccine (3 - Tdap) 12/21/2023*   COVID-19 Vaccine (7 - 2024-25 season) 08/24/2025*   Flu Shot  09/10/2023   Mammogram  05/25/2024   Medicare Annual Wellness Visit  08/08/2024   Pneumococcal Vaccine for age over 4  Completed   DEXA scan (bone density measurement)  Completed   Hepatitis  C Screening  Completed   Hepatitis B Vaccine  Aged Out   HPV Vaccine  Aged Out   Meningitis B Vaccine  Aged Out   Colon Cancer Screening  Discontinued   Zoster (Shingles) Vaccine  Discontinued  *Topic was postponed. The date shown is not the original due date.    Due for Td- will get at pharmacy  Discussed fall prevention, supplements and exercise for bone density  No falls or fracture  PHQ 3 due to altered sleeping / some fatigue but mood is good Encouraged more fluids/ water and diet low in added sugar        Prediabetes   Lab Results  Component Value Date   HGBA1C 6.1 08/02/2023   disc imp of low glycemic diet and wt loss to prevent DM2   Will change her boost to a lower sugar alternative or water/increase fluids   Handout given       Hyperlipidemia   Disc goals for lipids and reasons to control them Rev last labs with pt Rev low sat fat diet in detail Well controlled with atorvastatin  20 mg daily and diet   LDL of 88, overall stable        GAD (generalized anxiety disorder)   Continues buspar  7.5 mg bid  Doing well overall   GAD7 score of 1   Xanax  prn   Encouraged self care and good exercise

## 2023-08-09 NOTE — Assessment & Plan Note (Signed)
 Continues buspar  7.5 mg bid  Doing well overall   GAD7 score of 1   Xanax  prn   Encouraged self care and good exercise

## 2023-10-20 ENCOUNTER — Other Ambulatory Visit: Payer: Self-pay | Admitting: Family Medicine

## 2023-11-04 DIAGNOSIS — L565 Disseminated superficial actinic porokeratosis (DSAP): Secondary | ICD-10-CM | POA: Diagnosis not present

## 2023-11-04 DIAGNOSIS — L905 Scar conditions and fibrosis of skin: Secondary | ICD-10-CM | POA: Diagnosis not present

## 2023-11-04 DIAGNOSIS — Z85828 Personal history of other malignant neoplasm of skin: Secondary | ICD-10-CM | POA: Diagnosis not present

## 2023-11-04 DIAGNOSIS — D225 Melanocytic nevi of trunk: Secondary | ICD-10-CM | POA: Diagnosis not present

## 2023-11-04 DIAGNOSIS — L821 Other seborrheic keratosis: Secondary | ICD-10-CM | POA: Diagnosis not present

## 2023-11-04 DIAGNOSIS — D2261 Melanocytic nevi of right upper limb, including shoulder: Secondary | ICD-10-CM | POA: Diagnosis not present

## 2023-11-04 DIAGNOSIS — L718 Other rosacea: Secondary | ICD-10-CM | POA: Diagnosis not present

## 2023-11-04 DIAGNOSIS — L570 Actinic keratosis: Secondary | ICD-10-CM | POA: Diagnosis not present

## 2023-11-04 DIAGNOSIS — D485 Neoplasm of uncertain behavior of skin: Secondary | ICD-10-CM | POA: Diagnosis not present

## 2023-11-04 DIAGNOSIS — D2262 Melanocytic nevi of left upper limb, including shoulder: Secondary | ICD-10-CM | POA: Diagnosis not present

## 2023-12-22 ENCOUNTER — Ambulatory Visit (INDEPENDENT_AMBULATORY_CARE_PROVIDER_SITE_OTHER): Payer: Medicare HMO

## 2023-12-22 VITALS — Ht 64.5 in | Wt 153.0 lb

## 2023-12-22 DIAGNOSIS — Z0000 Encounter for general adult medical examination without abnormal findings: Secondary | ICD-10-CM

## 2023-12-22 NOTE — Patient Instructions (Signed)
 Tabitha Carlson,  Thank you for taking the time for your Medicare Wellness Visit. I appreciate your continued commitment to your health goals. Please review the care plan we discussed, and feel free to reach out if I can assist you further.  Please note that Annual Wellness Visits do not include a physical exam. Some assessments may be limited, especially if the visit was conducted virtually. If needed, we may recommend an in-person follow-up with your provider.  Ongoing Care Seeing your primary care provider every 3 to 6 months helps us  monitor your health and provide consistent, personalized care.   Referrals If a referral was made during today's visit and you haven't received any updates within two weeks, please contact the referred provider directly to check on the status.  Recommended Screenings:  Health Maintenance  Topic Date Due   Breast Cancer Screening  05/25/2024   COVID-19 Vaccine (7 - 2025-26 season) 05/30/2024   Medicare Annual Wellness Visit  08/08/2024   DTaP/Tdap/Td vaccine (4 - Td or Tdap) 10/12/2033   Pneumococcal Vaccine for age over 77  Completed   Flu Shot  Completed   DEXA scan (bone density measurement)  Completed   Hepatitis C Screening  Completed   Meningitis B Vaccine  Aged Out   Colon Cancer Screening  Discontinued   Zoster (Shingles) Vaccine  Discontinued       12/22/2023    1:53 PM  Advanced Directives  Does Patient Have a Medical Advance Directive? No    Vision: Annual vision screenings are recommended for early detection of glaucoma, cataracts, and diabetic retinopathy. These exams can also reveal signs of chronic conditions such as diabetes and high blood pressure.  Dental: Annual dental screenings help detect early signs of oral cancer, gum disease, and other conditions linked to overall health, including heart disease and diabetes.

## 2023-12-22 NOTE — Progress Notes (Signed)
 Chief Complaint  Patient presents with   Medicare Wellness     Subjective:   Tabitha Carlson is a 78 y.o. female who presents for a Medicare Annual Wellness Visit.  Allergies (verified) Aspirin and Crab [shellfish allergy]   History: Past Medical History:  Diagnosis Date   Allergic rhinitis    Allergy    See Chart   Anxiety    SeeChart   Arthritis    in back. Physical Therapy 2016   Blood donor    Cancer (HCC)    skin on back   Depression    DJD (degenerative joint disease)    Family history of ischemic heart disease    Gastroparesis    GERD (gastroesophageal reflux disease)    Hx of colonic polyps    Hypercholesteremia    Hyperlipidemia    IBS (irritable bowel syndrome)    Past Surgical History:  Procedure Laterality Date   ABDOMINAL HYSTERECTOMY  1985   TAH/BSO Age 45 DUB, pelvic pain   BREAST EXCISIONAL BIOPSY Left 1980, 1983   Benign Breast Biospy x 2   BREAST SURGERY  1980, 1983   CYST EXCISION  04/2011   Thigh   left breast biopsy     benign x 2   SKIN CANCER EXCISION     on back   SKIN SURGERY     removal of small skin on face   Family History  Problem Relation Age of Onset   Leukemia Mother    Hyperlipidemia Sister    Other Sister        2 cardiac episodes   Breast cancer Maternal Aunt    Breast cancer Maternal Aunt    Colon cancer Neg Hx    Social History   Occupational History   Occupation: housewife  Tobacco Use   Smoking status: Never   Smokeless tobacco: Never  Vaping Use   Vaping status: Never Used  Substance and Sexual Activity   Alcohol use: Never   Drug use: Never   Sexual activity: Yes    Partners: Male    Birth control/protection: Surgical    Comment: Hysterectomy   Tobacco Counseling Counseling given: Not Answered  SDOH Screenings   Food Insecurity: No Food Insecurity (12/22/2023)  Housing: Unknown (12/22/2023)  Transportation Needs: No Transportation Needs (12/22/2023)  Utilities: Not At Risk (12/22/2023)   Alcohol Screen: Low Risk  (12/22/2023)  Depression (PHQ2-9): Low Risk  (12/22/2023)  Financial Resource Strain: Low Risk  (12/22/2023)  Physical Activity: Insufficiently Active (12/22/2023)  Social Connections: Moderately Integrated (12/22/2023)  Stress: No Stress Concern Present (12/22/2023)  Tobacco Use: Low Risk  (12/22/2023)  Health Literacy: Adequate Health Literacy (12/22/2023)   See flowsheets for full screening details  Depression Screen PHQ 2 & 9 Depression Scale- Over the past 2 weeks, how often have you been bothered by any of the following problems? Little interest or pleasure in doing things: 0 Feeling down, depressed, or hopeless (PHQ Adolescent also includes...irritable): 0 PHQ-2 Total Score: 0 Trouble falling or staying asleep, or sleeping too much: 2 Feeling tired or having little energy: 1 Poor appetite or overeating (PHQ Adolescent also includes...weight loss): 0 Feeling bad about yourself - or that you are a failure or have let yourself or your family down: 0 Trouble concentrating on things, such as reading the newspaper or watching television (PHQ Adolescent also includes...like school work): 0 Moving or speaking so slowly that other people could have noticed. Or the opposite - being so fidgety or restless  that you have been moving around a lot more than usual: 0 Thoughts that you would be better off dead, or of hurting yourself in some way: 0 PHQ-9 Total Score: 3 If you checked off any problems, how difficult have these problems made it for you to do your work, take care of things at home, or get along with other people?: Somewhat difficult     Goals Addressed             This Visit's Progress    COMPLETED: DIET - EAT MORE FRUITS AND VEGETABLES       I would like to continue eating health;greens and salads       COMPLETED: Increase physical activity       Starting 07/19/2018, I will continue to walk at 60 minutes daily.      COMPLETED: Patient Stated        07/20/2019, I will continue to do leg exercises everyday for 30 minutes.     COMPLETED: Patient Stated       08/07/2020, I will continue to do weights and step aerobics daily for 1 hour.       Visit info / Clinical Intake: Medicare Wellness Visit Type:: Subsequent Annual Wellness Visit Persons participating in visit:: patient Medicare Wellness Visit Mode:: Telephone If telephone:: video declined Because this visit was a virtual/telehealth visit:: unable to obtan vitals due to lack of equipment If Telephone or Video please confirm:: I discussed the limitations of evaluation and management by telemedicine Patient Location:: home Provider Location:: clinic Information given by:: patient Interpreter Needed?: No Pre-visit prep was completed: yes AWV questionnaire completed by patient prior to visit?: no Living arrangements:: lives with spouse/significant other Patient's Overall Health Status Rating: very good Typical amount of pain: none Does pain affect daily life?: no Are you currently prescribed opioids?: no  Dietary Habits and Nutritional Risks How many meals a day?: 2 Eats fruit and vegetables daily?: yes Most meals are obtained by: preparing own meals In the last 2 weeks, have you had any of the following?: none Diabetic:: no  Functional Status Activities of Daily Living (to include ambulation/medication): Independent Ambulation: Independent Medication Administration: Independent Home Management: Independent Manage your own finances?: yes Primary transportation is: driving Concerns about vision?: no *vision screening is required for WTM* Concerns about hearing?: no  Fall Screening Falls in the past year?: 0 Number of falls in past year: 0 Was there an injury with Fall?: 0 Fall Risk Category Calculator: 0 Patient Fall Risk Level: Low Fall Risk  Fall Risk Patient at Risk for Falls Due to: No Fall Risks Fall risk Follow up: Education provided; Falls prevention  discussed  Home and Transportation Safety: All rugs have non-skid backing?: N/A, no rugs All stairs or steps have railings?: N/A, no stairs Grab bars in the bathtub or shower?: (!) no Have non-skid surface in bathtub or shower?: (!) no Good home lighting?: yes Regular seat belt use?: yes Hospital stays in the last year:: no  Cognitive Assessment Difficulty concentrating, remembering, or making decisions? : no Will 6CIT or Mini Cog be Completed: yes What year is it?: 0 points What month is it?: 0 points Give patient an address phrase to remember (5 components): [address] [place]  About what time is it?: 0 points Count backwards from 20 to 1: 0 points Say the months of the year in reverse: 0 points Repeat the address phrase from earlier: 0 points 6 CIT Score: 0 points  Advance Directives (For Healthcare)  Does Patient Have a Medical Advance Directive?: No  Reviewed/Updated  Reviewed/Updated: Reviewed All (Medical, Surgical, Family, Medications, Allergies, Care Teams, Patient Goals)      Objective:    Today's Vitals   12/22/23 1342  Weight: 153 lb (69.4 kg)  Height: 5' 4.5 (1.638 m)   Body mass index is 25.86 kg/m.  Current Medications (verified) Outpatient Encounter Medications as of 12/22/2023  Medication Sig   acetaminophen (TYLENOL) 325 MG tablet Take 650 mg by mouth every 6 (six) hours as needed.   ALPRAZolam  (XANAX ) 0.5 MG tablet TAKE 1 TABLET (0.5 MG TOTAL) BY MOUTH 2 (TWO) TIMES DAILY AS NEEDED FOR ANXIETY OR SLEEP.   atorvastatin  (LIPITOR) 20 MG tablet TAKE 1 TABLET BY MOUTH EVERY DAY   busPIRone  (BUSPAR ) 15 MG tablet TAKE 1/2 TABLETS (7.5 MG TOTAL) BY MOUTH 2 (TWO) TIMES DAILY.   Casanthranol-Docusate Sodium 30-100 MG CAPS Take 2 capsules by mouth daily.   Cholecalciferol (VITAMIN D ) 2000 UNITS CAPS Take 1 capsule by mouth daily.   estradiol  (ESTRACE  VAGINAL) 0.1 MG/GM vaginal cream Place pea sized amount in vagina twice weekly   Ibuprofen (ADVIL  PO) Take by mouth as needed.   Multiple Vitamins-Minerals (MULTIPLE VITAMINS/WOMENS PO) Take 1 tablet by mouth daily.   OVER THE COUNTER MEDICATION Fiber gummies   triamcinolone  cream (KENALOG ) 0.1 % Apply topically 2 (two) times daily. To affected area   No facility-administered encounter medications on file as of 12/22/2023.   Hearing/Vision screen Vision Screening - Comments:: UTD w/visits to [facility] Immunizations and Health Maintenance Health Maintenance  Topic Date Due   Mammogram  05/25/2024   COVID-19 Vaccine (7 - 2025-26 season) 05/30/2024   Medicare Annual Wellness (AWV)  12/21/2024   DTaP/Tdap/Td (4 - Td or Tdap) 10/12/2033   Pneumococcal Vaccine: 50+ Years  Completed   Influenza Vaccine  Completed   DEXA SCAN  Completed   Hepatitis C Screening  Completed   Meningococcal B Vaccine  Aged Out   Colonoscopy  Discontinued   Zoster Vaccines- Shingrix  Discontinued       Assessment/Plan:  This is a routine wellness examination for Maurice.  Patient Care Team: Tower, Laine LABOR, MD as PCP - General (Family Medicine) Geraldene Loving, MD as Consulting Physician (Ophthalmology) Rodgers Barnie RAMAN, CNM as Referring Physician (Certified Nurse Midwife)  I have personally reviewed and noted the following in the patient's chart:   Medical and social history Use of alcohol, tobacco or illicit drugs  Current medications and supplements including opioid prescriptions. Functional ability and status Nutritional status Physical activity Advanced directives List of other physicians Hospitalizations, surgeries, and ER visits in previous 12 months Vitals Screenings to include cognitive, depression, and falls Referrals and appointments  No orders of the defined types were placed in this encounter.  In addition, I have reviewed and discussed with patient certain preventive protocols, quality metrics, and best practice recommendations. A written personalized care plan  for preventive services as well as general preventive health recommendations were provided to patient.   Erminio LITTIE Saris, LPN   88/87/7974   Return in 1 year (on 12/21/2024). Pt declines to come in next year :states she will align visit in 2027 with CPE (one office visit)  After Visit Summary: (MyChart) Due to this being a telephonic visit, the after visit summary with patients personalized plan was offered to patient via MyChart   Nurse Notes: Pt has no concerns or questions. AWV made for one year

## 2024-01-01 ENCOUNTER — Other Ambulatory Visit: Payer: Self-pay | Admitting: Family Medicine

## 2024-01-04 DIAGNOSIS — H5213 Myopia, bilateral: Secondary | ICD-10-CM | POA: Diagnosis not present

## 2024-01-04 DIAGNOSIS — H2513 Age-related nuclear cataract, bilateral: Secondary | ICD-10-CM | POA: Diagnosis not present

## 2024-01-04 NOTE — Telephone Encounter (Signed)
 Name of Medication: Xanax  Name of Pharmacy: [facility]/ [address] Last Fill or Written Date and Quantity: 06/29/23 # 7 tabs/ 0 refills  Last Office Visit and Type: CPE 08/09/23 Next Office Visit and Type: Acute appt 01/05/24

## 2024-01-05 ENCOUNTER — Ambulatory Visit (INDEPENDENT_AMBULATORY_CARE_PROVIDER_SITE_OTHER): Admitting: Family Medicine

## 2024-01-05 ENCOUNTER — Ambulatory Visit: Payer: Self-pay | Admitting: Family Medicine

## 2024-01-05 ENCOUNTER — Encounter: Payer: Self-pay | Admitting: Family Medicine

## 2024-01-05 ENCOUNTER — Ambulatory Visit
Admission: RE | Admit: 2024-01-05 | Discharge: 2024-01-05 | Disposition: A | Source: Ambulatory Visit | Attending: Family Medicine | Admitting: Family Medicine

## 2024-01-05 VITALS — BP 135/80 | HR 86 | Temp 98.3°F | Ht 64.5 in | Wt 154.5 lb

## 2024-01-05 DIAGNOSIS — W19XXXA Unspecified fall, initial encounter: Secondary | ICD-10-CM

## 2024-01-05 DIAGNOSIS — S299XXA Unspecified injury of thorax, initial encounter: Secondary | ICD-10-CM

## 2024-01-05 DIAGNOSIS — S0990XA Unspecified injury of head, initial encounter: Secondary | ICD-10-CM

## 2024-01-05 DIAGNOSIS — Y92009 Unspecified place in unspecified non-institutional (private) residence as the place of occurrence of the external cause: Secondary | ICD-10-CM | POA: Diagnosis not present

## 2024-01-05 NOTE — Assessment & Plan Note (Addendum)
 Fell 2 d ago striking anterior chest on a table Bruising and soreness in dependent breast areas  Tenderness over lower anterior ribs  Good air exch and able to take deep breath , full rom of UE and LEs   Cxr /rib films ordered : no fractures or abnormalities   Recommend deep breaths to avoid atelectasis Warm/cool compress Analgesics prn  Update if not starting to improve in a week or if worsening ' Call back and Er precautions noted in detail today   I personally spent a total of 30 minutes in the care of the patient today including preparing to see the patient, getting/reviewing separately obtained history, performing a medically appropriate exam/evaluation, counseling and educating, placing orders, and documenting clinical information in the EHR.

## 2024-01-05 NOTE — Patient Instructions (Addendum)
 Watch for headache/dizziness/ nausea /personality change or other signs of concussion   Call if something changes   Xray of chest/ribs today  We will reach out with results   Warm compress on painful areas  Over the counter pain medicines are ok   Keep us  posted

## 2024-01-05 NOTE — Progress Notes (Signed)
 Subjective:    Patient ID: Tabitha Carlson, female    DOB: [birth year], 78 y.o.   MRN: [id number]  HPI  Wt Readings from Last 3 Encounters:  01/05/24 154 lb 8 oz (70.1 kg)  12/22/23 153 lb (69.4 kg)  08/09/23 158 lb 2 oz (71.7 kg)   26.11 kg/m  Vitals:   01/05/24 0757 01/05/24 0819  BP: (!) 166/90 135/80  Pulse: 86   Temp: 98.3 F (36.8 C)   SpO2: 97%     Pt presents with injuries from a fall   Fell Monday pm  Hit a table Bruised her chest   Wearing slippers outside  Slipped on a step  Fell forward  Hit side of face right on bricks Chest/ribs hit table -under breasts   No LOC Able to get up with husband's help Sat down/lay down   No headache or dizziness No more nausea than usual  Does have sore spot      Over the counter Tylenol Advil Vics rub on sore areas   Had her regular eye exam yesterday - exam was normal    Imaging today DG Ribs Unilateral Right Result Date: 01/05/2024 EXAM: 2 VIEW(S) Xray of the Right Ribs 01/05/2024 08:35:26 AM COMPARISON: None available. CLINICAL HISTORY: Rib injury from fall - bilateral lower anterior with bruising FINDINGS: BONES: No acute displaced rib fracture. LUNGS AND PLEURA: Visualized lungs demonstrate no acute abnormality. IMPRESSION: 1. No acute displaced rib fracture. Electronically signed by: Evalene Coho MD 01/05/2024 09:06 AM EST RP Workstation: [id number]   DG Ribs Unilateral Left Result Date: 01/05/2024 EXAM: XR VIEW(S) XRAY OF THE LEFT RIBS 01/05/2024 08:35:26 AM COMPARISON: None available. CLINICAL HISTORY: Rib injury with fall, bilateral anterior with bruising. FINDINGS: BONES: No acute displaced rib fracture. LUNGS AND PLEURA: Visualized lungs demonstrate no acute abnormality. IMPRESSION: 1. No acute displaced rib fracture. Electronically signed by: Evalene Coho MD 01/05/2024 09:05 AM EST RP Workstation: [id number]   DG Chest 2 View Result Date: 01/05/2024 EXAM: 2 VIEW(S) XRAY OF THE CHEST  01/05/2024 08:35:26 AM COMPARISON: PA and lateral radiographs of the chest dated 03/26/2011. There is mild dextroscoliosis of the thoracolumbar spine. CLINICAL HISTORY: injury with fall / anterior lower rib pain both sides with bruising r/o fx FINDINGS: LUNGS AND PLEURA: No focal pulmonary opacity. No pleural effusion. No pneumothorax. HEART AND MEDIASTINUM: Calcified aorta. No acute abnormality of the cardiac and mediastinal silhouettes. BONES AND SOFT TISSUES: Thoracic degenerative changes. Mild dextroscoliosis of the thoracolumbar spine. IMPRESSION: 1. No acute process. Electronically signed by: Evalene Coho MD 01/05/2024 09:05 AM EST RP Workstation: [id number]      Patient Active Problem List   Diagnosis Date Noted   Chest wall injury 01/05/2024   Head injury 01/05/2024   Fall at home 01/05/2024   Vaginal dryness, menopausal 08/09/2023   Prediabetes 08/03/2022   Estrogen deficiency 06/04/2015   Routine general medical examination at a health care facility 05/18/2015   Osteopenia 10/20/2013   Gastroparesis 01/02/2008   ALLERGIC RHINITIS 09/12/2007   COLONIC POLYPS 03/16/2007   GERD 03/16/2007   Hyperlipidemia 02/21/2007   GAD (generalized anxiety disorder) 02/21/2007   IBS 02/21/2007   DEGENERATIVE JOINT DISEASE 02/21/2007   Past Medical History:  Diagnosis Date   Allergic rhinitis    Allergy    See Chart   Anxiety    SeeChart   Arthritis    in back. Physical Therapy 2016   Blood donor    Cancer (HCC)    skin on back  Depression    DJD (degenerative joint disease)    Family history of ischemic heart disease    Gastroparesis    GERD (gastroesophageal reflux disease)    Hx of colonic polyps    Hypercholesteremia    Hyperlipidemia    IBS (irritable bowel syndrome)    Past Surgical History:  Procedure Laterality Date   ABDOMINAL HYSTERECTOMY  1985   TAH/BSO Age 54 DUB, pelvic pain   BREAST EXCISIONAL BIOPSY Left 1980, 1983   Benign Breast Biospy x 2   BREAST  SURGERY  1980, 1983   CYST EXCISION  04/2011   Thigh   left breast biopsy     benign x 2   SKIN CANCER EXCISION     on back   SKIN SURGERY     removal of small skin on face   Social History   Tobacco Use   Smoking status: Never   Smokeless tobacco: Never  Vaping Use   Vaping status: Never Used  Substance Use Topics   Alcohol use: Never   Drug use: Never   Family History  Problem Relation Age of Onset   Leukemia Mother    Hyperlipidemia Sister    Other Sister        2 cardiac episodes   Breast cancer Maternal Aunt    Breast cancer Maternal Aunt    Colon cancer Neg Hx    Allergies  Allergen Reactions   Aspirin     Unable to take a full aspirin--causes palpitations Tachycardia   Crab [Shellfish Allergy]     Rash from head to toe   Current Outpatient Medications on File Prior to Visit  Medication Sig Dispense Refill   acetaminophen (TYLENOL) 325 MG tablet Take 650 mg by mouth every 6 (six) hours as needed.     ALPRAZolam  (XANAX ) 0.5 MG tablet TAKE 1 TABLET (0.5 MG TOTAL) BY MOUTH 2 (TWO) TIMES DAILY AS NEEDED FOR ANXIETY OR SLEEP. 7 tablet 0   atorvastatin  (LIPITOR) 20 MG tablet TAKE 1 TABLET BY MOUTH EVERY DAY 90 tablet 2   busPIRone  (BUSPAR ) 15 MG tablet TAKE 1/2 TABLETS (7.5 MG TOTAL) BY MOUTH 2 (TWO) TIMES DAILY. 90 tablet 2   Casanthranol-Docusate Sodium 30-100 MG CAPS Take 2 capsules by mouth daily.     Cholecalciferol (VITAMIN D ) 2000 UNITS CAPS Take 1 capsule by mouth daily.     estradiol  (ESTRACE  VAGINAL) 0.1 MG/GM vaginal cream Place pea sized amount in vagina twice weekly 42.5 g 1   Ibuprofen (ADVIL PO) Take by mouth as needed.     Multiple Vitamins-Minerals (MULTIPLE VITAMINS/WOMENS PO) Take 1 tablet by mouth daily.     OVER THE COUNTER MEDICATION Fiber gummies     triamcinolone  cream (KENALOG ) 0.1 % Apply topically 2 (two) times daily. To affected area 30 g 0   No current facility-administered medications on file prior to visit.    Review of Systems   Constitutional:  Negative for activity change, appetite change, fatigue, fever and unexpected weight change.  HENT:  Negative for congestion, ear pain, rhinorrhea, sinus pressure and sore throat.   Eyes:  Negative for pain, redness and visual disturbance.  Respiratory:  Negative for cough, shortness of breath and wheezing.   Cardiovascular:  Negative for chest pain and palpitations.  Gastrointestinal:  Negative for abdominal pain, blood in stool, constipation and diarrhea.  Endocrine: Negative for polydipsia and polyuria.  Genitourinary:  Negative for dysuria, frequency and urgency.  Musculoskeletal:  Positive for myalgias. Negative for arthralgias,  back pain and joint swelling.       Chest wall pain / anterior   Skin:  Negative for pallor and rash.       Bruises   Allergic/Immunologic: Negative for environmental allergies.  Neurological:  Negative for dizziness, tremors, seizures, syncope, facial asymmetry, speech difficulty, weakness, light-headedness, numbness and headaches.  Hematological:  Negative for adenopathy. Does not bruise/bleed easily.  Psychiatric/Behavioral:  Negative for decreased concentration and dysphoric mood. The patient is not nervous/anxious.        Objective:   Physical Exam Constitutional:      General: She is not in acute distress.    Appearance: Normal appearance. She is well-developed and normal weight. She is not ill-appearing or diaphoretic.  HENT:     Head: Normocephalic and atraumatic.     Comments: Slight tenderness and bruise on right upper face       Right Ear: Tympanic membrane, ear canal and external ear normal.     Left Ear: Tympanic membrane, ear canal and external ear normal.     Nose: Nose normal. No congestion or rhinorrhea.     Mouth/Throat:     Mouth: Mucous membranes are moist.     Pharynx: Oropharynx is clear. No oropharyngeal exudate.  Eyes:     General: No scleral icterus.       Right eye: No discharge.        Left eye: No  discharge.     Conjunctiva/sclera: Conjunctivae normal.     Pupils: Pupils are equal, round, and reactive to light.     Comments: No nystagmus  Neck:     Thyroid : No thyromegaly.     Vascular: No carotid bruit or JVD.     Trachea: No tracheal deviation.  Cardiovascular:     Rate and Rhythm: Normal rate and regular rhythm.     Heart sounds: Normal heart sounds. No murmur heard. Pulmonary:     Effort: Pulmonary effort is normal. No respiratory distress.     Breath sounds: Normal breath sounds. No stridor. No wheezing, rhonchi or rales.     Comments: Lower anterior chest wall tenderness No step off or crepitus   Bruising in dependent breasts  Chest:     Chest wall: Tenderness present.  Abdominal:     General: Bowel sounds are normal. There is no distension.     Palpations: Abdomen is soft. There is no mass.     Tenderness: There is no abdominal tenderness. There is no right CVA tenderness or left CVA tenderness.  Genitourinary:    Comments: Ecchymosis in dependent breasts  Musculoskeletal:        General: No tenderness.     Cervical back: Full passive range of motion without pain, normal range of motion and neck supple.     Comments: Rib tenderness bilateral   No spine tenderness  Bruise on right shoulder-normal rom shoulder Bruise on left wrist-normal rom wrists and hands   Lymphadenopathy:     Cervical: No cervical adenopathy.  Skin:    General: Skin is warm and dry.     Coloration: Skin is not pale.     Findings: No rash.     Comments: Small (1 cm) healed linear abrasion on right upper face   Neurological:     Mental Status: She is alert and oriented to person, place, and time.     Cranial Nerves: No cranial nerve deficit, dysarthria or facial asymmetry.     Sensory: Sensation is intact. No sensory deficit.  Motor: No weakness, tremor, atrophy or abnormal muscle tone.     Coordination: Coordination normal.     Gait: Gait normal.     Deep Tendon Reflexes: Reflexes  are normal and symmetric. Reflexes normal.     Comments: No focal cerebellar signs   Psychiatric:        Mood and Affect: Mood normal.        Behavior: Behavior normal.        Thought Content: Thought content normal.           Assessment & Plan:   Problem List Items Addressed This Visit       Other   Head injury   Fall 2 d ago- struck right side of face and head on concrete  Small abrasion /healed  Some contusion No signs and symptoms of concussion at all  Reassuring exam with no neurologic changes  Had routine eye exam yesterday-also ok  Cold compress prn   Discussed in detail signs and symptoms of concussion to watch for  Handout given  Call back and Er precautions noted in detail today        Fall at home   Trip and fall in bedroom slippers on outside steps  Injured chest wall/ right face and some contusions on arms   Reassuring exam , imaging ordered for contused areas  Discussed fall prevention in detail today         Chest wall injury - Primary   Fell 2 d ago striking anterior chest on a table Bruising and soreness in dependent breast areas  Tenderness over lower anterior ribs  Good air exch and able to take deep breath , full rom of UE and LEs   Cxr /rib films ordered : no fractures or abnormalities   Recommend deep breaths to avoid atelectasis Warm/cool compress Analgesics prn  Update if not starting to improve in a week or if worsening ' Call back and Er precautions noted in detail today        Relevant Orders   DG Chest 2 View (Completed)   DG Ribs Unilateral Right (Completed)   DG Ribs Unilateral Left (Completed)

## 2024-01-05 NOTE — Assessment & Plan Note (Signed)
 Trip and fall in bedroom slippers on outside steps  Injured chest wall/ right face and some contusions on arms   Reassuring exam , imaging ordered for contused areas  Discussed fall prevention in detail today

## 2024-01-05 NOTE — Assessment & Plan Note (Addendum)
 Fall 2 d ago- struck right side of face and head on concrete  Small abrasion /healed  Some contusion No signs and symptoms of concussion at all  Reassuring exam with no neurologic changes  Had routine eye exam yesterday-also ok  Cold compress prn   Discussed in detail signs and symptoms of concussion to watch for  Handout given  Call back and Er precautions noted in detail today

## 2024-08-02 ENCOUNTER — Other Ambulatory Visit

## 2024-08-09 ENCOUNTER — Encounter: Admitting: Family Medicine
